# Patient Record
Sex: Male | Born: 1935 | Race: White | Hispanic: No | Marital: Married | Smoking: Never smoker
Health system: Southern US, Community
[De-identification: ages and names within clinical notes are randomized; demographics above are authoritative.]

## PROBLEM LIST (undated history)

## (undated) DIAGNOSIS — S22009A Unspecified fracture of unspecified thoracic vertebra, initial encounter for closed fracture: Secondary | ICD-10-CM

## (undated) DIAGNOSIS — I517 Cardiomegaly: Secondary | ICD-10-CM

## (undated) DIAGNOSIS — I272 Pulmonary hypertension, unspecified: Secondary | ICD-10-CM

## (undated) DIAGNOSIS — R251 Tremor, unspecified: Secondary | ICD-10-CM

## (undated) DIAGNOSIS — I10 Essential (primary) hypertension: Secondary | ICD-10-CM

## (undated) DIAGNOSIS — K2981 Duodenitis with bleeding: Secondary | ICD-10-CM

## (undated) DIAGNOSIS — Z7901 Long term (current) use of anticoagulants: Secondary | ICD-10-CM

## (undated) DIAGNOSIS — IMO0002 Reserved for concepts with insufficient information to code with codable children: Secondary | ICD-10-CM

## (undated) DIAGNOSIS — S129XXA Fracture of neck, unspecified, initial encounter: Secondary | ICD-10-CM

## (undated) DIAGNOSIS — S064X9A Epidural hemorrhage with loss of consciousness of unspecified duration, initial encounter: Secondary | ICD-10-CM

## (undated) DIAGNOSIS — K8020 Calculus of gallbladder without cholecystitis without obstruction: Secondary | ICD-10-CM

## (undated) DIAGNOSIS — I5022 Chronic systolic (congestive) heart failure: Secondary | ICD-10-CM

## (undated) DIAGNOSIS — M1990 Unspecified osteoarthritis, unspecified site: Secondary | ICD-10-CM

## (undated) DIAGNOSIS — K529 Noninfective gastroenteritis and colitis, unspecified: Secondary | ICD-10-CM

## (undated) DIAGNOSIS — I4891 Unspecified atrial fibrillation: Secondary | ICD-10-CM

## (undated) DIAGNOSIS — Z95810 Presence of automatic (implantable) cardiac defibrillator: Secondary | ICD-10-CM

## (undated) HISTORY — PX: NECK SURGERY: SHX720

## (undated) HISTORY — DX: Unspecified osteoarthritis, unspecified site: M19.90

## (undated) HISTORY — PX: PROSTATECTOMY: SHX69

## (undated) HISTORY — DX: Reserved for concepts with insufficient information to code with codable children: IMO0002

## (undated) HISTORY — DX: Essential (primary) hypertension: I10

## (undated) HISTORY — DX: Noninfective gastroenteritis and colitis, unspecified: K52.9

## (undated) HISTORY — PX: COLONOSCOPY: SHX174

---

## 2005-08-28 HISTORY — PX: CARDIAC DEFIBRILLATOR PLACEMENT: SHX171

## 2005-11-07 ENCOUNTER — Encounter: Admission: RE | Admit: 2005-11-07 | Discharge: 2005-11-07 | Payer: Self-pay | Admitting: Orthopaedic Surgery

## 2005-11-15 ENCOUNTER — Encounter: Admission: RE | Admit: 2005-11-15 | Discharge: 2005-11-15 | Payer: Self-pay | Admitting: Orthopaedic Surgery

## 2007-09-16 ENCOUNTER — Ambulatory Visit: Payer: Self-pay | Admitting: Cardiology

## 2013-04-03 ENCOUNTER — Encounter (INDEPENDENT_AMBULATORY_CARE_PROVIDER_SITE_OTHER): Payer: Self-pay | Admitting: *Deleted

## 2013-06-02 ENCOUNTER — Encounter (INDEPENDENT_AMBULATORY_CARE_PROVIDER_SITE_OTHER): Payer: Self-pay | Admitting: Internal Medicine

## 2013-06-02 ENCOUNTER — Ambulatory Visit (INDEPENDENT_AMBULATORY_CARE_PROVIDER_SITE_OTHER): Payer: Medicare Other | Admitting: Internal Medicine

## 2013-06-02 VITALS — BP 114/70 | HR 74 | Temp 96.0°F | Resp 16 | Ht 73.0 in | Wt 180.6 lb

## 2013-06-02 DIAGNOSIS — R197 Diarrhea, unspecified: Secondary | ICD-10-CM

## 2013-06-02 DIAGNOSIS — K529 Noninfective gastroenteritis and colitis, unspecified: Secondary | ICD-10-CM | POA: Insufficient documentation

## 2013-06-02 MED ORDER — LOPERAMIDE HCL 2 MG PO TABS: 2.0000 mg | ORAL_TABLET | Freq: Every day | ORAL | Status: DC

## 2013-06-02 NOTE — Progress Notes (Signed)
Presenting complaint;  Chronic diarrhea.  History of present illness;  Patient's age-year-old Caucasian male who is referred through courtesy of Dr. Sherryll Burger for evaluation of chronic diarrhea. He states he's had this diarrhea for more than 5 years. Every now and then he has performed or normal stool like he had 12 days ago. On most days he is 3 loose stools. He has urgency. He has had at least 3-4 accidents within the last several months. He also has nocturnal bowel movements. He passes stool when he gets up some nights to empty his bladder. He has good appetite and his weight has been stable. He did try probiotic while back and it did not make any difference. His weight has been stable. He has not taken any antibiotics recently. Review of the systems is positive for chronic low back pain.  Current Medications: Current Outpatient Prescriptions  Medication Sig Dispense Refill  . aspirin 81 MG tablet Take 81 mg by mouth daily.      . benazepril (LOTENSIN) 40 MG tablet Take 40 mg by mouth daily.      Marland Kitchen DIGOXIN PO Take 250 mcg by mouth daily.      Marland Kitchen escitalopram (LEXAPRO) 20 MG tablet Take 20 mg by mouth daily.      . metoprolol succinate (TOPROL-XL) 50 MG 24 hr tablet Take 50 mg by mouth daily. Take with or immediately following a meal.      . spironolactone (ALDACTONE) 25 MG tablet Take 25 mg by mouth daily.      Marland Kitchen warfarin (COUMADIN) 5 MG tablet Take 5 mg by mouth daily. Patient states that he takes 5 mg daily except 1 day a week he takes 2 1/2 mg.       No current facility-administered medications for this visit.   Past medical history; -Chronic atrial fibrillation. -AICD implanted about 4 years ago at [facility]. -Hypertension. -History of colonic adenomas. He had an exam at [facility] in 2004. His last colonoscopy was in January 2009 at [facility] by me removal of small cecal polyp which was tubular adenoma. He also had pancolonic diverticulosis. -Random biopsy from sigmoid colon was negative for microscopic  colitis. Procedure was complicated by post-polypectomy bleed within 2 hours of procedure. It turns out he did not stop aspirin and warfarin as recommended. -Bilateral knee osteoarthritis. -History of depression. -History of essential or benign tremor. -Surgery for enlarged prostate while suprapubic approach. -Tonsillectomy. -Vertebroplasty of L1 in March 2007.  Allergies; NKA  Family history; Both parents are deceased. They live to be in their 90s. He has one brother living.  Social history; He is married. 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 He does not smoke cigarettes and drinks 1 can of beer a day. He has two grownup sons in good health.   Physical exam; Blood pressure 114/70, pulse 74, temperature 96 F (35.6 C), temperature source Oral, resp. rate 16, height 6\' 1"  (1.854 m), weight 180 lb 9.6 oz (81.92 kg). Patient is alert and in no acute distress. Conjunctiva is pink. Sclera is nonicteric Oropharyngeal mucosa is normal. No neck masses or thyromegaly noted. Cardiac exam with regular rhythm normal S1 and S2. No murmur or gallop noted. Lungs are clear to auscultation. Abdomen is symmetrical. Bowel sounds are normal. He is small the umbilicus hernia. It is completely reducible. Abdomen is soft and nontender without organomegaly or masses. Rectal examination reveals small amount of stool  in rectal vault it is guaiac negative.  No LE edema or clubbing noted.  Labs/studies Results: Requested from [facility] and Dr. Margaretmary Eddy office.  Assessment:  #1. Chronic diarrhea without the alarm symptoms. Suspect he has irritable bowel syndrome. His last colonoscopy was in January,2009 and biopsy from mucosa of sigmoid colon was unremarkable. He unfortunately is not a candidate for anti-spasmodic therapy. If he does not respond to fiber supplement and low dose Imodium may consider repeat flexible  sigmoidoscopy with biopsy.   Plan:  Fecal lactoferrin. Request recent blood work from [facility] and Dr.Shah,s office. He will need CBC with differential, TSH and dig levels unless these studies have been recently. Imodium OTC 1-2 mg by mouth q. A.m. He will start keeping stool diary the next few weeks. Office visit in 6-8 weeks.

## 2013-06-02 NOTE — Patient Instructions (Addendum)
Take half to one tablet or 1-2 mg of Imodium I mouth every morning. Keep stool diary as to frequency and consistency of stools for the next 4 weeks. Physician will contact her with the results of stool test.

## 2013-06-03 DIAGNOSIS — G8929 Other chronic pain: Secondary | ICD-10-CM | POA: Insufficient documentation

## 2013-06-03 DIAGNOSIS — I4891 Unspecified atrial fibrillation: Secondary | ICD-10-CM | POA: Insufficient documentation

## 2013-06-03 DIAGNOSIS — M170 Bilateral primary osteoarthritis of knee: Secondary | ICD-10-CM | POA: Insufficient documentation

## 2013-06-03 DIAGNOSIS — Z860100 Personal history of colon polyps, unspecified: Secondary | ICD-10-CM | POA: Insufficient documentation

## 2013-06-03 DIAGNOSIS — Z95810 Presence of automatic (implantable) cardiac defibrillator: Secondary | ICD-10-CM | POA: Insufficient documentation

## 2013-06-03 DIAGNOSIS — I10 Essential (primary) hypertension: Secondary | ICD-10-CM | POA: Insufficient documentation

## 2013-06-04 ENCOUNTER — Telehealth (INDEPENDENT_AMBULATORY_CARE_PROVIDER_SITE_OTHER): Payer: Self-pay | Admitting: *Deleted

## 2013-06-04 NOTE — Telephone Encounter (Signed)
Spoke to [place] at [facility], no results found for recent labs, apparently Mr Mark Day didn't have them done -- Lupita Leash is still waiting for labs from [facility]

## 2013-06-04 NOTE — Telephone Encounter (Signed)
Thanks for checking 

## 2013-06-10 ENCOUNTER — Telehealth (INDEPENDENT_AMBULATORY_CARE_PROVIDER_SITE_OTHER): Payer: Self-pay | Admitting: *Deleted

## 2013-06-10 NOTE — Telephone Encounter (Signed)
Mr. Mark Day came to office this afternoon, he was taking his stool to be tested for Fecal Lactoferrin. He is currently taking Imodium OTC 2 mg -Takes 1 by mout every morning. He states that he has been overly cured. Since his OV 06/02/13 , he has had 2 bowel movements. The first he stated was with great difficulty and little effect. The second one was very difficult and it was 3 days ago. Patient was advised that Dr.Rehman would be made aware and that I would call him back  with his recommendation.

## 2013-06-12 NOTE — Telephone Encounter (Signed)
Dr.Rehman states that he is going to call the patient.

## 2013-06-13 ENCOUNTER — Telehealth (INDEPENDENT_AMBULATORY_CARE_PROVIDER_SITE_OTHER): Payer: Self-pay | Admitting: *Deleted

## 2013-06-13 NOTE — Telephone Encounter (Signed)
What he spoke with Tammy about the other day still exist and needs speak with her or Dr. Karilyn Cota. "Hope he doesn't die before Monday." His return phone number is [phone].

## 2013-06-16 NOTE — Telephone Encounter (Signed)
Patient's call returned. Records obtained from [facility] but no recent blood work done. Prior colonoscopy records also reviewed. He was having more or less similar symptoms then also. Patient advised to take 1 mg of Imodium every morning and to Colace tablets every night.

## 2013-06-16 NOTE — Telephone Encounter (Signed)
Dr.Rehman was made aware of this , and he plans to call Mr. Eddinger. Forwarded to Dr.Rehman as a reminder

## 2013-07-15 NOTE — Progress Notes (Signed)
Has an apt on 07/29/13 with Dr. Karilyn Cota.

## 2013-07-29 ENCOUNTER — Ambulatory Visit (INDEPENDENT_AMBULATORY_CARE_PROVIDER_SITE_OTHER): Payer: Medicare Other | Admitting: Internal Medicine

## 2013-07-29 ENCOUNTER — Encounter (INDEPENDENT_AMBULATORY_CARE_PROVIDER_SITE_OTHER): Payer: Self-pay | Admitting: Internal Medicine

## 2013-07-29 VITALS — BP 102/68 | HR 70 | Temp 96.8°F | Resp 16 | Ht 73.0 in | Wt 181.1 lb

## 2013-07-29 DIAGNOSIS — K589 Irritable bowel syndrome without diarrhea: Secondary | ICD-10-CM

## 2013-07-29 DIAGNOSIS — K529 Noninfective gastroenteritis and colitis, unspecified: Secondary | ICD-10-CM

## 2013-07-29 DIAGNOSIS — R197 Diarrhea, unspecified: Secondary | ICD-10-CM

## 2013-07-29 MED ORDER — LOPERAMIDE HCL 2 MG PO TABS: 2.0000 mg | ORAL_TABLET | Freq: Every day | ORAL | Status: DC | PRN

## 2013-07-29 MED ORDER — DOCUSATE SODIUM 100 MG PO CAPS: 200.0000 mg | ORAL_CAPSULE | Freq: Every day | ORAL | Status: DC

## 2013-07-29 NOTE — Patient Instructions (Signed)
High fiber diet. Use Imodium OTC 1-2 mg on as-needed basis. Can use Dulcolax suppository one as-needed basis(if no bowel movement in 2 days).

## 2013-07-29 NOTE — Progress Notes (Signed)
Presenting complaint;  Followup for irregular bowel habits.  Subjective:  Patient is 79 year old Caucasian male who was seen on 06/02/2013 for chronic diarrhea. He was felt to have irritable bowel syndrome. He was advised to take Imodium 2 mg every morning and continue with high fiber diet. Patient calls few days later stating that he was constipated. He was advised to drop Imodium dose to 1 mg daily. He thought it was not working and therefore stopped it. However he feels much better. He did not keep stool diary but he states he has had one accident since his last visit. He is having less diarrhea and more constipation. At times he goes 2 days without a bowel movement and then has to strain. His appetite remains good and he denies melena or rectal bleeding.  Current Medications: Current Outpatient Prescriptions  Medication Sig Dispense Refill  . aspirin 81 MG tablet Take 81 mg by mouth daily.      . benazepril (LOTENSIN) 40 MG tablet Take 40 mg by mouth daily.      Marland Kitchen DIGOXIN PO Take 250 mcg by mouth daily.      Marland Kitchen escitalopram (LEXAPRO) 20 MG tablet Take 20 mg by mouth daily.      Marland Kitchen FLUVIRIN INJ injection Inject into the muscle once.       . loperamide (IMODIUM A-D) 2 MG tablet Take 1 tablet (2 mg total) by mouth daily at 6 (six) AM. Take half to 1 tablet by mouth every morning to  30 tablet  0  . metoprolol succinate (TOPROL-XL) 50 MG 24 hr tablet Take 50 mg by mouth daily. Take with or immediately following a meal.      . spironolactone (ALDACTONE) 25 MG tablet Take 25 mg by mouth daily.      Marland Kitchen warfarin (COUMADIN) 5 MG tablet Take 5 mg by mouth daily. Patient states that he takes 5 mg daily except 1 day a week he takes 2 1/2 mg.      . ZOSTAVAX 16109 UNT/0.65ML injection Inject 0.65 mLs into the skin once.        No current facility-administered medications for this visit.     Objective: Blood pressure 102/68, pulse 70, temperature 96.8 F (36 C), temperature source Oral, resp. rate 16,  height 6\' 1"  (1.854 m), weight 181 lb 1.6 oz (82.146 kg). Patient is alert and in no acute distress. Conjunctiva is pink. Sclera is nonicteric Oropharyngeal mucosa is normal. No neck masses or thyromegaly noted. Abdomen symmetrical soft and nontender without organomegaly or masses.  No LE edema or clubbing noted.  Labs/studies Results: Lab data from 1920 10/16/2012 reviewed [facility]). Electrolytes within normal limits. BUN 21 and creatinine 1.2 and calcium 10.0 Fecal lactoferrin on 07/19/2013 was negative.    Assessment:  #1. Irritable bowel syndrome. Patient has typical symptoms of IBS. He actually reported identical symptoms prior to his colonoscopy of January 2009 revealing pancolonic diverticulosis and small cecal tubular adenoma. Biopsy from sigmoid colon was negative for microscopic colitis. On his last visit he was having diarrhea and urgency and now he is prone to constipation. His weight is stable. At this point I do not feel he needs colonoscopy    Plan:  Once again patient advised to eat fiber rich foods at each meal. Colace 200 mg by mouth daily. Use Imodium OTC 1-2 mg only on days when he has diarrhea. Dulcolax suppository on as-needed basis. Office visit in 6 months.

## 2013-08-13 ENCOUNTER — Encounter (INDEPENDENT_AMBULATORY_CARE_PROVIDER_SITE_OTHER): Payer: Self-pay

## 2013-10-26 DIAGNOSIS — IMO0002 Reserved for concepts with insufficient information to code with codable children: Secondary | ICD-10-CM

## 2013-10-26 HISTORY — DX: Reserved for concepts with insufficient information to code with codable children: IMO0002

## 2013-11-03 ENCOUNTER — Other Ambulatory Visit (HOSPITAL_COMMUNITY): Payer: Self-pay | Admitting: Sports Medicine

## 2013-11-03 DIAGNOSIS — M810 Age-related osteoporosis without current pathological fracture: Secondary | ICD-10-CM

## 2013-11-17 ENCOUNTER — Other Ambulatory Visit (HOSPITAL_COMMUNITY): Payer: Medicare Other

## 2014-01-27 ENCOUNTER — Ambulatory Visit (INDEPENDENT_AMBULATORY_CARE_PROVIDER_SITE_OTHER): Payer: Medicare Other | Admitting: Internal Medicine

## 2014-01-27 ENCOUNTER — Encounter (INDEPENDENT_AMBULATORY_CARE_PROVIDER_SITE_OTHER): Payer: Self-pay | Admitting: Internal Medicine

## 2014-01-27 VITALS — BP 98/70 | HR 68 | Temp 96.9°F | Resp 16 | Ht 73.0 in | Wt 169.3 lb

## 2014-01-27 DIAGNOSIS — K589 Irritable bowel syndrome without diarrhea: Secondary | ICD-10-CM

## 2014-01-27 NOTE — Patient Instructions (Signed)
Can take Imodium OTC 1 mg daily as needed.

## 2014-01-27 NOTE — Progress Notes (Addendum)
Presenting complaint;  Followup for irregular bowel habit/diarrhea.  Subjective:  Patient is 80 year old Caucasian male who was initially seen for bowel problems in October 2014 and felt to have irritable bowel syndrome. He was doing well when he was seen 6 months ago. He states he is doing well from GI standpoint but he sustained compression fracture to one of the vertebra and has been experiencing pain. He also developed anorexia and weight loss. He has lost 12 pounds since his last visit but he believes he had lost more than that and he has gained a few pounds back. He took hydrocodone which resulted in constipation and he stopped this medication. His appetite is fair. He is trying to eat fiber rich foods. He generally passes formed stool in the centesis. He tries to have a bowel movement every time he voids. He does not have nocturnal bowel movements. He has not experienced any accidents or abdominal pain. He also complains of nocturia. He has to get up at least 4 times every night. However he states he drinks too much water. He says he does not have prostate as it was removed surgically at the [facility] several years ago.  Current Medications: Outpatient Encounter Prescriptions as of 01/27/2014  Medication Sig  . aspirin 81 MG tablet Take 81 mg by mouth daily.  Marland Kitchen escitalopram (LEXAPRO) 20 MG tablet Take 20 mg by mouth daily.  Marland Kitchen FLUVIRIN INJ injection Inject into the muscle once.   . metoprolol succinate (TOPROL-XL) 50 MG 24 hr tablet Take 50 mg by mouth daily. Take with or immediately following a meal.  . TOBRADEX ophthalmic ointment Place 1 application into both eyes 2 (two) times daily. Patient states that he mostly does this once a day.  . warfarin (COUMADIN) 5 MG tablet Take 5 mg by mouth daily. Patient states that he takes 5 mg daily except on Monday and Friday , he takes 7.5 mg  . ZOSTAVAX 45997 UNT/0.65ML injection Inject 0.65 mLs into the skin once.   . docusate sodium (COLACE) 100 MG  capsule Take 2 capsules (200 mg total) by mouth daily.  Marland Kitchen loperamide (IMODIUM A-D) 2 MG tablet Take 1 tablet (2 mg total) by mouth daily as needed for diarrhea or loose stools. Take half to 1 tablet by mouth every morning to  . spironolactone (ALDACTONE) 25 MG tablet Take 25 mg by mouth daily.  . [DISCONTINUED] atorvastatin (LIPITOR) 20 MG tablet   . [DISCONTINUED] benazepril (LOTENSIN) 40 MG tablet Take 40 mg by mouth daily.  . [DISCONTINUED] DIGOXIN PO Take 250 mcg by mouth daily.     Objective: Blood pressure 98/70, pulse 68, temperature 96.9 F (36.1 C), temperature source Oral, resp. rate 16, height 6\' 1"  (1.854 m), weight 169 lb 4.8 oz (76.794 kg). Patient is alert and in no acute distress. He was able to move slowly from chair to examination table. Conjunctiva is pink. Sclera is nonicteric Oropharyngeal mucosa is normal. No neck masses or thyromegaly noted. Abdomen symmetrical soft and nontender without organomegaly or masses.  No LE edema or clubbing noted.     Assessment:  #1. Irritable bowel syndrome. He is still having 3-4 stools a day but most of his stools are formed or semi-formed. He does not have a lot of symptoms. #2. Weight loss. Weight loss may be related to acute back he has gained some weight back. #3. Nocturia.   Plan:  Continue high fiber diet. Imodium OTC 1 mg by mouth daily when necessary. Use glycerin or  Dulcolax suppository for constipation but did not to take by mouth laxative. Patient advised to followup with PCP regarding nocturia. Office visit on as-needed basis.

## 2014-03-19 ENCOUNTER — Institutional Professional Consult (permissible substitution): Payer: Medicare Other | Admitting: Internal Medicine

## 2014-03-20 ENCOUNTER — Institutional Professional Consult (permissible substitution): Payer: Medicare Other | Admitting: Internal Medicine

## 2014-04-28 DIAGNOSIS — S22009A Unspecified fracture of unspecified thoracic vertebra, initial encounter for closed fracture: Secondary | ICD-10-CM

## 2014-04-28 DIAGNOSIS — S064X9A Epidural hemorrhage with loss of consciousness of unspecified duration, initial encounter: Secondary | ICD-10-CM

## 2014-04-28 DIAGNOSIS — I272 Pulmonary hypertension, unspecified: Secondary | ICD-10-CM

## 2014-04-28 DIAGNOSIS — S064XAA Epidural hemorrhage with loss of consciousness status unknown, initial encounter: Secondary | ICD-10-CM

## 2014-04-28 DIAGNOSIS — I517 Cardiomegaly: Secondary | ICD-10-CM

## 2014-04-28 DIAGNOSIS — I5022 Chronic systolic (congestive) heart failure: Secondary | ICD-10-CM

## 2014-04-28 DIAGNOSIS — S129XXA Fracture of neck, unspecified, initial encounter: Secondary | ICD-10-CM

## 2014-04-28 HISTORY — DX: Epidural hemorrhage with loss of consciousness of unspecified duration, initial encounter: S06.4X9A

## 2014-04-28 HISTORY — DX: Chronic systolic (congestive) heart failure: I50.22

## 2014-04-28 HISTORY — DX: Unspecified fracture of unspecified thoracic vertebra, initial encounter for closed fracture: S22.009A

## 2014-04-28 HISTORY — DX: Fracture of neck, unspecified, initial encounter: S12.9XXA

## 2014-04-28 HISTORY — DX: Pulmonary hypertension, unspecified: I27.20

## 2014-04-28 HISTORY — DX: Cardiomegaly: I51.7

## 2014-04-28 HISTORY — DX: Epidural hemorrhage with loss of consciousness status unknown, initial encounter: S06.4XAA

## 2014-06-01 ENCOUNTER — Inpatient Hospital Stay
Admission: RE | Admit: 2014-06-01 | Discharge: 2014-06-06 | Disposition: A | Discharge status: Other Acute Inpatient Hospital | Payer: Medicare Other | Source: Ambulatory Visit | Attending: Internal Medicine | Admitting: Internal Medicine

## 2014-06-03 ENCOUNTER — Non-Acute Institutional Stay (SKILLED_NURSING_FACILITY): Payer: Medicare Other | Admitting: Internal Medicine

## 2014-06-03 DIAGNOSIS — S064X0D Epidural hemorrhage without loss of consciousness, subsequent encounter: Secondary | ICD-10-CM

## 2014-06-03 DIAGNOSIS — R29898 Other symptoms and signs involving the musculoskeletal system: Secondary | ICD-10-CM

## 2014-06-03 DIAGNOSIS — I071 Rheumatic tricuspid insufficiency: Secondary | ICD-10-CM

## 2014-06-03 DIAGNOSIS — I4819 Other persistent atrial fibrillation: Secondary | ICD-10-CM

## 2014-06-03 DIAGNOSIS — I481 Persistent atrial fibrillation: Secondary | ICD-10-CM

## 2014-06-06 ENCOUNTER — Emergency Department (HOSPITAL_COMMUNITY)
Admission: EM | Admit: 2014-06-06 | Discharge: 2014-06-06 | Disposition: A | Discharge status: Home / Self Care | Payer: Medicare Other | Attending: Emergency Medicine | Admitting: Emergency Medicine

## 2014-06-06 ENCOUNTER — Encounter (HOSPITAL_COMMUNITY): Payer: Self-pay | Admitting: Emergency Medicine

## 2014-06-06 ENCOUNTER — Inpatient Hospital Stay
Admission: RE | Admit: 2014-06-06 | Discharge: 2014-07-09 | Disposition: A | Discharge status: Home / Self Care | Payer: Medicare Other | Source: Ambulatory Visit | Attending: Internal Medicine | Admitting: Internal Medicine

## 2014-06-06 DIAGNOSIS — M1990 Unspecified osteoarthritis, unspecified site: Secondary | ICD-10-CM | POA: Insufficient documentation

## 2014-06-06 DIAGNOSIS — R1310 Dysphagia, unspecified: Principal | ICD-10-CM

## 2014-06-06 DIAGNOSIS — Z79899 Other long term (current) drug therapy: Secondary | ICD-10-CM | POA: Insufficient documentation

## 2014-06-06 DIAGNOSIS — I10 Essential (primary) hypertension: Secondary | ICD-10-CM | POA: Insufficient documentation

## 2014-06-06 DIAGNOSIS — Z7901 Long term (current) use of anticoagulants: Secondary | ICD-10-CM | POA: Insufficient documentation

## 2014-06-06 DIAGNOSIS — Z8781 Personal history of (healed) traumatic fracture: Secondary | ICD-10-CM | POA: Diagnosis not present

## 2014-06-06 DIAGNOSIS — R040 Epistaxis: Secondary | ICD-10-CM | POA: Insufficient documentation

## 2014-06-06 DIAGNOSIS — Z7982 Long term (current) use of aspirin: Secondary | ICD-10-CM | POA: Diagnosis not present

## 2014-06-06 LAB — PROTIME-INR
INR: 2.17 — AB (ref 0.00–1.49)
PROTHROMBIN TIME: 24.2 s — AB (ref 11.6–15.2)

## 2014-06-06 MED ORDER — OXYMETAZOLINE HCL 0.05 % NA SOLN
1.0000 | Freq: Once | NASAL | Status: AC
  Administered 2014-06-06: 1 via NASAL
  Filled 2014-06-06: qty 15

## 2014-06-06 NOTE — Discharge Instructions (Signed)
NOsebleed resolved on it's own.  He will be given Afrin if he has recurrent bleed. You should be placed on humidified oxygen.  Nosebleed A nosebleed can be caused by many things, including:  Getting hit hard in the nose.  Infections.  Dry nose.  Colds.  Medicines. Your doctor may do lab testing if you get nosebleeds a lot and the cause is not known. HOME CARE   If your nose was packed with material, keep it there until your doctor takes it out. Put the pack back in your nose if the pack falls out.  Do not blow your nose for 12 hours after the nosebleed.  Sit up and bend forward if your nose starts bleeding again. Pinch the front half of your nose nonstop for 20 minutes.  Put petroleum jelly inside your nose every morning if you have a dry nose.  Use a humidifier to make the air less dry.  Do not take aspirin.  Try not to strain, lift, or bend at the waist for many days after the nosebleed. GET HELP RIGHT AWAY IF:   Nosebleeds keep happening and are hard to stop or control.  You have bleeding or bruises that are not normal on other parts of the body.  You have a fever.  The nosebleeds get worse.  You get lightheaded, feel faint, sweaty, or throw up (vomit) blood. MAKE SURE YOU:   Understand these instructions.  Will watch your condition.  Will get help right away if you are not doing well or get worse. Document Released: 05/23/2008 Document Revised: 11/06/2011 Document Reviewed: 05/23/2008 [facility] Patient Information 2015 [employer], [place]. This information is not intended to replace advice given to you by your health care provider. Make sure you discuss any questions you have with your health care provider.

## 2014-06-06 NOTE — ED Notes (Signed)
Pt from [facility] reportedly with a nose bleed that started approx 3 am that apparently lasted about 30 minutes. Pt is on coumadin and has had nosebleeds in the past.  Bleeding has stopped at this time.

## 2014-06-06 NOTE — ED Provider Notes (Signed)
CSN: [id number]     Arrival date & time 06/06/14  [id number] History   First MD Initiated Contact with Patient 06/06/14 0404     Chief Complaint  Patient presents with  . Epistaxis     (Consider location/radiation/quality/duration/timing/severity/associated sxs/prior Treatment) HPI  This is an 80 year old male who [facility] who presents with epistaxis. Patient is on Coumadin. He is Chinarehabbing following a fall. Was noted to have epistaxis from the left may or. He is on 2 L of oxygen which is not been humidified. Bleeding under control prior to arrival. Patient has no other complaints.  Past Medical History  Diagnosis Date  . Osteoarthritis   . Chronic diarrhea   . Hypertension   . Fracture of vertebra 10/2013    Lumbar   Past Surgical History  Procedure Laterality Date  . Prostatectomy    . Colonoscopy     Family History  Problem Relation Age of Onset  . Healthy Brother   . Healthy Son   . Healthy Son    History  Substance Use Topics  . Smoking status: Never Smoker   . Smokeless tobacco: Never Used  . Alcohol Use: Yes     Comment: Patient states that he drinks a bottle of beer every night.    Review of Systems  Constitutional: Negative.  Negative for fever.  HENT: Positive for nosebleeds.   Respiratory: Negative.  Negative for chest tightness and shortness of breath.   Cardiovascular: Negative.  Negative for chest pain.  Gastrointestinal: Negative.   Genitourinary: Negative.  Negative for dysuria.  Neurological: Negative for headaches.  All other systems reviewed and are negative.     Allergies  Review of patient's allergies indicates no known allergies.  Home Medications   Prior to Admission medications   Medication Sig Start Date End Date Taking? Authorizing Provider  aspirin 81 MG tablet Take 81 mg by mouth daily.   Yes Historical Provider, MD  atorvastatin (LIPITOR) 10 MG tablet Take 10 mg by mouth daily.   Yes Historical Provider, MD   escitalopram (LEXAPRO) 20 MG tablet Take 20 mg by mouth daily.   Yes Historical Provider, MD  lisinopril (PRINIVIL,ZESTRIL) 10 MG tablet Take 10 mg by mouth daily.   Yes Historical Provider, MD  metoprolol succinate (TOPROL-XL) 50 MG 24 hr tablet Take 50 mg by mouth daily. Take with or immediately following a meal.   Yes Historical Provider, MD  traMADol (ULTRAM) 50 MG tablet Take by mouth every 6 (six) hours as needed.   Yes Historical Provider, MD  warfarin (COUMADIN) 5 MG tablet Take 7.5 mg by mouth daily. Patient states that he takes 5 mg daily except on Monday and Friday , he takes 7.5 mg   Yes Historical Provider, MD  docusate sodium (COLACE) 100 MG capsule Take 2 capsules (200 mg total) by mouth daily. 07/29/13   Malissa HippoNajeeb U Rehman, MD  FLUVIRIN INJ injection Inject into the muscle once.  06/27/13   Historical Provider, MD  TOBRADEX ophthalmic ointment Place 1 application into both eyes 2 (two) times daily. Patient states that he mostly does this once a day. 01/14/14   Historical Provider, MD  ZOSTAVAX [id number] UNT/0.65ML injection Inject 0.65 mLs into the skin once.  06/27/13   Historical Provider, MD   BP 133/79  Pulse 86  Temp(Src) 98 F (36.7 C) (Oral)  Resp 20  SpO2 95% Physical Exam  Nursing note and vitals reviewed. Constitutional: He is oriented to person, place, and time. No  distress.  Elderly  HENT:  Head: Normocephalic.  Mouth/Throat: Oropharynx is clear and moist.  Abrasion over the occiput, dressed and healing, dry blood and clot noted in the left nares. Clot evacuated and patient given afrin. No significant friable tissue noted  Eyes: Pupils are equal, round, and reactive to light.  Neck:  C. collar in place  Cardiovascular: Normal rate and regular rhythm.   Pulmonary/Chest: Effort normal. No respiratory distress.  Musculoskeletal: He exhibits no edema.  Back brace in place  Neurological: He is alert and oriented to person, place, and time.  Skin: Skin is warm and dry.   Psychiatric: He has a normal mood and affect.    ED Course  Procedures (including critical care time) Labs Review Labs Reviewed  PROTIME-INR - Abnormal; Notable for the following:    Prothrombin Time 24.2 (*)    INR 2.17 (*)    All other components within normal limits    Imaging Review No results found.   EKG Interpretation None      MDM   Final diagnoses:  Epistaxis    Patient presents with epistaxis. Largely resolved prior to arrival. Clot was evacuated from left nare the patient was given Afrin with continued hemostasis.  PT is therapeutic. Suspect patient's nosebleeds likely secondary to this Coumadin use and having an humidified oxygen. Will request humidified oxygen at his facility.  After history, exam, and medical workup I feel the patient has been appropriately medically screened and is safe for discharge home. Pertinent diagnoses were discussed with the patient. Patient was given return precautions.    Shon Batonourtney F Jinna Weinman, MD 06/06/14 [phone]

## 2014-06-06 NOTE — ED Notes (Signed)
No bleeding from nose at this time 

## 2014-06-08 NOTE — Progress Notes (Addendum)
Patient ID: Mark Day, male   DOB: 06/30/1923, 80 y.o.   MRN: [id number]               HISTORY & PHYSICAL  DATE:  06/03/2014    FACILITY: [facility]    LEVEL OF CARE:   SNF   CHIEF COMPLAINT:  Admission to SNF, post stay at [facility], 05/25/2014 through 05/31/2014.    HISTORY OF PRESENT ILLNESS:  This is an 80 year-old man who lives with his wife in [place], [place].  He has a history of falls.  In fact, he states he has had multiple falls.  He fell "after his legs gave out from underneath him".   He fell backwards, hit his head on the carpet.  He was seen in [facility] for a hematoma, concern for left parietal fractures.    He was transferred to [facility].  There, a CT scan of the C-spine was concerning for cervical and thoracic fractures.  He was noted to have an epidural hematoma of the left parietal region, which was managed nonoperatively.    Cardiology was consulted for syncope with an echo finding of dilated right atrium and left atrium.    He developed herpes zoster while in hospital and completed a course of Valtrex.    He remains in a CTL SO brace for his cervical neck fracture.    The patient was started back on Coumadin with neuro Surgery's blessing.    He was also noted to be fluid-overloaded with pleural effusions, which were diuresed.  I have no more information than this.    PAST MEDICAL HISTORY/PROBLEM LIST:     Atrial fibrillation.  On Coumadin, with a pacemaker.    Hypertension.    Prior compression fractures, treated with kyphoplasty.    Hyperlipidemia.    History of macrocytic anemia.    CURRENT MEDICATIONS:  Discharge medications include:       Lipitor 20 q.d.    ASA 81 q.d.     Lexapro 20 q.d.    Metoprolol 50 b.i.d.    Coumadin 5 mg except two days a week where he takes 7.5.    Valtrex 1000 three times daily.    SOCIAL HISTORY:   HOUSING:  The patient lives in [place]  with his wife.  Per the nursing staff, his wife is in a wheelchair.   FUNCTIONAL STATUS:  The patient used a cane.  I do not have a clear sense of his independence level exactly.    REVIEW OF SYSTEMS:   HEENT:  Denies headache.   CHEST/RESPIRATORY:  No clear shortness of breath.   CARDIAC:   No chest pain.   GI:  No nausea or vomiting, or bowel changes.   GU:  He apparently has incontinence and he wears a condom catheter, which I will try to do just at night.   SKIN:  Although he has what appears to be healed zoster over his right anterior thigh, he denies any pain or issues here.   NEUROLOGICAL:   He is not able to give me a clear sense of his falling, although with this one he clearly fell backwards (retropulses).    PHYSICAL EXAMINATION:   VITAL SIGNS:   O2 SATURATIONS:   95% on 2 L.   RESPIRATIONS:  18 and unlabored.   PULSE:  67 and irregular.   GENERAL APPEARANCE:  The patient is lying in bed, in no distress.  Alert, conversational.  CHEST/RESPIRATORY:  Clear air entry bilaterally.   CARDIOVASCULAR:  CARDIAC:  There is a 3/6 pansystolic murmur over the lower left sternal border.  JVP is elevated.  There is no S3.   GASTROINTESTINAL:  LIVER/SPLEEN/KIDNEYS:  No liver, no spleen.  No tenderness.   GENITOURINARY:  BLADDER:   No suprapubic fullness, tenderness, or costovertebral angle tenderness.    SKIN:  INSPECTION:  He has apparently a laceration over his occiput with a foam dressing over this.  I did not look at this.  He has healing scaled lesions with his resolving zoster on the right anterior thigh.   NEUROLOGICAL:    CRANIAL NERVES:  Cranial nerves are normal as far as I can tell.  He has tremors of his left greater than right arm, both at rest and with activity.  These are worse with activity.  I think this is probably essential, although at rest it would be easy to be fooled that this was a Parkinson's tremor.   SENSATION/STRENGTH:  He has good upper extremity function.  Able  to move both his legs antigravity.  He does not have any upper motor neuron signs in his arms.  He has wasting at the quads, but no fasciculations are seen.       DEEP TENDON REFLEXES:  Reflexes absent at the knee and ankle jerks.  Both his toes are upgoing.   PSYCHIATRIC:   MENTAL STATUS:   He was able to give me a fairly good account of himself, although he could not really tell me whether he was on oxygen prior to his hospitalization.    ASSESSMENT/PLAN:  Status post fall with epidural hematoma, spinal fracture in the cervical spine for which he has a long brace (CTL SO brace).    History of AFib.  On Coumadin.  This has been restarted.  His INR is 1.46.  I am not going to put him on both Lovenox and Coumadin as suggested in the discharge summary.  The risk here is simply too high.  He is on Coumadin 7.5 a day at this point.  His INR yesterday was 1.46.  His heart rate is controlled with regards to his atrial fibrillation.    Right atrial and left atrial dilatation listed on an echocardiogram, although I do not actually see this result.  It may be that I can get on Care Anywhere.  However, I have not done this today.  His jugular venous pressure is elevated.  However, there is no obvious congestive heart failure, either right or left ventricular.  I have made no adjustments to his medications.    Lower extremity weakness with bilateral upgoing toes.  Bilateral upgoing toes in the absence of other findings always makes me wonder about cervical spine, either related to his fracture or previous issues.    Probable tricuspid regurgitation based on clinical exam.  Once again, I need to review his echo.  He may have elevated right-sided heart pressures accounting for his elevated JVP.    The patient actually looks a lot better on exam than I would have thought, looking at his hospitalization.  He will need PT and OT.  I will follow his Coumadin, lab work.       CPT CODE: [id number]

## 2014-06-09 ENCOUNTER — Non-Acute Institutional Stay (SKILLED_NURSING_FACILITY): Payer: Medicare Other | Admitting: Internal Medicine

## 2014-06-09 DIAGNOSIS — F05 Delirium due to known physiological condition: Secondary | ICD-10-CM

## 2014-06-09 DIAGNOSIS — I482 Chronic atrial fibrillation, unspecified: Secondary | ICD-10-CM

## 2014-06-09 DIAGNOSIS — S064XAA Epidural hemorrhage with loss of consciousness status unknown, initial encounter: Secondary | ICD-10-CM

## 2014-06-09 DIAGNOSIS — S064X9A Epidural hemorrhage with loss of consciousness of unspecified duration, initial encounter: Secondary | ICD-10-CM

## 2014-06-09 DIAGNOSIS — I621 Nontraumatic extradural hemorrhage: Secondary | ICD-10-CM

## 2014-06-09 NOTE — Progress Notes (Signed)
Patient ID: Mark Day, male   DOB: 05/14/1923, 80 y.o.   MRN: [id number]     FACILITY: [facility]  LEVEL OF CARE: SNF This is an acute visit   CHIEF COMPLAINT: Acute visit secondary to confusion   HISTORY OF PRESENT ILLNESS: This is an 80 year-old man who lives with his wife in [place], [place]. He has a history of falls. In fact,s he has had multiple falls. He fell "after his legs gave out from underneath him". He fell backwards, hit his head on the carpet. He was [facility] for a hematoma, concern for left parietal fractures.  He was [facility]. There, a CT scan of the C-spine was concerning for cervical and thoracic fractures. He was noted to have an epidural hematoma of the left parietal region, which was managed nonoperatively.  Cardiology was consulted for syncope with an echo finding of dilated right atrium and left atrium.  He developed herpes zoster while in hospital and completed a course of Valtrex.  He remains in a CTL SO brace for his cervical neck fracture.  The patient was started back on Coumadin with neuro Surgery's blessing.  He was also noted to be fluid-overloaded with pleural effusions, which were diuresed.  Apparently patient is having some increased confusion this is at night essentially.  Apparently last night he was in the bathroom stating he was trying to escape-apparently this is intermittent confusion and confined largely to the nighttime hours.   there is some thought that tramadol may be contributing to this at night.  He has been seen by psychiatric services and they have started him on low dose Xanax 0.125 mg every 6 hours when necessary  Today appears to be at his baseline-neurologically he is intact his speech is clear quickwitted- he is pleasant appropriate.  PAST MEDICAL HISTORY/PROBLEM LIST:  Atrial fibrillation. On Coumadin, with a pacemaker.  Hypertension.  Prior compression  fractures, treated with kyphoplasty.  Hyperlipidemia.  History of macrocytic anemia.  CURRENT MEDICATIONS: Discharge medications include:  Lipitor 20 q.d.  ASA 81 q.d.  Lexapro 20 q.d.  Metoprolol 50 b.i.d.  Coumadin 5 mg except two days a week where he takes 7.5.  Valtrex 1000 three times daily.  SOCIAL HISTORY:  HOUSING: The patient lives in [place] with his wife. Per the nursing staff, his wife is in a wheelchair. 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  REVIEW OF SYSTEMS No no complaints of fever chills:  HEENT: Denies headache.  CHEST/RESPIRATORY: No clear shortness of breath.  CARDIAC: No chest pain.  GI: No nausea or vomiting, or bowel changes.  GU: He apparently has incontinencet.  SKIN: Although he has what appears to be healed zoster over his right anterior thigh, he denies any pain or issues here.  NEUROLOGICAL:  Not complain of dizziness headache or syncopal-type episode    PHYSICAL EXAMINATION:   Temperature 97.9 pulse 86 respirations 20 blood pressure 128/85    GENERAL APPEARANCE: Patient is sitting in a chair  Alert, conversational.  CHEST/RESPIRATORY: Clear air entry bilaterally.  CARDIOVASCULAR:  CARDIAC: There is a 3/6 pansystolic murmur --irregular irregular rate and rhythm   GASTROINTESTINAL:  Abdomen is soft nontender with positive bowel sounds   Muscle skeletal -- is able to move all extremities x4-he does have a neck brace in place .  SKIN:  INSPECTION:  . He has healing scaled lesions with his resolving zoster  on the right anterior thigh.  NEUROLOGICAL:  CRANIAL NERVES: Cranial nerves are normal as far as I can tell. He has tremors of his left greater than right arm, both at rest and with activity. These are worse with activity. I think this is probably essential, .  SENSATION/STRENGTH: He has good upper extremity function. Able to move both his legs antigravity. He does not have any upper motor neuron signs in his  arms. 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  Labs.  06/07/2014 INR 2.35.  06/05/2014.  WBC 6.1 hemoglobin 11.0 platelets 203.  Sodium 141 potassium 3.9 BUN 23 creatinine 0.92    .  ASSESSMENT/PLAN:  Status post fall with epidural hematoma, spinal fracture in the cervical spine for which he has a long brace (CTL SO brace).--Appears to be at baseline  History of AFib. On Coumadin. This has been restarted. His INR is 2.35. I . His heart rate is controlled with regards to his atrial fibrillation.  Right atrial and left atrial dilatation listed on an echocardiogram, although I do not actually see this result. I , there is no obvious congestive heart failure, .   . Confusion-this appears to be limited to the nighttime hours-will discontinue his Ultraml to see if this helps-I do not really see any evidence of increased confusion this afternoon certainly-I do not see concerning neurologic findings.  At this point we'll monitor he did have a urine collected recently which was negative.  He has been started on low dose Xanax when necessary by psychiatry    [phone]-of note greater than 25 minutes spent assessing patient-discussing his status with nursing quite extensively including coordinating her plan of care- of note greater than 50% of time spent coordinating plan of care

## 2014-06-14 ENCOUNTER — Encounter: Payer: Self-pay | Admitting: Internal Medicine

## 2014-06-14 DIAGNOSIS — S064X9A Epidural hemorrhage with loss of consciousness of unspecified duration, initial encounter: Secondary | ICD-10-CM | POA: Insufficient documentation

## 2014-06-14 DIAGNOSIS — S064XAA Epidural hemorrhage with loss of consciousness status unknown, initial encounter: Secondary | ICD-10-CM | POA: Insufficient documentation

## 2014-06-24 ENCOUNTER — Ambulatory Visit (INDEPENDENT_AMBULATORY_CARE_PROVIDER_SITE_OTHER): Payer: Medicare Other | Admitting: Internal Medicine

## 2014-06-24 ENCOUNTER — Encounter: Payer: Self-pay | Admitting: Internal Medicine

## 2014-06-24 ENCOUNTER — Encounter: Payer: Self-pay | Admitting: *Deleted

## 2014-06-24 VITALS — BP 116/58 | HR 72

## 2014-06-24 DIAGNOSIS — I255 Ischemic cardiomyopathy: Secondary | ICD-10-CM

## 2014-06-24 DIAGNOSIS — I5022 Chronic systolic (congestive) heart failure: Secondary | ICD-10-CM

## 2014-06-24 DIAGNOSIS — Z95810 Presence of automatic (implantable) cardiac defibrillator: Secondary | ICD-10-CM

## 2014-06-24 DIAGNOSIS — I5023 Acute on chronic systolic (congestive) heart failure: Secondary | ICD-10-CM | POA: Insufficient documentation

## 2014-06-24 DIAGNOSIS — I480 Paroxysmal atrial fibrillation: Secondary | ICD-10-CM

## 2014-06-24 DIAGNOSIS — I509 Heart failure, unspecified: Secondary | ICD-10-CM

## 2014-06-24 LAB — MDC_IDC_ENUM_SESS_TYPE_INCLINIC
Date Time Interrogation Session: 20151028111936
HIGH POWER IMPEDANCE MEASURED VALUE: 46 Ohm
HighPow Impedance: 37 Ohm
Lead Channel Pacing Threshold Amplitude: 0.5 V
Lead Channel Pacing Threshold Pulse Width: 0.5 ms
Lead Channel Setting Pacing Amplitude: 2 V
Lead Channel Setting Pacing Pulse Width: 0.5 ms
Lead Channel Setting Sensing Sensitivity: 0.3 mV
MDC IDC MSMT BATTERY VOLTAGE: 2.9 V
MDC IDC MSMT LEADCHNL RV IMPEDANCE VALUE: 464 Ohm
MDC IDC MSMT LEADCHNL RV SENSING INTR AMPL: 4.6861
MDC IDC SET ZONE DETECTION INTERVAL: 400 ms
MDC IDC STAT BRADY RV PERCENT PACED: 1 %
Zone Setting Detection Interval: 300 ms
Zone Setting Detection Interval: 400 ms

## 2014-06-24 NOTE — Assessment & Plan Note (Signed)
His Medtronic single chamber ICD is working normally. He has a 6949 lead in place. He has a sprint Fidelis lead. Will recheck in several months.

## 2014-06-24 NOTE — Progress Notes (Signed)
      HPI [name] is referred today for ongoing evaluation and management of chronic systolic heart failure, a long standing cardiomyopathy, ?ischemic, s/p ICD implant. He recently fell and broke his neck and is wearing an extensive cervical collar. He denies chest pain or sob. His strength is improving as he is undergoing rehab at a SNF. Previously he had been [facility] and [facility]. We do not have his old records today.  No Known Allergies   No current outpatient prescriptions on file.   No current facility-administered medications for this visit.     Past Medical History  Diagnosis Date  . Osteoarthritis   . Chronic diarrhea   . Hypertension   . Fracture of vertebra 10/2013    Lumbar    ROS:   All systems reviewed and negative except as noted in the HPI.   Past Surgical History  Procedure Laterality Date  . Prostatectomy    . Colonoscopy       Family History  Problem Relation Age of Onset  . Healthy Brother   . Healthy Son   . Healthy Son      History   Social History  . Marital Status: Married    Spouse Name: N/A    Number of Children: N/A  . Years of Education: N/A   Occupational History  . Not on file.   Social History Main Topics  . Smoking status: Never Smoker   . Smokeless tobacco: Never Used  . Alcohol Use: Yes     Comment: Patient states that he drinks a bottle of beer every night.  . Drug Use: No  . Sexual Activity: Not on file   Other Topics Concern  . Not on file   Social History Narrative  . No narrative on file     BP 116/58  Pulse 72, R - 18  Physical Exam:  Chronically ill appearing man, NAD HEENT: Unremarkable Neck:  No JVD, no thyromegally Back:  No CVA tenderness Lungs:  Clear with no wheezes HEART:  Regular rate rhythm, no murmurs, no rubs, no clicks Abd:  soft, positive bowel sounds, no organomegally, no rebound, no guarding Ext:  2 plus pulses, no edema, no cyanosis, no clubbing Skin:  No rashes no  nodules Neuro:  CN II through XII intact, motor grossly intact   DEVICE  Normal device function.  See PaceArt for details.   Assess/Plan:

## 2014-06-24 NOTE — Patient Instructions (Signed)
Your physician recommends that you schedule a follow-up appointment on September 21, 2014 at 8:30am with Dr. Ladona Ridgelaylor.   Your physician recommends that you continue on your current medications as directed. Please refer to the Current Medication list given to you today.  Thank you for choosing [facility]!!

## 2014-06-24 NOTE — Assessment & Plan Note (Signed)
His symptoms appear to be class 2 but he is limited by his spinal fx. We will try and get more records. He is on a beta blocker and an ACE inhibitor. No change in meds for now.

## 2014-06-24 NOTE — Assessment & Plan Note (Signed)
He appears to have chronic atrial fib and remains on coumadin despite his recent fall. If he continues to fall, we will need to stop this medication. His rate appears to be well controlled.

## 2014-06-25 ENCOUNTER — Encounter: Payer: Self-pay | Admitting: Internal Medicine

## 2014-06-25 LAB — PACEMAKER DEVICE OBSERVATION

## 2014-06-29 ENCOUNTER — Non-Acute Institutional Stay (SKILLED_NURSING_FACILITY): Payer: Medicare Other | Admitting: Internal Medicine

## 2014-06-29 DIAGNOSIS — M4852XD Collapsed vertebra, not elsewhere classified, cervical region, subsequent encounter for fracture with routine healing: Secondary | ICD-10-CM

## 2014-06-29 DIAGNOSIS — S129XXD Fracture of neck, unspecified, subsequent encounter: Secondary | ICD-10-CM

## 2014-07-02 NOTE — Progress Notes (Addendum)
Patient ID: Mark Day, male   DOB: 05/19/1923, 80 y.o.   MRN: [id number]               PROGRESS NOTE  DATE:  06/29/2014    FACILITY: [facility]    LEVEL OF CARE:   SNF   Acute Visit   CHIEF COMPLAINT:  Follow up anticoagulation, atrial fibrillation.    HISTORY OF PRESENT ILLNESS:  This is a patient who came to us from [facility], whom I admitted to the building on 06/03/2014.    He suffered a fall with an epidural hematoma, a spinal fracture in the C-spine for which he has a long brace.    He also has a history of atrial fibrillation and had been on Coumadin prior to this.  His Coumadin was held for a period of time and then he was restarted after blessing from Neurosurgery.  He came here on Lovenox and Coumadin combined, although I was simply unwilling to run this risk.  He has been on both Coumadin and aspirin at 81 mg.  INR today is 1.55 on 4 mg.  This was changed to 5 mg alternating with 4 mg on 06/22/2014, although his INR does not seem to have gone up at all.  In fact, it has gone from 1.62 to 1.55.  I am planning to change him to 5 mg daily.  I would like to get him at the 2 range, not much higher than that.  He is a fall risk and has already been in the ER with epistaxis.    With regards to his cardiac status, I note he saw Dr. Ladona Ridgelaylor last week.  He has a history of cardiomyopathy, ?ischemic, with AICD implant.  His medications were not changed.  His pacemaker is functioning well.     PHYSICAL EXAMINATION:   GENERAL APPEARANCE:  The patient is not in any distress.   CHEST/RESPIRATORY:  Clear air entry bilaterally.   CARDIOVASCULAR:  CARDIAC:  Clearly atrial fibrillation.  He has a 2-3/6 pansystolic murmur.  He has no elevation in his jugular venous pressure.  He appears to be euvolemic.    ASSESSMENT/PLAN:    Cardiac status is stable.  [facility], although I have never seen this.    Cervical neck fracture.  In a long  brace.  He appears to be tolerating this satisfactorily.  I am not completely certain where he is in therapy, however.  He had bilateral lower extremity weakness when he came in, with upgoing plantar responses.  I would like to check this at some point.

## 2014-07-06 ENCOUNTER — Non-Acute Institutional Stay (SKILLED_NURSING_FACILITY): Payer: Medicare Other | Admitting: Internal Medicine

## 2014-07-06 DIAGNOSIS — I481 Persistent atrial fibrillation: Secondary | ICD-10-CM

## 2014-07-06 DIAGNOSIS — I4819 Other persistent atrial fibrillation: Secondary | ICD-10-CM

## 2014-07-06 DIAGNOSIS — K5732 Diverticulitis of large intestine without perforation or abscess without bleeding: Secondary | ICD-10-CM

## 2014-07-08 NOTE — Progress Notes (Signed)
Patient ID: Mark Day, male   DOB: 09/18/1922, 80 y.o.   MRN: [id number]               PROGRESS NOTE  DATE:  07/06/2014    FACILITY: [facility]    LEVEL OF CARE:   SNF   Acute Visit   CHIEF COMPLAINT:  Vomiting.    HISTORY OF PRESENT ILLNESS:  Mark Day is a gentleman who came to us after falling.  He suffered a C7, T2, T3 fracture.  He has a large cervical brace on.  He also had an epidural hematoma.    He has chronic atrial fibrillation, on Coumadin.    Apparently yesterday, he had non-bloody emesis x2.  He has had another one this morning.  He has not been eating and drinking.  For some reason, he is on thickened liquids.  He is not drinking these well, anyway.  He is complaining of some lower quadrant pain and I am seeing him today because of all this.    REVIEW OF SYSTEMS:   CHEST/RESPIRATORY:  No clear shortness of breath.   CARDIAC:   No clear chest pain.   GU:  No dysuria.   GI:  Abdominal pain in the lower abdomen.  Vomiting, as noted.    PHYSICAL EXAMINATION:   VITAL SIGNS:   TEMPERATURE:   He is not running a fever.   PULSE:  100 and irregular.   BLOOD PRESSURE:  166/106.    O2 SATURATIONS:  96% on room air.   GENERAL APPEARANCE:  The patient is not in any distress.  Alert and conversational.   CARDIOVASCULAR:  CARDIAC:  Heart sounds are regular.  There are no murmurs.   GASTROINTESTINAL:  ABDOMEN:   Nondistended.  Bowel sounds are positive.  He has significant left lower quadrant tenderness which I find a bit concerning.  This is superficial.  He also looks somewhat dehydrated.  There is no guarding or rebound.   GENITOURINARY:  BLADDER:   No suprapubic or costovertebral angle tenderness.    ASSESSMENT/PLAN:    Vomiting in the setting of left lower quadrant abdominal tenderness.  I would wonder about early diverticulitis.  I am going to give him 2 L of IV fluid as I think he is somewhat dehydrated.  I am also going to start him on  Augmentin.    On 5 mg of Coumadin.  His PT/INR is 1.71.  Increase him to 6 mg.  Follow him later in the week.     We will also need to have a CBC and basic metabolic panel tomorrow.    CPT CODE: [id number]

## 2014-07-09 ENCOUNTER — Encounter (HOSPITAL_COMMUNITY): Payer: Self-pay

## 2014-07-09 ENCOUNTER — Inpatient Hospital Stay (HOSPITAL_COMMUNITY): Payer: Medicare Other

## 2014-07-09 ENCOUNTER — Emergency Department (HOSPITAL_COMMUNITY): Payer: Medicare Other

## 2014-07-09 ENCOUNTER — Inpatient Hospital Stay (HOSPITAL_COMMUNITY)
Admission: EM | Admit: 2014-07-09 | Discharge: 2014-07-28 | DRG: 377 | Disposition: E | Payer: Medicare Other | Attending: Pulmonary Disease | Admitting: Pulmonary Disease

## 2014-07-09 DIAGNOSIS — R6520 Severe sepsis without septic shock: Secondary | ICD-10-CM | POA: Diagnosis not present

## 2014-07-09 DIAGNOSIS — T45515A Adverse effect of anticoagulants, initial encounter: Secondary | ICD-10-CM

## 2014-07-09 DIAGNOSIS — I472 Ventricular tachycardia: Secondary | ICD-10-CM | POA: Diagnosis not present

## 2014-07-09 DIAGNOSIS — I959 Hypotension, unspecified: Secondary | ICD-10-CM | POA: Diagnosis not present

## 2014-07-09 DIAGNOSIS — D62 Acute posthemorrhagic anemia: Secondary | ICD-10-CM | POA: Diagnosis not present

## 2014-07-09 DIAGNOSIS — I482 Chronic atrial fibrillation: Secondary | ICD-10-CM | POA: Diagnosis present

## 2014-07-09 DIAGNOSIS — J9690 Respiratory failure, unspecified, unspecified whether with hypoxia or hypercapnia: Secondary | ICD-10-CM

## 2014-07-09 DIAGNOSIS — R112 Nausea with vomiting, unspecified: Secondary | ICD-10-CM | POA: Diagnosis present

## 2014-07-09 DIAGNOSIS — K922 Gastrointestinal hemorrhage, unspecified: Secondary | ICD-10-CM

## 2014-07-09 DIAGNOSIS — K5660 Unspecified intestinal obstruction: Secondary | ICD-10-CM | POA: Diagnosis present

## 2014-07-09 DIAGNOSIS — E876 Hypokalemia: Secondary | ICD-10-CM | POA: Diagnosis not present

## 2014-07-09 DIAGNOSIS — Z9181 History of falling: Secondary | ICD-10-CM

## 2014-07-09 DIAGNOSIS — I255 Ischemic cardiomyopathy: Secondary | ICD-10-CM | POA: Diagnosis present

## 2014-07-09 DIAGNOSIS — J9601 Acute respiratory failure with hypoxia: Secondary | ICD-10-CM | POA: Diagnosis not present

## 2014-07-09 DIAGNOSIS — K5669 Other intestinal obstruction: Secondary | ICD-10-CM

## 2014-07-09 DIAGNOSIS — I10 Essential (primary) hypertension: Secondary | ICD-10-CM | POA: Diagnosis present

## 2014-07-09 DIAGNOSIS — I5023 Acute on chronic systolic (congestive) heart failure: Secondary | ICD-10-CM | POA: Diagnosis not present

## 2014-07-09 DIAGNOSIS — I272 Other secondary pulmonary hypertension: Secondary | ICD-10-CM | POA: Diagnosis present

## 2014-07-09 DIAGNOSIS — D6832 Hemorrhagic disorder due to extrinsic circulating anticoagulants: Secondary | ICD-10-CM | POA: Diagnosis present

## 2014-07-09 DIAGNOSIS — R791 Abnormal coagulation profile: Secondary | ICD-10-CM | POA: Diagnosis present

## 2014-07-09 DIAGNOSIS — J690 Pneumonitis due to inhalation of food and vomit: Secondary | ICD-10-CM | POA: Diagnosis not present

## 2014-07-09 DIAGNOSIS — K920 Hematemesis: Secondary | ICD-10-CM | POA: Diagnosis present

## 2014-07-09 DIAGNOSIS — A419 Sepsis, unspecified organism: Secondary | ICD-10-CM | POA: Diagnosis not present

## 2014-07-09 DIAGNOSIS — Z66 Do not resuscitate: Secondary | ICD-10-CM | POA: Diagnosis not present

## 2014-07-09 DIAGNOSIS — R140 Abdominal distension (gaseous): Secondary | ICD-10-CM

## 2014-07-09 DIAGNOSIS — Z7901 Long term (current) use of anticoagulants: Secondary | ICD-10-CM | POA: Diagnosis not present

## 2014-07-09 DIAGNOSIS — A4902 Methicillin resistant Staphylococcus aureus infection, unspecified site: Secondary | ICD-10-CM | POA: Diagnosis not present

## 2014-07-09 DIAGNOSIS — I4891 Unspecified atrial fibrillation: Secondary | ICD-10-CM | POA: Diagnosis present

## 2014-07-09 DIAGNOSIS — R569 Unspecified convulsions: Secondary | ICD-10-CM | POA: Diagnosis not present

## 2014-07-09 DIAGNOSIS — R339 Retention of urine, unspecified: Secondary | ICD-10-CM | POA: Diagnosis present

## 2014-07-09 DIAGNOSIS — Z95810 Presence of automatic (implantable) cardiac defibrillator: Secondary | ICD-10-CM

## 2014-07-09 DIAGNOSIS — R54 Age-related physical debility: Secondary | ICD-10-CM | POA: Diagnosis present

## 2014-07-09 DIAGNOSIS — K2981 Duodenitis with bleeding: Secondary | ICD-10-CM | POA: Diagnosis present

## 2014-07-09 DIAGNOSIS — R110 Nausea: Secondary | ICD-10-CM

## 2014-07-09 DIAGNOSIS — R739 Hyperglycemia, unspecified: Secondary | ICD-10-CM | POA: Diagnosis present

## 2014-07-09 DIAGNOSIS — J189 Pneumonia, unspecified organism: Secondary | ICD-10-CM

## 2014-07-09 DIAGNOSIS — I5033 Acute on chronic diastolic (congestive) heart failure: Secondary | ICD-10-CM | POA: Insufficient documentation

## 2014-07-09 DIAGNOSIS — D699 Hemorrhagic condition, unspecified: Secondary | ICD-10-CM

## 2014-07-09 DIAGNOSIS — E46 Unspecified protein-calorie malnutrition: Secondary | ICD-10-CM | POA: Diagnosis present

## 2014-07-09 DIAGNOSIS — K117 Disturbances of salivary secretion: Secondary | ICD-10-CM | POA: Insufficient documentation

## 2014-07-09 DIAGNOSIS — K449 Diaphragmatic hernia without obstruction or gangrene: Secondary | ICD-10-CM | POA: Diagnosis present

## 2014-07-09 DIAGNOSIS — K567 Ileus, unspecified: Secondary | ICD-10-CM

## 2014-07-09 DIAGNOSIS — E871 Hypo-osmolality and hyponatremia: Secondary | ICD-10-CM | POA: Diagnosis not present

## 2014-07-09 DIAGNOSIS — Z01812 Encounter for preprocedural laboratory examination: Secondary | ICD-10-CM

## 2014-07-09 DIAGNOSIS — Z515 Encounter for palliative care: Secondary | ICD-10-CM

## 2014-07-09 DIAGNOSIS — R1110 Vomiting, unspecified: Secondary | ICD-10-CM

## 2014-07-09 DIAGNOSIS — Z978 Presence of other specified devices: Secondary | ICD-10-CM | POA: Insufficient documentation

## 2014-07-09 DIAGNOSIS — R0602 Shortness of breath: Secondary | ICD-10-CM

## 2014-07-09 DIAGNOSIS — Z79899 Other long term (current) drug therapy: Secondary | ICD-10-CM | POA: Diagnosis not present

## 2014-07-09 DIAGNOSIS — E869 Volume depletion, unspecified: Secondary | ICD-10-CM | POA: Diagnosis present

## 2014-07-09 DIAGNOSIS — S22009A Unspecified fracture of unspecified thoracic vertebra, initial encounter for closed fracture: Secondary | ICD-10-CM | POA: Diagnosis present

## 2014-07-09 DIAGNOSIS — R4182 Altered mental status, unspecified: Secondary | ICD-10-CM

## 2014-07-09 DIAGNOSIS — I4729 Other ventricular tachycardia: Secondary | ICD-10-CM | POA: Insufficient documentation

## 2014-07-09 DIAGNOSIS — Z7982 Long term (current) use of aspirin: Secondary | ICD-10-CM

## 2014-07-09 DIAGNOSIS — K8020 Calculus of gallbladder without cholecystitis without obstruction: Secondary | ICD-10-CM

## 2014-07-09 DIAGNOSIS — K56609 Unspecified intestinal obstruction, unspecified as to partial versus complete obstruction: Secondary | ICD-10-CM | POA: Diagnosis present

## 2014-07-09 DIAGNOSIS — R4189 Other symptoms and signs involving cognitive functions and awareness: Secondary | ICD-10-CM

## 2014-07-09 HISTORY — DX: Presence of automatic (implantable) cardiac defibrillator: Z95.810

## 2014-07-09 HISTORY — DX: Fracture of neck, unspecified, initial encounter: S12.9XXA

## 2014-07-09 HISTORY — DX: Tremor, unspecified: R25.1

## 2014-07-09 HISTORY — DX: Duodenitis with bleeding: K29.81

## 2014-07-09 HISTORY — DX: Calculus of gallbladder without cholecystitis without obstruction: K80.20

## 2014-07-09 HISTORY — DX: Epidural hemorrhage with loss of consciousness of unspecified duration, initial encounter: S06.4X9A

## 2014-07-09 HISTORY — DX: Chronic systolic (congestive) heart failure: I50.22

## 2014-07-09 HISTORY — DX: Unspecified fracture of unspecified thoracic vertebra, initial encounter for closed fracture: S22.009A

## 2014-07-09 HISTORY — DX: Pulmonary hypertension, unspecified: I27.20

## 2014-07-09 HISTORY — DX: Long term (current) use of anticoagulants: Z79.01

## 2014-07-09 HISTORY — DX: Cardiomegaly: I51.7

## 2014-07-09 HISTORY — DX: Unspecified atrial fibrillation: I48.91

## 2014-07-09 LAB — COMPREHENSIVE METABOLIC PANEL
ALT: 14 U/L (ref 0–53)
AST: 23 U/L (ref 0–37)
Albumin: 3.8 g/dL (ref 3.5–5.2)
Alkaline Phosphatase: 139 U/L — ABNORMAL HIGH (ref 39–117)
Anion gap: 16 — ABNORMAL HIGH (ref 5–15)
BILIRUBIN TOTAL: 1.7 mg/dL — AB (ref 0.3–1.2)
BUN: 29 mg/dL — ABNORMAL HIGH (ref 6–23)
CALCIUM: 10.5 mg/dL (ref 8.4–10.5)
CO2: 29 meq/L (ref 19–32)
CREATININE: 1.06 mg/dL (ref 0.50–1.35)
Chloride: 95 mEq/L — ABNORMAL LOW (ref 96–112)
GFR calc Af Amer: 70 mL/min — ABNORMAL LOW (ref >90)
GFR, EST NON AFRICAN AMERICAN: 60 mL/min — AB (ref >90)
GLUCOSE: 119 mg/dL — AB (ref 70–99)
Potassium: 3.6 mEq/L — ABNORMAL LOW (ref 3.7–5.3)
SODIUM: 140 meq/L (ref 137–147)
Total Protein: 7.8 g/dL (ref 6.0–8.3)

## 2014-07-09 LAB — LIPASE, BLOOD: Lipase: 52 U/L (ref 11–59)

## 2014-07-09 LAB — CBC
HCT: 35.4 % — ABNORMAL LOW (ref 39.0–52.0)
HEMATOCRIT: 36.1 % — AB (ref 39.0–52.0)
Hemoglobin: 12.3 g/dL — ABNORMAL LOW (ref 13.0–17.0)
Hemoglobin: 11.8 g/dL — ABNORMAL LOW (ref 13.0–17.0)
MCH: 32.6 pg (ref 26.0–34.0)
MCH: 32.1 pg (ref 26.0–34.0)
MCHC: 34.1 g/dL (ref 30.0–36.0)
MCHC: 33.3 g/dL (ref 30.0–36.0)
MCV: 95.8 fL (ref 78.0–100.0)
MCV: 96.2 fL (ref 78.0–100.0)
Platelets: 192 10*3/uL (ref 150–400)
Platelets: 176 10*3/uL (ref 150–400)
RBC: 3.77 MIL/uL — ABNORMAL LOW (ref 4.22–5.81)
RBC: 3.68 MIL/uL — ABNORMAL LOW (ref 4.22–5.81)
RDW: 16.7 % — AB (ref 11.5–15.5)
RDW: 16.7 % — ABNORMAL HIGH (ref 11.5–15.5)
WBC: 8.3 10*3/uL (ref 4.0–10.5)
WBC: 7.1 10*3/uL (ref 4.0–10.5)

## 2014-07-09 LAB — TYPE AND SCREEN
ABO/RH(D): A NEG
Antibody Screen: NEGATIVE

## 2014-07-09 LAB — AMMONIA: Ammonia: 23 umol/L (ref 11–60)

## 2014-07-09 LAB — I-STAT CHEM 8, ED
BUN: 34 mg/dL — ABNORMAL HIGH (ref 6–23)
CHLORIDE: 98 meq/L (ref 96–112)
CREATININE: 1 mg/dL (ref 0.50–1.35)
Calcium, Ion: 1.23 mmol/L (ref 1.13–1.30)
Glucose, Bld: 121 mg/dL — ABNORMAL HIGH (ref 70–99)
HCT: 44 % (ref 39.0–52.0)
Hemoglobin: 15 g/dL (ref 13.0–17.0)
POTASSIUM: 3.5 meq/L — AB (ref 3.7–5.3)
Sodium: 138 mEq/L (ref 137–147)
TCO2: 31 mmol/L (ref 0–100)

## 2014-07-09 LAB — CBC WITH DIFFERENTIAL/PLATELET
Basophils Absolute: 0 10*3/uL (ref 0.0–0.1)
Basophils Relative: 0 % (ref 0–1)
Eosinophils Absolute: 0.1 10*3/uL (ref 0.0–0.7)
Eosinophils Relative: 1 % (ref 0–5)
HEMATOCRIT: 41 % (ref 39.0–52.0)
HEMOGLOBIN: 13.8 g/dL (ref 13.0–17.0)
LYMPHS PCT: 16 % (ref 12–46)
Lymphs Abs: 1.5 10*3/uL (ref 0.7–4.0)
MCH: 32.2 pg (ref 26.0–34.0)
MCHC: 33.7 g/dL (ref 30.0–36.0)
MCV: 95.6 fL (ref 78.0–100.0)
MONO ABS: 0.9 10*3/uL (ref 0.1–1.0)
MONOS PCT: 9 % (ref 3–12)
NEUTROS ABS: 6.8 10*3/uL (ref 1.7–7.7)
Neutrophils Relative %: 74 % (ref 43–77)
Platelets: 228 10*3/uL (ref 150–400)
RBC: 4.29 MIL/uL (ref 4.22–5.81)
RDW: 16.7 % — ABNORMAL HIGH (ref 11.5–15.5)
WBC: 9.2 10*3/uL (ref 4.0–10.5)

## 2014-07-09 LAB — APTT: aPTT: 40 seconds — ABNORMAL HIGH (ref 24–37)

## 2014-07-09 LAB — BLOOD GAS, ARTERIAL
ACID-BASE EXCESS: 1.8 mmol/L (ref 0.0–2.0)
Bicarbonate: 26.7 mEq/L — ABNORMAL HIGH (ref 20.0–24.0)
DRAWN BY: 23534
O2 CONTENT: 3 L/min
O2 SAT: 93.7 %
Patient temperature: 37
TCO2: 24.3 mmol/L (ref 0–100)
pCO2 arterial: 48.8 mmHg — ABNORMAL HIGH (ref 35.0–45.0)
pH, Arterial: 7.357 (ref 7.350–7.450)
pO2, Arterial: 74.9 mmHg — ABNORMAL LOW (ref 80.0–100.0)

## 2014-07-09 LAB — CBG MONITORING, ED
Glucose-Capillary: 105 mg/dL — ABNORMAL HIGH (ref 70–99)
Glucose-Capillary: 101 mg/dL — ABNORMAL HIGH (ref 70–99)

## 2014-07-09 LAB — BASIC METABOLIC PANEL
ANION GAP: 10 (ref 5–15)
BUN: 27 mg/dL — ABNORMAL HIGH (ref 6–23)
CHLORIDE: 102 meq/L (ref 96–112)
CO2: 27 mEq/L (ref 19–32)
Calcium: 9.2 mg/dL (ref 8.4–10.5)
Creatinine, Ser: 1.05 mg/dL (ref 0.50–1.35)
GFR calc non Af Amer: 61 mL/min — ABNORMAL LOW (ref >90)
GFR, EST AFRICAN AMERICAN: 70 mL/min — AB (ref >90)
Glucose, Bld: 115 mg/dL — ABNORMAL HIGH (ref 70–99)
POTASSIUM: 3.7 meq/L (ref 3.7–5.3)
Sodium: 139 mEq/L (ref 137–147)

## 2014-07-09 LAB — RAPID URINE DRUG SCREEN, HOSP PERFORMED
Amphetamines: NOT DETECTED
Barbiturates: NOT DETECTED
Benzodiazepines: NOT DETECTED
Cocaine: NOT DETECTED
Opiates: NOT DETECTED
TETRAHYDROCANNABINOL: NOT DETECTED

## 2014-07-09 LAB — GLUCOSE, CAPILLARY: GLUCOSE-CAPILLARY: 114 mg/dL — AB (ref 70–99)

## 2014-07-09 LAB — MRSA PCR SCREENING: MRSA BY PCR: NEGATIVE

## 2014-07-09 LAB — POC OCCULT BLOOD, ED: Fecal Occult Bld: NEGATIVE

## 2014-07-09 LAB — MAGNESIUM: Magnesium: 1.8 mg/dL (ref 1.5–2.5)

## 2014-07-09 LAB — PRO B NATRIURETIC PEPTIDE: Pro B Natriuretic peptide (BNP): 3979 pg/mL — ABNORMAL HIGH (ref 0–450)

## 2014-07-09 LAB — TROPONIN I: Troponin I: <0.3 ng/mL (ref <0.30)

## 2014-07-09 LAB — PROTIME-INR
INR: 3.03 — AB (ref 0.00–1.49)
PROTHROMBIN TIME: 31.6 s — AB (ref 11.6–15.2)

## 2014-07-09 LAB — I-STAT CG4 LACTIC ACID, ED: Lactic Acid, Venous: 2.45 mmol/L — ABNORMAL HIGH (ref 0.5–2.2)

## 2014-07-09 LAB — LACTIC ACID, PLASMA: LACTIC ACID, VENOUS: 0.9 mmol/L (ref 0.5–2.2)

## 2014-07-09 MED ORDER — PANTOPRAZOLE SODIUM 40 MG IV SOLR
40.0000 mg | Freq: Two times a day (BID) | INTRAVENOUS | Status: DC
  Administered 2014-07-09 – 2014-07-22 (×26): 40 mg via INTRAVENOUS
  Filled 2014-07-09 (×28): qty 40

## 2014-07-09 MED ORDER — IOHEXOL 300 MG/ML  SOLN
25.0000 mL | Freq: Once | INTRAMUSCULAR | Status: AC | PRN
  Administered 2014-07-09: 25 mL via ORAL

## 2014-07-09 MED ORDER — METOPROLOL TARTRATE 1 MG/ML IV SOLN
5.0000 mg | INTRAVENOUS | Status: DC
  Administered 2014-07-09 – 2014-07-12 (×18): 5 mg via INTRAVENOUS
  Filled 2014-07-09 (×31): qty 5

## 2014-07-09 MED ORDER — POTASSIUM CHLORIDE IN NACL 20-0.9 MEQ/L-% IV SOLN: INTRAVENOUS | Status: DC

## 2014-07-09 MED ORDER — SODIUM CHLORIDE 0.9 % IV SOLN: Freq: Once | INTRAVENOUS | Status: DC

## 2014-07-09 MED ORDER — KCL IN DEXTROSE-NACL 20-5-0.9 MEQ/L-%-% IV SOLN
INTRAVENOUS | Status: DC
  Administered 2014-07-09 – 2014-07-11 (×4): via INTRAVENOUS
  Filled 2014-07-09 (×2): qty 1000

## 2014-07-09 MED ORDER — LEVALBUTEROL HCL 0.63 MG/3ML IN NEBU
0.6300 mg | INHALATION_SOLUTION | Freq: Four times a day (QID) | RESPIRATORY_TRACT | Status: DC | PRN
  Administered 2014-07-20: 0.63 mg via RESPIRATORY_TRACT
  Filled 2014-07-09: qty 3

## 2014-07-09 MED ORDER — PANTOPRAZOLE SODIUM 40 MG IV SOLR
40.0000 mg | Freq: Once | INTRAVENOUS | Status: AC
  Administered 2014-07-09: 40 mg via INTRAVENOUS
  Filled 2014-07-09: qty 40

## 2014-07-09 MED ORDER — VITAMIN K1 10 MG/ML IJ SOLN
2.0000 mg | Freq: Once | INTRAMUSCULAR | Status: AC
  Administered 2014-07-09: 2 mg via SUBCUTANEOUS
  Filled 2014-07-09: qty 1

## 2014-07-09 MED ORDER — TECHNETIUM TO 99M ALBUMIN AGGREGATED
6.0000 | Freq: Once | INTRAVENOUS | Status: AC | PRN
  Administered 2014-07-09: 6 via INTRAVENOUS

## 2014-07-09 MED ORDER — DEXTROSE 50 % IV SOLN
1.0000 | Freq: Once | INTRAVENOUS | Status: AC
  Administered 2014-07-09: 50 mL via INTRAVENOUS

## 2014-07-09 MED ORDER — SODIUM CHLORIDE 0.9 % IV SOLN
1000.0000 mL | Freq: Once | INTRAVENOUS | Status: AC
  Administered 2014-07-09: 1000 mL via INTRAVENOUS

## 2014-07-09 MED ORDER — POTASSIUM CHLORIDE 10 MEQ/100ML IV SOLN
10.0000 meq | INTRAVENOUS | Status: AC
  Administered 2014-07-09 (×2): 10 meq via INTRAVENOUS
  Filled 2014-07-09 (×2): qty 100

## 2014-07-09 MED ORDER — ONDANSETRON HCL 4 MG/2ML IJ SOLN
4.0000 mg | Freq: Four times a day (QID) | INTRAMUSCULAR | Status: DC
Start: 2014-07-09 — End: 2014-07-24
  Administered 2014-07-09 – 2014-07-24 (×44): 4 mg via INTRAVENOUS
  Filled 2014-07-09 (×39): qty 2

## 2014-07-09 MED ORDER — ONDANSETRON HCL 4 MG/2ML IJ SOLN
4.0000 mg | Freq: Once | INTRAMUSCULAR | Status: AC
  Administered 2014-07-09: 4 mg via INTRAVENOUS
  Filled 2014-07-09: qty 2

## 2014-07-09 MED ORDER — SODIUM CHLORIDE 0.9 % IV SOLN
INTRAVENOUS | Status: DC
  Administered 2014-07-09: 14:00:00 via INTRAVENOUS

## 2014-07-09 MED ORDER — MORPHINE SULFATE 2 MG/ML IJ SOLN
1.0000 mg | INTRAMUSCULAR | Status: DC | PRN
  Administered 2014-07-09 – 2014-07-16 (×4): 1 mg via INTRAVENOUS
  Filled 2014-07-09 (×4): qty 1

## 2014-07-09 MED ORDER — DEXTROSE 50 % IV SOLN
INTRAVENOUS | Status: AC
  Filled 2014-07-09: qty 50

## 2014-07-09 MED ORDER — IOHEXOL 300 MG/ML  SOLN
100.0000 mL | Freq: Once | INTRAMUSCULAR | Status: AC | PRN
  Administered 2014-07-09: 100 mL via INTRAVENOUS

## 2014-07-09 MED ORDER — CETYLPYRIDINIUM CHLORIDE 0.05 % MT LIQD
7.0000 mL | Freq: Two times a day (BID) | OROMUCOSAL | Status: DC
  Administered 2014-07-09 – 2014-07-20 (×23): 7 mL via OROMUCOSAL

## 2014-07-09 NOTE — ED Notes (Signed)
Pt resident of [facility].  Reports that pt had been c/o n/v since yesterday and this am vomited large amount of coffee ground emesis.  Pt says feels the best he's felt in 2 days after vomiting this morning.  Denies pain but staff reports pt was tender on left side of abd when examined by physician yesterday.  Reports thought has diverticulitis and was started on augmentin bid yesterday.  Pt presently denies any pain or nausea.  Pt says had small bm yesterday but last normal bm was several days ago.

## 2014-07-09 NOTE — ED Provider Notes (Signed)
CSN: [id number]     Arrival date & time 06/22/14  0910 History  This chart was scribed for Mark OctaveStephen Whitleigh Garramone, MD by Tonye RoyaltyJoshua Chen, ED Scribe. This patient was seen in room IC04/IC04-01 and the patient's care was started at 9:15 AM.    Chief Complaint  Patient presents with  . Hematemesis   The history is provided by the patient. No language interpreter was used.    HPI Comments: Mark Day is a 80 y.o. male who takes Coumadin presents to the Emergency Department complaining of red emesis this morning, with nausea and vomiting that began yesterday and decreased eating and drinking for the past 2-3 days. He states he vomited once yesterday and vomited a large amount after eating this morning; afterwards he felt better. He notes his emesis today was red, but believes it was due to drinking tomato juice last night. He states he had a small bowel movement yesterday, and the last normal one was several days ago. He denies abdominal pain, chest pain, back pain, pain elsewhere, headache, dizziness, lightheadedness, SOB, diarrhea, blood in stool, or dark stool. He was tender in his left abdomen yesterday and was started on Augment BID for diverticulitis. He denies history of ulcers. He reports colonoscopy 2-3 years ago but has not had an upper endoscopy. He reports prior prostate operation and pacemaker implant. He does not wear oxygen at home. He states he drinks 1 beer daily. He has been at the [facility] since he fell in March with resulting vertebral fracture.  Past Medical History  Diagnosis Date  . Osteoarthritis   . Chronic diarrhea   . Hypertension   . Fracture of vertebra 10/2013    Lumbar  . Epidural hematoma 04/2014    [facility]  . Chronic systolic heart failure   . AICD (automatic cardioverter/defibrillator) present   . Atrial fibrillation     On coumadin  . Multiple fractures of cervical spine 04/2014    [facility]  . Tremor of both hands     Not Parkinson  .  Cholelithiasis 12-15-2013   Past Surgical History  Procedure Laterality Date  . Prostatectomy    . Colonoscopy    . Neck surgery     Family History  Problem Relation Age of Onset  . Healthy Brother   . Healthy Son   . Healthy Son    History  Substance Use Topics  . Smoking status: Never Smoker   . Smokeless tobacco: Never Used  . Alcohol Use: Yes     Comment: Patient states that he drinks a bottle of beer every night.    Review of Systems A complete 10 system review of systems was obtained and all systems are negative except as noted in the HPI and PMH.    Allergies  Review of patient's allergies indicates no known allergies.  Home Medications   Prior to Admission medications   Medication Sig Start Date End Date Taking? Authorizing Provider  ALPRAZolam (XANAX) 0.25 MG tablet Take 0.125 mg by mouth every 6 (six) hours as needed for anxiety.    Yes Historical Provider, MD  amoxicillin-clavulanate (AUGMENTIN) 875-125 MG per tablet Take 1 tablet by mouth 2 (two) times daily. Starting 07/06/2014 and ending 07/10/2014.   Yes Historical Provider, MD  aspirin 81 MG tablet Take 81 mg by mouth daily.   Yes Historical Provider, MD  atorvastatin (LIPITOR) 10 MG tablet Take 10 mg by mouth daily.   Yes Historical Provider, MD  escitalopram (  LEXAPRO) 10 MG tablet Take 10 mg by mouth daily.   Yes Historical Provider, MD  lisinopril (PRINIVIL,ZESTRIL) 10 MG tablet Take 10 mg by mouth daily.   Yes Historical Provider, MD  metoprolol (LOPRESSOR) 50 MG tablet Take 50 mg by mouth 2 (two) times daily.   Yes Historical Provider, MD  warfarin (COUMADIN) 6 MG tablet Take 6 mg by mouth daily.   Yes Historical Provider, MD  docusate sodium (COLACE) 100 MG capsule Take 200 mg by mouth daily. 07/29/13   Malissa Hippo, MD  warfarin (COUMADIN) 4 MG tablet Take 4 mg by mouth every other day. Alternate between 4mg  and 5mg     Historical Provider, MD  warfarin (COUMADIN) 5 MG tablet Take 5 mg by mouth every  other day. Alternate between 4mg  and 5mg     Historical Provider, MD   BP 95/63 mmHg  Pulse 87  Temp(Src) 98.9 F (37.2 C) (Rectal)  Resp 23  Ht 6\' 1"  (1.854 m)  Wt 153 lb 10.6 oz (69.7 kg)  BMI 20.28 kg/m2  SpO2 95% Physical Exam  Constitutional: He is oriented to person, place, and time. No distress.  HENT:  Head: Normocephalic and atraumatic.  Mouth/Throat: Oropharynx is clear and moist. No oropharyngeal exudate.  Eyes: Conjunctivae and EOM are normal. Pupils are equal, round, and reactive to light.  Neck: Normal range of motion. Neck supple.  No meningismus. Cervical collar in place.  Cardiovascular: Normal heart sounds and intact distal pulses.   No murmur heard. Tachycardic to 110, irregular rhythm  Pulmonary/Chest: Effort normal and breath sounds normal. No respiratory distress.  Abdominal: Soft. There is tenderness (lower quadrants). There is no rebound and no guarding.  Genitourinary:  Rectal exam showed no hemorrhoids no gross blood. Chaperone present.  Musculoskeletal: Normal range of motion. He exhibits no edema or tenderness.  Neurological: He is alert and oriented to person, place, and time. No cranial nerve deficit. He exhibits normal muscle tone. Coordination normal.  No ataxia on finger to nose bilaterally. No pronator drift. 5/5 strength throughout. CN 2-12 intact.Equal grip strength. Sensation intact. Gait is not tested.  Skin: Skin is warm.  Psychiatric: He has a normal mood and affect. His behavior is normal.  Nursing note and vitals reviewed.   ED Course  Procedures (including critical care time)  DIAGNOSTIC STUDIES: Oxygen Saturation is 99% on room air, normal by my interpretation.    COORDINATION OF CARE: 9:28 AM Discussed treatment plan with patient at beside, the patient agrees with the plan and has no further questions at this time.  11:55 AM Patient difficulty to rouse, family says this is not typical. Discussed with him and family that workup  reveals bowel obstruction and blood clots in his lungs. Imaging of head had benign results. Re-assessed patient. Will check blood gas. Wife states he wants to be resuscitated if necessary.   Labs Review Labs Reviewed  CBC WITH DIFFERENTIAL - Abnormal; Notable for the following:    RDW 16.7 (*)    All other components within normal limits  PROTIME-INR - Abnormal; Notable for the following:    Prothrombin Time 31.6 (*)    INR 3.03 (*)    All other components within normal limits  APTT - Abnormal; Notable for the following:    aPTT 40 (*)    All other components within normal limits  PRO B NATRIURETIC PEPTIDE - Abnormal; Notable for the following:    Pro B Natriuretic peptide (BNP) 3979.0 (*)    All other  components within normal limits  COMPREHENSIVE METABOLIC PANEL - Abnormal; Notable for the following:    Potassium 3.6 (*)    Chloride 95 (*)    Glucose, Bld 119 (*)    BUN 29 (*)    Alkaline Phosphatase 139 (*)    Total Bilirubin 1.7 (*)    GFR calc non Af Amer 60 (*)    GFR calc Af Amer 70 (*)    Anion gap 16 (*)    All other components within normal limits  BLOOD GAS, ARTERIAL - Abnormal; Notable for the following:    pCO2 arterial 48.8 (*)    pO2, Arterial 74.9 (*)    Bicarbonate 26.7 (*)    All other components within normal limits  CBC - Abnormal; Notable for the following:    RBC 3.77 (*)    Hemoglobin 12.3 (*)    HCT 36.1 (*)    RDW 16.7 (*)    All other components within normal limits  I-STAT CHEM 8, ED - Abnormal; Notable for the following:    Potassium 3.5 (*)    BUN 34 (*)    Glucose, Bld 121 (*)    All other components within normal limits  I-STAT CG4 LACTIC ACID, ED - Abnormal; Notable for the following:    Lactic Acid, Venous 2.45 (*)    All other components within normal limits  CBG MONITORING, ED - Abnormal; Notable for the following:    Glucose-Capillary 105 (*)    All other components within normal limits  CBG MONITORING, ED - Abnormal; Notable for  the following:    Glucose-Capillary 101 (*)    All other components within normal limits  MRSA PCR SCREENING  LIPASE, BLOOD  TROPONIN I  AMMONIA  MAGNESIUM  URINE RAPID DRUG SCREEN (HOSP PERFORMED)  CBC  CBC  COMPREHENSIVE METABOLIC PANEL  PROTIME-INR  APTT  POC OCCULT BLOOD, ED  TYPE AND SCREEN  PREPARE FRESH FROZEN PLASMA    Imaging Review Ct Head Wo Contrast  June 07, 2014   CLINICAL DATA:  Nausea and vomiting since yesterday, with large volume hematemesis this morning, initial evaluation  EXAM: CT HEAD WITHOUT CONTRAST  TECHNIQUE: Contiguous axial images were obtained from the base of the skull through the vertex without intravenous contrast.  COMPARISON:  04/21/2014  FINDINGS: Severe atrophy. Moderate low attenuation in the deep white matter. No evidence of acute infarct. No hemorrhage or extra-axial fluid. No evidence of mass or hydrocephalus. The calvarium is intact. There is an air-fluid level in the left sphenoid sinus. This was not present on the prior study.  IMPRESSION: Significant inflammatory change left sphenoid sinus suggesting acute sinusitis. No acute findings otherwise with significant chronic age-related involutional change.   Electronically Signed   By: Esperanza Heiraymond  Rubner M.D.   On: June 07, 2014 11:33   Nm Pulmonary Perfusion  June 07, 2014   CLINICAL DATA:  Shortness of breath at nursing home today, history hypertension, atrial fibrillation, chronic systolic heart failure  EXAM: NUCLEAR MEDICINE PERFUSION SCAN  TECHNIQUE: Perfusion images were obtained in multiple projections after intravenous injection of radiopharmaceutical. Patient was unable to cooperate and perform of an adequate ventilation exam.  RADIOPHARMACEUTICALS:  6 mCi Tc886m MAA  COMPARISON:  None; correlation chest radiograph June 07, 2014 and CT abdomen/pelvis June 07, 2014  FINDINGS: Enlargement of cardiac silhouette.  Blunting of perfusion at the posterior RIGHT lower lobe secondary to pleural effusion.  No segmental  or subsegmental perfusion defects identified.  IMPRESSION: Enlargement of cardiac silhouette and RIGHT pleural effusion.  No other segmental  or subsegmental perfusion defects.  Findings represent a very low probability for pulmonary embolism.   Electronically Signed   By: Ulyses Southward M.D.   On:  16:59   Ct Abdomen Pelvis W Contrast  07/27/2014   CLINICAL DATA:  Hematemesis  EXAM: CT ABDOMEN AND PELVIS WITH CONTRAST  TECHNIQUE: Multidetector CT imaging of the abdomen and pelvis was performed using the standard protocol following bolus administration of intravenous contrast.  CONTRAST:  25mL OMNIPAQUE IOHEXOL 300 MG/ML SOLN, OMNIPAQUE IOHEXOL 300 MG/ML SOLN  COMPARISON:  07/24/2012  FINDINGS: Lung bases demonstrate bilateral basilar atelectasis. Additionally there are changes highly suggestive of bilateral pulmonary emboli although timing was not performed for pulmonary embolism evaluation. Heavy coronary calcifications are seen.  The gallbladder is well distended and demonstrates multiple small gallstones. No pericholecystic fluid is seen. The liver, spleen, adrenal glands and pancreas are within normal limits. The kidneys are well visualized bilaterally and demonstrate renal cystic change without obstructive change.  Multiple dilated loops of small bowel are identified with a transition point low in the left hemipelvis. No definitive mass lesion is noted in this region. The more distal small bowel in the ileum is within normal limits. The appendix is not well visualized although no inflammatory changes to suggest appendicitis are noted. Diffuse diverticular change is noted within the colon. No evidence of diverticulitis is seen. The bladder is partially distended. Prostatic calcifications are noted. No pelvic lymphadenopathy is seen. Diffuse aortoiliac calcifications are noted without aneurysmal dilatation. Chronic compression deformities at T12 and L1 are noted with changes of prior vertebral  augmentation. Schmorl's node is noted in the superior endplate of L3.  IMPRESSION: Changes consistent with small bowel obstruction in the distal jejunum/proximal ileum. No definitive mass lesion is noted. These changes may be related to prior surgery with adhesions.  Changes highly suggestive of bilateral pulmonary emboli. CTA of the chest is recommended for further evaluation.  Multiple gallstones without complicating factors.  Chronic renal cystic change.  Diverticulosis without diverticulitis.   Electronically Signed   By: Alcide Clever M.D.   On: 07/10/2014 11:42   Dg Abd Acute W/chest  07/17/2014   CLINICAL DATA:  Hematemesis  EXAM: ACUTE ABDOMEN SERIES (ABDOMEN 2 VIEW & CHEST 1 VIEW)  COMPARISON:  03/13/2014  FINDINGS: There is chronic moderate cardiomegaly. Aortic and hilar contours are unchanged given lower lung volumes. There is mild atelectasis at the bases. No evidence for edema or pneumonia.  Moderate distention of the stomach by fluid and gas. No evidence of small bowel or colonic obstruction. No visible pneumoperitoneum.  IMPRESSION: 1. Moderate distension of the stomach by gas and fluid. 2. No pneumoperitoneum. 3. Shallow inspiration with bibasilar atelectasis.   Electronically Signed   By: Tiburcio Pea M.D.   On: 07/15/2014 10:36     EKG Interpretation   Date/Time:  Thursday July 09 2014 09:26:37 EST Ventricular Rate:  109 PR Interval:    QRS Duration: 115 QT Interval:  384 QTC Calculation: 517 R Axis:   83 Text Interpretation:  Atrial fibrillation Ventricular premature complex  Aberrant conduction of SV complex(es) Nonspecific intraventricular  conduction delay Low voltage, precordial leads Consider anterior infarct  Borderline repolarization abnormality lateral ST depression No previous  ECGs available Confirmed by Britnee Mcdevitt  MD, Alexi Geibel [phone]) on 07/10/2014  9:41:50 AM      MDM   Final diagnoses:  Vomiting  Small bowel obstruction  Altered mental status,  unspecified altered mental status type   Patient from nursing home  with report of coffee-ground emesis. Patient states it was bright red but he thinks it was from tomato juice he drank yesterday. He endorses some tenderness in his left side of his abdomen. Started on Augmentin yesterday for possible diverticulitis. Denies any blood in the stool. No chest pain no shortness of breath no dizziness.  Mild tachycardia, A. Fib. Patient on Coumadin.  Hemoglobin 13.8.  No obstruction on plain film.  Patient with findings of small bowel section on CT scan. Also  With findings highly suggestive of pulmonary emboli based on abdominal CT. INR is still pending.  Patient has become more somnolent throughout ED stay. Blood sugar rechecked and is 105. Vitals remained stable. He arouses to verbal command and follows some commands of falls right back asleep. CT head was negative. No CO2 retention on ABG.  Discussed with Dr. Lovell Sheehan who will evaluate patient recommends medical admission giving ongoing issues. Patient unable to tolerate NG placement. Family states they would be agreeable to life support temporarily.  INR 3. PE seems unlikely given patient's therapeutic INR. Hesitate to give IV contrast again. Will check VQ Patient arouses to voice and follows commands. He is protecting his airway.  D/w Dr. Sherrie Mustache who thought patient might be better served at [facility] but family would prefer he stay at [facility].  Dr. Lovell Sheehan to consult and Dr. Karilyn Cota to EGD tomorrow. Dr. Sherrie Mustache will admit to ICU at [facility].  CRITICAL CARE Performed by: Mark Octave Total critical care time: 60 Critical care time was exclusive of separately billable procedures and treating other patients. Critical care was necessary to treat or prevent imminent or life-threatening deterioration. Critical care was time spent personally by me on the following activities: development of treatment plan with patient and/or surrogate as well as nursing,  discussions with consultants, evaluation of patient's response to treatment, examination of patient, obtaining history from patient or surrogate, ordering and performing treatments and interventions, ordering and review of laboratory studies, ordering and review of radiographic studies, pulse oximetry and re-evaluation of patient's condition.   I personally performed the services described in this documentation, which was scribed in my presence. The recorded information has been reviewed and is accurate.    Mark Octave, MD 07/27/[id number]

## 2014-07-09 NOTE — Progress Notes (Signed)
Patient has not voided since arriving to floor. Bladder scan shows >23000ml. Wife also concerned about patients extreme drowsiness. Patient has spoken very little since arriving to floor. Vital signs stable. Dr. Sherrie MustacheFisher paged at this time.

## 2014-07-09 NOTE — ED Notes (Signed)
Attempted to place NG in right nare. Placement verified x 2 RN with auscultation. Pt began coughing and NG tube coiled in pt mouth, pt tearful and unable to tolerate. NG removed.

## 2014-07-09 NOTE — H&P (Signed)
[facility] History and Physical  Richey Doolittle ZOX:[id number] DOB: [birth year] DOA: 07/23/2014  Referring physician: ED physician, Dr. Manus Gunning PCP: Kirstie Peri, MD   Chief Complaint: Nausea, vomiting,and possible hematemesis.  HPI: Mark Day is a 80 y.o. male with a history of recent hospitalization at [facility] from September to October 2015 with treatment for cervical and thoracic fractures and epidural hematoma following a fall at home. He also has a history of chronic atrial fibrillation-on Coumadin, chronic systolic heart failure, status post ICD, and hypertension. He presents from the [facility] with a report of nausea, vomiting, and possible hematemesis this morning. He was seen by nursing center physician, Dr. Leanord Hawking, who thought the patient may have acute diverticulitis. Therefore, Augmentin was started. It is unclear if the patient actually received a dose. The patient endorses nausea and vomiting, but he believes that  The "blood" was actually tomato juice he drank the night before. He denies abdominal pain, chest pain, chest congestion, or neck pain. He denies known black tarry stools or bright red blood per rectum.  In the ED, he was afebrile and hemodynamically stable. CT scan of his abdomen and pelvis revealed changes consistent with small bowel obstruction in the distal jejunum/proximal ileum; changes highly suggestive of bilateral pulmonary emboli; multiple gallstones without complicating factors, and diverticulosis without diverticulitis. EKG revealed atrial fibrillation with a heart rate of 109 bpm and PVC. His INR was 3.03, PHO Maybee was 7.3, hemoglobin was 12.3, glucose 101, potassium of 3.5, and proBNP of 3979. He is being admitted for further evaluation and management.     Review of Systems:  As above in the present illness. Also positive for generalized weakness. Otherwise negative.  Past Medical History  Diagnosis Date  . Osteoarthritis   .  Chronic diarrhea   . Hypertension   . Fracture of vertebra 10/2013    Lumbar  . Epidural hematoma 04/2014    Tx at [facility]  . Chronic systolic heart failure   . AICD (automatic cardioverter/defibrillator) present   . Atrial fibrillation     On coumadin  . Multiple fractures of cervical spine 04/2014    S/p tx at [facility]  . Tremor of both hands     Not Parkinson   Past Surgical History  Procedure Laterality Date  . Prostatectomy    . Colonoscopy    . Neck surgery     Social History: the patient is currently at the [facility] for rehabilitation. Otherwise, he lives in [place] with his wife. He has 2 children. He denies tobacco and illicit drug use. He drinks alcohol  No Known Allergies  Family History  Problem Relation Age of Onset  . Healthy Brother   . Healthy Son   . Healthy Son      Prior to Admission medications   Medication Sig Start Date End Date Taking? Authorizing Provider  ALPRAZolam (XANAX) 0.25 MG tablet Take 0.125 mg by mouth every 6 (six) hours as needed for anxiety.    Yes Historical Provider, MD  amoxicillin-clavulanate (AUGMENTIN) 875-125 MG per tablet Take 1 tablet by mouth 2 (two) times daily. Starting 07/06/2014 and ending 07/10/2014.   Yes Historical Provider, MD  aspirin 81 MG tablet Take 81 mg by mouth daily.   Yes Historical Provider, MD  atorvastatin (LIPITOR) 10 MG tablet Take 10 mg by mouth daily.   Yes Historical Provider, MD  escitalopram (LEXAPRO) 10 MG tablet Take 10 mg by mouth daily.   Yes Historical Provider, MD  lisinopril (PRINIVIL,ZESTRIL) 10  MG tablet Take 10 mg by mouth daily.   Yes Historical Provider, MD  metoprolol (LOPRESSOR) 50 MG tablet Take 50 mg by mouth 2 (two) times daily.   Yes Historical Provider, MD  warfarin (COUMADIN) 6 MG tablet Take 6 mg by mouth daily.   Yes Historical Provider, MD  docusate sodium (COLACE) 100 MG capsule Take 200 mg by mouth daily. 07/29/13   Malissa Hippo, MD  warfarin (COUMADIN) 4 MG tablet Take 4 mg by  mouth every other day. Alternate between 4mg  and 5mg     Historical Provider, MD  warfarin (COUMADIN) 5 MG tablet Take 5 mg by mouth every other day. Alternate between 4mg  and 5mg     Historical Provider, MD   Physical Exam: Filed Vitals:   06-Aug-2014 1200 08-06-14 1232 2014/08/06 1300 08-06-14 1409  BP: 121/97 135/92 117/80 102/76  Pulse: 84 79 95 93  Temp:      TempSrc:      Resp: 24 24 22 20   SpO2: 95% 100% 94% 93%    Wt Readings from Last 3 Encounters:  01/27/14 76.794 kg (169 lb 4.8 oz)  07/29/13 82.146 kg (181 lb 1.6 oz)  06/02/13 81.92 kg (180 lb 9.6 oz)    General: 80 year old who appears to be chronically ill, but in no acute distress. Eyes: PERRL,conjunctivae are mildly injected, sclerae are clear. ENT: small amount of blood coming from the right nares, status post attempted NG insertion, oropharynx reveals mildly dry mucous membranes. Neck: neck brace in place, extending down to the lower chest. Cardiovascular:irregular, irregular, with mild tachycardia; no pedal edema. Telemetry: atrial fibrillation  Respiratory: exam difficult because of the brace, but otherwise breathing is nonlabored and decreased breath sounds in the bases. Abdomen: positive bowel sounds, soft, nontender, nondistended. Skin: no rash or induration seen on limited exam Musculoskeletal: neck brace in place. Otherwise, no acute hot red joints. Psychiatric: semi-alert and answer questions appropriately when prompted. Neurologic: semi-alert, but becomes alert and oriented to himself and his family and hospital when prompted. He follows small commands. He lifts his legs against gravity upon request. He has a chronic tremor of both hands, right greater than left.          Labs on Admission:  Basic Metabolic Panel:  Recent Labs Lab 08-06-2014 0939 August 06, 2014 0949  NA 140 138  K 3.6* 3.5*  CL 95* 98  CO2 29  --   GLUCOSE 119* 121*  BUN 29* 34*  CREATININE 1.06 1.00  CALCIUM 10.5  --    Liver Function  Tests:  Recent Labs Lab August 06, 2014 0939  AST 23  ALT 14  ALKPHOS 139*  BILITOT 1.7*  PROT 7.8  ALBUMIN 3.8    Recent Labs Lab 08-06-14 0939  LIPASE 52   No results for input(s): AMMONIA in the last 168 hours. CBC:  Recent Labs Lab 08/06/14 0939 08-06-14 0949  WBC 9.2  --   NEUTROABS 6.8  --   HGB 13.8 15.0  HCT 41.0 44.0  MCV 95.6  --   PLT 228  --    Cardiac Enzymes:  Recent Labs Lab 08/06/14 0939  TROPONINI <0.30    BNP (last 3 results)  Recent Labs  August 06, 2014 0939  PROBNP 3979.0*   CBG:  Recent Labs Lab 08-06-14 1157 Aug 06, 2014 1336  GLUCAP 105* 101*    Radiological Exams on Admission: Ct Head Wo Contrast  06-Aug-2014   CLINICAL DATA:  Nausea and vomiting since yesterday, with large volume hematemesis this morning, initial evaluation  EXAM: CT HEAD WITHOUT CONTRAST  TECHNIQUE: Contiguous axial images were obtained from the base of the skull through the vertex without intravenous contrast.  COMPARISON:  04/21/2014  FINDINGS: Severe atrophy. Moderate low attenuation in the deep white matter. No evidence of acute infarct. No hemorrhage or extra-axial fluid. No evidence of mass or hydrocephalus. The calvarium is intact. There is an air-fluid level in the left sphenoid sinus. This was not present on the prior study.  IMPRESSION: Significant inflammatory change left sphenoid sinus suggesting acute sinusitis. No acute findings otherwise with significant chronic age-related involutional change.   Electronically Signed   By: Esperanza Heiraymond  Rubner M.D.   On: 07/03/2014 11:33   Ct Abdomen Pelvis W Contrast  07/12/2014   CLINICAL DATA:  Hematemesis  EXAM: CT ABDOMEN AND PELVIS WITH CONTRAST  TECHNIQUE: Multidetector CT imaging of the abdomen and pelvis was performed using the standard protocol following bolus administration of intravenous contrast.  CONTRAST:  25mL OMNIPAQUE IOHEXOL 300 MG/ML SOLN, 100mL OMNIPAQUE IOHEXOL 300 MG/ML SOLN  COMPARISON:  07/24/2012  FINDINGS:  Lung bases demonstrate bilateral basilar atelectasis. Additionally there are changes highly suggestive of bilateral pulmonary emboli although timing was not performed for pulmonary embolism evaluation. Heavy coronary calcifications are seen.  The gallbladder is well distended and demonstrates multiple small gallstones. No pericholecystic fluid is seen. The liver, spleen, adrenal glands and pancreas are within normal limits. The kidneys are well visualized bilaterally and demonstrate renal cystic change without obstructive change.  Multiple dilated loops of small bowel are identified with a transition point low in the left hemipelvis. No definitive mass lesion is noted in this region. The more distal small bowel in the ileum is within normal limits. The appendix is not well visualized although no inflammatory changes to suggest appendicitis are noted. Diffuse diverticular change is noted within the colon. No evidence of diverticulitis is seen. The bladder is partially distended. Prostatic calcifications are noted. No pelvic lymphadenopathy is seen. Diffuse aortoiliac calcifications are noted without aneurysmal dilatation. Chronic compression deformities at T12 and L1 are noted with changes of prior vertebral augmentation. Schmorl's node is noted in the superior endplate of L3.  IMPRESSION: Changes consistent with small bowel obstruction in the distal jejunum/proximal ileum. No definitive mass lesion is noted. These changes may be related to prior surgery with adhesions.  Changes highly suggestive of bilateral pulmonary emboli. CTA of the chest is recommended for further evaluation.  Multiple gallstones without complicating factors.  Chronic renal cystic change.  Diverticulosis without diverticulitis.   Electronically Signed   By: Alcide CleverMark  Lukens M.D.   On: 07/16/2014 11:42   Dg Abd Acute W/chest  07/03/2014   CLINICAL DATA:  Hematemesis  EXAM: ACUTE ABDOMEN SERIES (ABDOMEN 2 VIEW & CHEST 1 VIEW)  COMPARISON:   03/13/2014  FINDINGS: There is chronic moderate cardiomegaly. Aortic and hilar contours are unchanged given lower lung volumes. There is mild atelectasis at the bases. No evidence for edema or pneumonia.  Moderate distention of the stomach by fluid and gas. No evidence of small bowel or colonic obstruction. No visible pneumoperitoneum.  IMPRESSION: 1. Moderate distension of the stomach by gas and fluid. 2. No pneumoperitoneum. 3. Shallow inspiration with bibasilar atelectasis.   Electronically Signed   By: Tiburcio PeaJonathan  Watts M.D.   On: 07/05/2014 10:36    EKG: Independently reviewed. As above in history present illness.  Assessment/Plan Principal Problem:   Upper GI bleed Active Problems:   Hematemesis   SBO (small bowel obstruction)   Atrial fibrillation  AICD (automatic cardioverter/defibrillator) present   Chronic systolic heart failure   Nausea and vomiting   Multiple fractures of cervical spine   1. This is an 80 year old man with multiple medical conditions, who presents with a possible upper GI bleed with hematemesis, and nausea and vomiting, presumed to be secondary to a small bowel obstruction. The family requests that the patient stay here if he can be managed medically. I have discussed the patient with gastroenterologist, Dr. Karilyn Cotaehman and he is willing to perform an EGD tomorrow morning to assess for an etiology. I had discussed the patient with general surgeon, Dr. Lovell SheehanJenkins prior to my conversation with the family. [facility]. In my conversation with Dr. Lovell SheehanJenkins, he felt that the patient could be managed medically with an NG tube decompression. There was also concern that the patient has an ICD and chronic systolic heart failure which would make surgery here not as likely. NG tube insertion was attempted, but the patient could not tolerate it. He is currently stable. We'll try to treat him medically with scheduled IV Zofran and consider trying  NG tube decompression again if he continues to have nausea and vomiting. 2. Upper GI bleed/hematemesis. This is in the setting of warfarin coagulopathy. Will start IV Protonix and scheduled IV Zofran. We'll give gentle IV fluids. Will assess his CBC every 6 hours.his hemoglobin is currently normal, but I expected to decrease with hemodilution and equilibration. We'll make him nothing by mouth. 3. Small bowel obstruction. Per the CT scan, it does not appear to be high-grade. He has a history of prostatectomy in the past. He likely has adhesive disease. NG tube was attempted, but the patient could not tolerated. Will treat the patient medically with IV antiemetics, IV analgesics, and supportive treatment. Will discuss further with Dr. Lovell SheehanJenkins. 4. Chronic atrial fibrillation, on chronic Coumadin. Will hold Coumadin. Will give fresh frozen plasma tomorrow morning and one small dose of vitamin K today in anticipation of EGD tomorrow. We'll hold oral metoprolol and give IV metoprolol. Rate currently reasonable. 5. Chronic systolic heart failure, status post ICD. His ejection fraction is unknown at this time. Will order a 2-D echocardiogram. 6. Recent fall with cervical spine and thoracic spine fractures and history of epidural hematoma. We'll continue the brace. We will order PT evaluation once he is more stable. 7. Question of PE on contrasted CT of the abdomen and pelvis. This was seem less likely given that his INR is more than 3. Nevertheless, will order a VQ scan for further evaluation.    Code Status: full code. DVT Prophylaxis:on Coumadin. Family Communication: discussed with wife and caretaker. Disposition Plan: discharge back to the pain center when clinically appropriate.  Time spent: one hour and 15 minutes.  [facility] Hospital At FairviewFISHER,Adriena Manfre [facility] Pager [phone]

## 2014-07-09 NOTE — Care Management Utilization Note (Signed)
UR completed 

## 2014-07-09 NOTE — Progress Notes (Deleted)
Physician paged to notify that patients arterial line came out. Patients family at bedside.

## 2014-07-09 NOTE — Progress Notes (Addendum)
RN paged secondary to pt retaining urine and per wife, is more lethargic. Reviewed chart. Pt has an order for Foley already. Pt was lethargic in ED but apparently talking and following commands. Now, per RN, is moving around in bed, but not conversant nor following commands. MOE x 4. This NP reviewed notes, labs, tests. CT head already done was neg for acute. Ammonia level normal. Lactate slightly high. Creat normal. No electrolyte imbalance. Hgb stable.  Will get stat labs and monitor. Per RN, pt is having no increased WOB or desaturations on 4L [place]. ABG earlier with only slightly elevated PCO2 (48). Do not see a reason to r/p this at this time. He is acutely ill with multiple health issues. For now, will continue to watch, await labs, and call MD at [facility] to see pt if needed.  Jimmye NormanKaren Kirby-Graham, NP [facility] Update: Labs returned and look fine. LA is down. No change in pt. Remains sleepy, but no focal neuro deficits and is not obtunded per RN. Craige CottaKirby, NP

## 2014-07-09 NOTE — ED Notes (Signed)
Pt increasingly lethargic. EDP and family at bedside. CBG-105, VSS, ABG ordered. Pt responding to tactile stimuli and following commands.

## 2014-07-10 ENCOUNTER — Encounter (HOSPITAL_COMMUNITY): Payer: Self-pay | Admitting: Internal Medicine

## 2014-07-10 DIAGNOSIS — K922 Gastrointestinal hemorrhage, unspecified: Secondary | ICD-10-CM

## 2014-07-10 DIAGNOSIS — K920 Hematemesis: Secondary | ICD-10-CM

## 2014-07-10 DIAGNOSIS — I5022 Chronic systolic (congestive) heart failure: Secondary | ICD-10-CM

## 2014-07-10 DIAGNOSIS — R112 Nausea with vomiting, unspecified: Secondary | ICD-10-CM

## 2014-07-10 DIAGNOSIS — K5669 Other intestinal obstruction: Secondary | ICD-10-CM

## 2014-07-10 LAB — CBC
HCT: 34.1 % — ABNORMAL LOW (ref 39.0–52.0)
HEMATOCRIT: 36.5 % — AB (ref 39.0–52.0)
HEMOGLOBIN: 12.1 g/dL — AB (ref 13.0–17.0)
Hemoglobin: 11.4 g/dL — ABNORMAL LOW (ref 13.0–17.0)
MCH: 32.4 pg (ref 26.0–34.0)
MCH: 32.5 pg (ref 26.0–34.0)
MCHC: 33.4 g/dL (ref 30.0–36.0)
MCHC: 33.2 g/dL (ref 30.0–36.0)
MCV: 96.9 fL (ref 78.0–100.0)
MCV: 98.1 fL (ref 78.0–100.0)
PLATELETS: 168 10*3/uL (ref 150–400)
Platelets: 170 10*3/uL (ref 150–400)
RBC: 3.52 MIL/uL — ABNORMAL LOW (ref 4.22–5.81)
RBC: 3.72 MIL/uL — AB (ref 4.22–5.81)
RDW: 16.7 % — ABNORMAL HIGH (ref 11.5–15.5)
RDW: 16.7 % — ABNORMAL HIGH (ref 11.5–15.5)
WBC: 6.3 10*3/uL (ref 4.0–10.5)
WBC: 6.7 10*3/uL (ref 4.0–10.5)

## 2014-07-10 LAB — COMPREHENSIVE METABOLIC PANEL
ALT: 14 U/L (ref 0–53)
AST: 21 U/L (ref 0–37)
Albumin: 3 g/dL — ABNORMAL LOW (ref 3.5–5.2)
Alkaline Phosphatase: 112 U/L (ref 39–117)
Anion gap: 10 (ref 5–15)
BILIRUBIN TOTAL: 1.1 mg/dL (ref 0.3–1.2)
BUN: 24 mg/dL — AB (ref 6–23)
CHLORIDE: 103 meq/L (ref 96–112)
CO2: 27 meq/L (ref 19–32)
CREATININE: 1.02 mg/dL (ref 0.50–1.35)
Calcium: 9.1 mg/dL (ref 8.4–10.5)
GFR calc Af Amer: 73 mL/min — ABNORMAL LOW (ref >90)
GFR calc non Af Amer: 63 mL/min — ABNORMAL LOW (ref >90)
Glucose, Bld: 109 mg/dL — ABNORMAL HIGH (ref 70–99)
Potassium: 3.8 mEq/L (ref 3.7–5.3)
Sodium: 140 mEq/L (ref 137–147)
Total Protein: 6.2 g/dL (ref 6.0–8.3)

## 2014-07-10 LAB — PREPARE FRESH FROZEN PLASMA: Unit division: 0

## 2014-07-10 LAB — PROTIME-INR
INR: 3.05 — ABNORMAL HIGH (ref 0.00–1.49)
INR: 2.81 — ABNORMAL HIGH (ref 0.00–1.49)
INR: 1.87 — ABNORMAL HIGH (ref 0.00–1.49)
PROTHROMBIN TIME: 31.8 s — AB (ref 11.6–15.2)
PROTHROMBIN TIME: 29.8 s — AB (ref 11.6–15.2)
Prothrombin Time: 21.7 seconds — ABNORMAL HIGH (ref 11.6–15.2)

## 2014-07-10 LAB — APTT: aPTT: 43 seconds — ABNORMAL HIGH (ref 24–37)

## 2014-07-10 MED ORDER — BISACODYL 10 MG RE SUPP
10.0000 mg | Freq: Once | RECTAL | Status: AC
  Administered 2014-07-10: 10 mg via RECTAL
  Filled 2014-07-10: qty 1

## 2014-07-10 MED ORDER — SODIUM CHLORIDE 0.9 % IV SOLN: Freq: Once | INTRAVENOUS | Status: AC

## 2014-07-10 MED ORDER — VITAMIN K1 10 MG/ML IJ SOLN
3.0000 mg | Freq: Once | INTRAVENOUS | Status: AC
  Administered 2014-07-10: 3 mg via INTRAVENOUS
  Filled 2014-07-10: qty 0.3

## 2014-07-10 NOTE — Care Management Note (Addendum)
  Page 2 of 2   07/20/2014     12:28:04 PM CARE MANAGEMENT NOTE 07/20/2014  Patient:  [name] Day,[name]   Account Number:  [id number]  Date Initiated:  07/10/2014  Documentation initiated by:  Kathyrn SheriffHILDRESS,JESSICA  Subjective/Objective Assessment:   [facility] recent back injury.     Action/Plan:   Pt plans to discahrge back to SNF [facility] when appropriate. No CM needs identified at this time. CSW aware of D/C plan.   Anticipated DC Date:  07/13/2014   Anticipated DC Plan:  SKILLED NURSING FACILITY  In-house referral  Clinical Social Worker      DC Planning Services  CM consult      [facility]  NA   Choice offered to / List presented to:             Status of service:  Completed, signed off Medicare Important Message given?  YES (If response is "NO", the following Medicare IM given date fields will be blank) Date Medicare IM given:  07/20/2014 Medicare IM given by:  Betzayda Braxton Date Additional Medicare IM given:  07/16/2014 Additional Medicare IM given by:  Maycee Blasco  Discharge Disposition:  SKILLED NURSING FACILITY  Per UR Regulation:  Reviewed for med. necessity/level of care/duration of stay  If discussed at Long Length of Stay Meetings, dates discussed:   07/14/2014  07/21/2014    Comments:  [name] RN, BSN, MSHL, CCM  Nurse - Case Manager,  (Unit [facility])  [phone]  07/20/2014 TPN and IV Protonix, IV Magnesium, IV Cardizem Dispo Plan:  Return to SNF (SW/Donna Crowder aware:  see SW note for further details)    [name] RN, BSN, MSHL, CCM  Nurse - Case Manager,  (Unit [place])  [phone]  07/16/2014 Social: [facility]. Pt plans to discahrge back to SN[facility] when appropriate. Intensity:  Small Bowel obstruction,  TPN & Picc on 11/19 Dispo Plan:  Return to SNF (SW aware)   07/14/2014 1330 Kathyrn SheriffJessica Childress, RN, MSN,  [facility] 07/10/2014 1300 Kathyrn SheriffJessica Childress, RN, MSN, [facility]

## 2014-07-10 NOTE — Clinical Social Work Psychosocial (Signed)
Clinical Social Work Department BRIEF PSYCHOSOCIAL ASSESSMENT 07/10/2014  Patient:  [name] Day, [name] Day     Account Number:  [id number]     Admit date:  06/30/2014  Clinical Social Worker:  Legrand Como  Date/Time:  07/10/2014 12:35 PM  Referred by:  CSW  Date Referred:  07/10/2014 Referred for  SNF Placement   Other Referral:   Interview type:  Patient Other interview type:   Wife, [name] Day  [name] Day-PNC    PSYCHOSOCIAL DATA Living Status:  FACILITY Admitted from facility:  [facility] Level of care:  [place] Primary support name:  [name] Day Primary support relationship to patient:  SPOUSE Degree of support available:   Patient's wife and family are very supportive    CURRENT CONCERNS Current Concerns  Post-Acute Placement   Other Concerns:    SOCIAL WORK ASSESSMENT / PLAN CSW met with patient and wife. Patient was minimally verbal. He was oriented to x4.  Patient's wife provided the history. [name] Day indicated that patient had been a resident at [facility] since 06/01/14. She indicated that patient the placment was necessary due to patient experiencing a "bad fall" on 05/25/14.  [name] Day indicated that prior to the fall patient utilized a walker for ambulation and her required minimal assistance.  She indicated that prior to March of this year patient was totally independent and required no assistance.  [name] Day indicated that patient began experiencing more frequent falls around March. [name] Day indicated that she and her sons are very supportive to patient. She indicated that she wants patient to return to [facility] upon discharge.  CSW spoke with [name] Day at [facility].  [name] Day indicated that patient had only been in the facility for a little over a month. She confirmed statements given by patient's wife.  She indicated that patient was able to return to  the facility upon discharge.   Assessment/plan status:   Other assessment/ plan:    Information/referral to community resources:    PATIENT'S/FAMILY'S RESPONSE TO PLAN OF CARE: Patient and family agreeable to return to [facility] upon discharge.    [name] Day, [name] Day

## 2014-07-10 NOTE — Consult Note (Signed)
Referring Provider: Elliot Cousin, MD Primary Care Physician:  Kirstie Peri, MD Primary Gastroenterologist:  Dr. Karilyn Cota  Reason for Consultation:    Upper GI bleed and small bowel obstruction.  HPI:   Patient is 80 year old Caucasian male with multiple medical problems who was admitted to Dr. Theodis Aguas service yesterday via emergency room where he presented with three-day history of nausea and vomiting presumed to be due to gastroenteritis and acute onset of coffee-ground emesis yesterday. Patient was in usual state of health until 05/25/2014 when he fell and was taken to emergency room at [facility] and found to have fracture to left parietal bone and scalp hematoma. Patient is on warfarin chronically. He also had cervical spine CT showing degenerative changes but no fracture. Patient was transferred to [facility] and on follow-up studies he was noted to have fracture to cervical vertebrae and neck immobilized with collar. Regarding scalp hematoma observation was advised. After 2 weeks he was discharged and transferred to pain center and had been doing well. About 3 days ago he developed nausea and vomiting without diarrhea and also had warts stomach ache". Yesterday morning he vomited coffee-ground material at which time he was transferred to ER for further evaluation. Patient had normal stool 2 days ago. There is no history of melena or rectal bleeding. His INR was 3.03 on admission. He was given single unit of FFP and vitamin K 2 mg subcutaneously but his INR only corrected to 2.81. He was given another 3 mg of vitamin K. Patient denies nausea vomiting heartburn chest pain or shortness of breath. He also denies abdominal pain. He has not passed flat is in the last 24 hours. His appetite has not been good but this is because he's not been able to eat regular food as he is not able to chew. He may have lost a few pounds. Patient has been experiencing falling episodes since March this year and he has been  receiving physical therapy. These episodes are presumed to be due to imbalance rather than hypotension or syncope. Patient has history of irritable bowel syndrome and was last seen by me on 01/27/2014 and has done well with therapy.   Past Medical History  Diagnosis Date  . Osteoarthritis   . Chronic diarrhea   . Hypertension   . Fracture of vertebra 10/2013    Lumbar  . Epidural hematoma 04/2014     subarachnoid hemorrhage status post fall-Tx at [facility]  . Chronic systolic heart failure 04/2014    at [facility]-ejection fraction 45-50%; severe ulnar hypertension  . AICD (automatic cardioverter/defibrillator) present   . Atrial fibrillation     On coumadin  . Multiple fractures of thoracic spine 04/2014    S/p tx at [facility]  . Cervical spine fracture 04/2014    Tx at [facility]  . Cholelithiasis 07/12/2014  . Tremor of both hands   . Chronic anticoagulation     With warfarin  . Pulmonary hypertension 04/2014    per echo at [facility]  . Atrial dilatation, bilateral 04/2014    per echo at [facility]; severe    Past Surgical History  Procedure Laterality Date  . Prostatectomy    . Colonoscopy    . Neck surgery      Prior to Admission medications   Medication Sig Start Date End Date Taking? Authorizing Provider  ALPRAZolam (XANAX) 0.25 MG tablet Take 0.125 mg by mouth every 6 (six) hours as needed for anxiety.    Yes Historical Provider, MD  amoxicillin-clavulanate (AUGMENTIN) 875-125 MG per tablet  Take 1 tablet by mouth 2 (two) times daily. Starting 07/06/2014 and ending 07/10/2014.   Yes Historical Provider, MD  aspirin 81 MG tablet Take 81 mg by mouth daily.   Yes Historical Provider, MD  atorvastatin (LIPITOR) 10 MG tablet Take 10 mg by mouth daily.   Yes Historical Provider, MD  escitalopram (LEXAPRO) 10 MG tablet Take 10 mg by mouth daily.   Yes Historical Provider, MD  lisinopril (PRINIVIL,ZESTRIL) 10 MG tablet Take 10 mg by mouth daily.   Yes Historical Provider, MD  metoprolol  (LOPRESSOR) 50 MG tablet Take 50 mg by mouth 2 (two) times daily.   Yes Historical Provider, MD  warfarin (COUMADIN) 6 MG tablet Take 6 mg by mouth daily.   Yes Historical Provider, MD  docusate sodium (COLACE) 100 MG capsule Take 200 mg by mouth daily. 07/29/13   Malissa HippoNajeeb U Rehman, MD  warfarin (COUMADIN) 4 MG tablet Take 4 mg by mouth every other day. Alternate between 4mg  and 5mg     Historical Provider, MD  warfarin (COUMADIN) 5 MG tablet Take 5 mg by mouth every other day. Alternate between 4mg  and 5mg     Historical Provider, MD    Current Facility-Administered Medications  Medication Dose Route Frequency Provider Last Rate Last Dose  . 0.9 %  sodium chloride infusion   Intravenous Once Elliot Cousinenise Fisher, MD      . antiseptic oral rinse (CPC / CETYLPYRIDINIUM CHLORIDE 0.05%) solution 7 mL  7 mL Mouth Rinse BID Elliot Cousinenise Fisher, MD   7 mL at 07/10/14 1248  . dextrose 5 % and 0.9 % NaCl with KCl 20 mEq/L infusion   Intravenous Continuous Elliot Cousinenise Fisher, MD 75 mL/hr at 07/10/14 0447    . levalbuterol (XOPENEX) nebulizer solution 0.63 mg  0.63 mg Nebulization Q6H PRN Elliot Cousinenise Fisher, MD      . metoprolol (LOPRESSOR) injection 5 mg  5 mg Intravenous Q4H Elliot Cousinenise Fisher, MD   5 mg at 07/10/14 1232  . morphine 2 MG/ML injection 1 mg  1 mg Intravenous Q4H PRN Elliot Cousinenise Fisher, MD   1 mg at 07/01/2014 2345  . ondansetron (ZOFRAN) injection 4 mg  4 mg Intravenous 4 times per day Elliot Cousinenise Fisher, MD   4 mg at 07/10/14 1231  . pantoprazole (PROTONIX) injection 40 mg  40 mg Intravenous Q12H Elliot Cousinenise Fisher, MD   40 mg at 07/10/14 1231    Allergies as of 06/30/2014  . (No Known Allergies)    Family History  Problem Relation Age of Onset  . Healthy Brother   . Healthy Son   . Healthy Son     History   Social History  . Marital Status: Married    Spouse Name: N/A    Number of Children: N/A  . Years of Education: N/A   Occupational History  . Not on file.   Social History Main Topics  . Smoking status: Never  Smoker   . Smokeless tobacco: Never Used  . Alcohol Use: Yes     Comment: Patient states that he drinks a bottle of beer every night.  . Drug Use: No  . Sexual Activity: Not on file   Other Topics Concern  . Not on file   Social History Narrative    Review of Systems: See HPI, otherwise normal ROS  Physical Exam: Temp:  [96.2 F (35.7 C)-97.2 F (36.2 C)] 97.1 F (36.2 C) (11/13 1452) Pulse Rate:  [67-93] 69 (11/13 1500) Resp:  [12-23] 21 (11/13 1500) BP: (95-132)/(63-91) 119/76 mmHg (  11/13 1500) SpO2:  [84 %-100 %] 100 % (11/13 1500) Weight:  [153 lb 10.6 oz (69.7 kg)-161 lb 14.4 oz (73.437 kg)] 161 lb 14.4 oz (73.437 kg) (11/13 0500) Last BM Date: 07/08/14 Patient is alert and responds appropriately to questions. Conjunctiva was pink. Sclerae nonicteric. Oropharyngeal mucosa is normal. He has neck collar therefore neck could not be examined. Cardiac exam with irregular rhythm normal S1 and S2. No murmur or gallop noted. Lungs are clear to auscultation. Abdomen is symmetrical. Bowel sounds are hypoactive. On palpation abdomen is soft and nontender without organomegaly or masses. Rectal examination reveals small amount of soft formed stool in the vault and it is guaiac-negative. Extremities are thin but no clubbing or edema noted.   Lab Results:  Recent Labs  07/07/2014 2002 07/10/14 0219 07/10/14 0818  WBC 7.1 6.3 6.7  HGB 11.8* 11.4* 12.1*  HCT 35.4* 34.1* 36.5*  PLT 176 168 170   BMET  Recent Labs  07/14/2014 0939 07/10/2014 0949 06/29/2014 2002 07/10/14 0219  NA 140 138 139 140  K 3.6* 3.5* 3.7 3.8  CL 95* 98 102 103  CO2 29  --  27 27  GLUCOSE 119* 121* 115* 109*  BUN 29* 34* 27* 24*  CREATININE 1.06 1.00 1.05 1.02  CALCIUM 10.5  --  9.2 9.1   LFT  Recent Labs  07/10/14 0219  PROT 6.2  ALBUMIN 3.0*  AST 21  ALT 14  ALKPHOS 112  BILITOT 1.1   PT/INR  Recent Labs  07/10/14 0219 07/10/14 0903  LABPROT 31.8* 29.8*  INR 3.05* 2.81*    Hepatitis Panel No results for input(s): HEPBSAG, HCVAB, HEPAIGM, HEPBIGM in the last 72 hours.  Studies/Results: Ct Head Wo Contrast  07/18/2014   CLINICAL DATA:  Nausea and vomiting since yesterday, with large volume hematemesis this morning, initial evaluation  EXAM: CT HEAD WITHOUT CONTRAST  TECHNIQUE: Contiguous axial images were obtained from the base of the skull through the vertex without intravenous contrast.  COMPARISON:  04/21/2014  FINDINGS: Severe atrophy. Moderate low attenuation in the deep white matter. No evidence of acute infarct. No hemorrhage or extra-axial fluid. No evidence of mass or hydrocephalus. The calvarium is intact. There is an air-fluid level in the left sphenoid sinus. This was not present on the prior study.  IMPRESSION: Significant inflammatory change left sphenoid sinus suggesting acute sinusitis. No acute findings otherwise with significant chronic age-related involutional change.   Electronically Signed   By: Esperanza Heiraymond  Rubner M.D.   On: 07/04/2014 11:33   Nm Pulmonary Perfusion  07/20/2014   CLINICAL DATA:  Shortness of breath at nursing home today, history hypertension, atrial fibrillation, chronic systolic heart failure  EXAM: NUCLEAR MEDICINE PERFUSION SCAN  TECHNIQUE: Perfusion images were obtained in multiple projections after intravenous injection of radiopharmaceutical. Patient was unable to cooperate and perform of an adequate ventilation exam.  RADIOPHARMACEUTICALS:  6 mCi Tc1050m MAA  COMPARISON:  None; correlation chest radiograph 07/17/2014 and CT abdomen/pelvis 07/13/2014  FINDINGS: Enlargement of cardiac silhouette.  Blunting of perfusion at the posterior RIGHT lower lobe secondary to pleural effusion.  No segmental or subsegmental perfusion defects identified.  IMPRESSION: Enlargement of cardiac silhouette and RIGHT pleural effusion.  No other segmental or subsegmental perfusion defects.  Findings represent a very low probability for pulmonary embolism.    Electronically Signed   By: Ulyses SouthwardMark  Boles M.D.   On: 07/04/2014 16:59   Ct Abdomen Pelvis W Contrast  07/27/2014   CLINICAL DATA:  Hematemesis  EXAM:  CT ABDOMEN AND PELVIS WITH CONTRAST  TECHNIQUE: Multidetector CT imaging of the abdomen and pelvis was performed using the standard protocol following bolus administration of intravenous contrast.  CONTRAST:  25mL OMNIPAQUE IOHEXOL 300 MG/ML SOLN, OMNIPAQUE IOHEXOL 300 MG/ML SOLN  COMPARISON:  07/24/2012  FINDINGS: Lung bases demonstrate bilateral basilar atelectasis. Additionally there are changes highly suggestive of bilateral pulmonary emboli although timing was not performed for pulmonary embolism evaluation. Heavy coronary calcifications are seen.  The gallbladder is well distended and demonstrates multiple small gallstones. No pericholecystic fluid is seen. The liver, spleen, adrenal glands and pancreas are within normal limits. The kidneys are well visualized bilaterally and demonstrate renal cystic change without obstructive change.  Multiple dilated loops of small bowel are identified with a transition point low in the left hemipelvis. No definitive mass lesion is noted in this region. The more distal small bowel in the ileum is within normal limits. The appendix is not well visualized although no inflammatory changes to suggest appendicitis are noted. Diffuse diverticular change is noted within the colon. No evidence of diverticulitis is seen. The bladder is partially distended. Prostatic calcifications are noted. No pelvic lymphadenopathy is seen. Diffuse aortoiliac calcifications are noted without aneurysmal dilatation. Chronic compression deformities at T12 and L1 are noted with changes of prior vertebral augmentation. Schmorl's node is noted in the superior endplate of L3.  IMPRESSION: Changes consistent with small bowel obstruction in the distal jejunum/proximal ileum. No definitive mass lesion is noted. These changes may be related to prior  surgery with adhesions.  Changes highly suggestive of bilateral pulmonary emboli. CTA of the chest is recommended for further evaluation.  Multiple gallstones without complicating factors.  Chronic renal cystic change.  Diverticulosis without diverticulitis.   Electronically Signed   By: Alcide Clever M.D.   On:  11:42   Dg Abd Acute W/chest  07/04/2014   CLINICAL DATA:  Hematemesis  EXAM: ACUTE ABDOMEN SERIES (ABDOMEN 2 VIEW & CHEST 1 VIEW)  COMPARISON:  03/13/2014  FINDINGS: There is chronic moderate cardiomegaly. Aortic and hilar contours are unchanged given lower lung volumes. There is mild atelectasis at the bases. No evidence for edema or pneumonia.  Moderate distention of the stomach by fluid and gas. No evidence of small bowel or colonic obstruction. No visible pneumoperitoneum.  IMPRESSION: 1. Moderate distension of the stomach by gas and fluid. 2. No pneumoperitoneum. 3. Shallow inspiration with bibasilar atelectasis.   Electronically Signed   By: Tiburcio Pea M.D.   On: 07/27/2014 10:36   I have reviewed imaging studies with Dr. Ulyses Southward.  Assessment;  Patient is 80 year old Caucasian male was admitted yesterday with acute onset of coffee-ground emesis and three-day history of nausea and vomiting. Patient is anticoagulated.  Abdominopelvic CT on admission revealed changes of small bowel obstruction. Suspect small bowel obstruction secondary to adhesions. He seems to have improved in the last 24 hours since he has not been vomiting and has been free of abdominal pain. Upper GI bleed possibly due to Mallory-Weiss tear although he could have peptic ulcer disease or GI angiodysplasia. Will consider EGD when INR is below 2. I may be able to partially decompress him at endoscopy.  Recommendations; Continue NPO status and IV PPI NG tube placement was attempted but aborted because patient could not tolerate it. Dulcolax suppository 1 tonight. CBC metabolic 7 and INR in  a.m. Diagnostic esophagogastroduodenoscopy in a.m.   LOS: 1 day   REHMAN,NAJEEB U  07/10/2014, 3:13 PM

## 2014-07-10 NOTE — Clinical Social Work Note (Signed)
CSW spoke with [name]. She indicated that patient would not need a new FL2  Castin Donaghue, LCSW  [phone] 

## 2014-07-10 NOTE — Progress Notes (Signed)
INITIAL NUTRITION ASSESSMENT  DOCUMENTATION CODES Per approved criteria  -Not Applicable   INTERVENTION: Follow for diet advancement    NUTRITION DIAGNOSIS: Inadequate oral intake related to Upper GI bleed  as evidenced by NPO   Goal: Pt to meet >/= 90% of their estimated nutrition needs    Monitor:  Po intake, labs and wt trends   Reason for Assessment: Low Braden  80 y.o. male  Admitting Dx: Upper GI bleed  ASS[facility]MENT: [facility] He has upper GIB, hematemesis, acute blood loss anemia, possible small bowel obstruction, chronic CHF, multiple fx of thoracic spine, cholelithiasis. Pt has 10% wt loss this past year. He is at risk for malnutrition due to his upper GI bleed/NPO status which is limiting his ability to take in nutrition orally. Nutrition focused exam deferred at this time.  Height: Ht Readings from Last 1 Encounters:  02-12-14 6\' 1"  (1.854 m)    Weight: Wt Readings from Last 1 Encounters:  07/10/14 161 lb 14.4 oz (73.437 kg)    Ideal Body Weight:  184#  % Ideal Body Weight: 88%  Wt Readings from Last 10 Encounters:  07/10/14 161 lb 14.4 oz (73.437 kg)  01/27/14 169 lb 4.8 oz (76.794 kg)  07/29/13 181 lb 1.6 oz (82.146 kg)  06/02/13 180 lb 9.6 oz (81.92 kg)    Usual Body Weight: 170-180#   % Usual Body Weight:  90%  BMI:  Body mass index is 21.36 kg/(m^2). normal range  Estimated Nutritional Needs: Kcal: 1800-2000 Protein: 95-105 gr Fluid: 1.8-1.9 liters daily  Skin: intact  Diet Order: Diet NPO time specified  EDUCATION NEEDS: -No education needs identified at this time   Intake/Output Summary (Last 24 hours) at 07/10/14 1232 Last data filed at 07/10/14 0500  Gross per 24 hour  Intake      0 ml  Output    300 ml  Net   -300 ml    Last BM: 11/11  Labs:   Recent Labs Lab 02-12-14 0939 02-12-14 0949 02-12-14 1443 02-12-14 2002 07/10/14 0219  NA 140 138  --  139 140  K 3.6* 3.5*  --  3.7 3.8  CL 95* 98  --  102 103   CO2 29  --   --  27 27  BUN 29* 34*  --  27* 24*  CREATININE 1.06 1.00  --  1.05 1.02  CALCIUM 10.5  --   --  9.2 9.1  MG  --   --  1.8  --   --   GLUCOSE 119* 121*  --  115* 109*    CBG (last 3)   Recent Labs  02-12-14 1157 02-12-14 1336 02-12-14 1824  GLUCAP 105* 101* 114*    Scheduled Meds: . sodium chloride   Intravenous Once  . sodium chloride   Intravenous Once  . antiseptic oral rinse  7 mL Mouth Rinse BID  . metoprolol  5 mg Intravenous Q4H  . ondansetron (ZOFRAN) IV  4 mg Intravenous 4 times per day  . pantoprazole (PROTONIX) IV  40 mg Intravenous Q12H  . phytonadione (VITAMIN K) IV  3 mg Intravenous Once    Continuous Infusions: . dextrose 5 % and 0.9 % NaCl with KCl 20 mEq/L 75 mL/hr at 07/10/14 0447    Past Medical History  Diagnosis Date  . Osteoarthritis   . Chronic diarrhea   . Hypertension   . Fracture of vertebra 10/2013    Lumbar  . Epidural hematoma 04/2014  [facility]  . Chronic systolic heart failure 04/2014    at [facility]-ejection fraction 45-50%; severe ulnar hypertension  . AICD (automatic cardioverter/defibrillator) present   . Atrial fibrillation     On coumadin  . Multiple fractures of thoracic spine 04/2014    [facility]  . Cervical spine fracture 04/2014    [facility]  . Cholelithiasis 07/05/2014  . Tremor of both hands   . Chronic anticoagulation     With warfarin  . Pulmonary hypertension 04/2014    [facility]  . Atrial dilatation, bilateral 04/2014    [facility]    Past Surgical History  Procedure Laterality Date  . Prostatectomy    . Colonoscopy    . Neck surgery      Royann ShiversLynn Kruz Chiu MS,RD,CSG,LDN Office: #[phone] Pager: [phone]

## 2014-07-10 NOTE — Progress Notes (Signed)
[facility] PROGRESS NOTE  Dia Jefferys OZH:[id number] DOB: 06-21-1923 DOA: 07/22/2014 PCP: Kirstie Peri, MD    Code Status: full code Family Communication: discussed with wife yesterday; pending follow-up discussion Disposition Plan: discharge to the [facility] when clinically appropriate   Consultants:  Gastroenterologist, Dr. Karilyn Cota  General surgeon, Dr. Lovell Sheehan  Procedures:  none  Antibiotics:  none  HPI/Subjective: The patient has been intermittently lethargic according to nursing. When he is asked, he says that he wants to catch up on his sleep. Otherwise, he has no complaints of chest pain, chest congestion, shortness of breath, abdominal pain, nausea, and vomiting at rest. He does say that his abdomen is "sore" when every body "presses on it". He still does not believe that he vomited blood but rather he vomited tomato soup.   Objective: Filed Vitals:   07/10/14 0400  BP:   Pulse:   Temp: 97 F (36.1 C)  Resp:    Pulse 84. Respiratory rate 12. Blood pressure 102/72. Oxygen saturation 100% on nasal cannula oxygen.    Intake/Output Summary (Last 24 hours) at 07/10/14 0829 Last data filed at 07/10/14 0500  Gross per 24 hour  Intake      0 ml  Output    300 ml  Net   -300 ml   Filed Weights   06/28/2014 1538 07/10/14 0500  Weight: 69.7 kg (153 lb 10.6 oz) 73.437 kg (161 lb 14.4 oz)    Exam:   General:  Chronically ill-appearing 80 year old man who was sleeping, but became alert and oriented when prompted.  Neck: Large neck brace on extending to mid chest.  Cardiovascular: irregular, irregular  Respiratory: exam difficult with brace on, but clear anteriorly and decreased breath sounds in the bases.  Abdomen:hypoactive bowel sounds, soft, minimally distended, and minimal hypogastric tenderness; no masses palpated.  Musculoskeletal: no acute hot red joints; pedal pulses palpable; no pedal edema.  Neurologic: He was initially asleep, but  became alert and oriented to himself and hospital. His speech is clear. His reported history appears appropriate. Hand grip bilaterally is 5 minus over 5. He has a chronic bilateral hand/arm tremor-which he says has been chronic for many years.   Data Reviewed: Basic Metabolic Panel:  Recent Labs Lab 07/27/2014 0939 07/20/2014 0949 07/03/2014 1443 07/17/2014 2002 07/10/14 0219  NA 140 138  --  139 140  K 3.6* 3.5*  --  3.7 3.8  CL 95* 98  --  102 103  CO2 29  --   --  27 27  GLUCOSE 119* 121*  --  115* 109*  BUN 29* 34*  --  27* 24*  CREATININE 1.06 1.00  --  1.05 1.02  CALCIUM 10.5  --   --  9.2 9.1  MG  --   --  1.8  --   --    Liver Function Tests:  Recent Labs Lab 07/23/2014 0939 07/10/14 0219  AST 23 21  ALT 14 14  ALKPHOS 139* 112  BILITOT 1.7* 1.1  PROT 7.8 6.2  ALBUMIN 3.8 3.0*    Recent Labs Lab  0939  LIPASE 52    Recent Labs Lab 07/15/2014 1355  AMMONIA 23   CBC:  Recent Labs Lab 07/11/2014 0939 07/22/2014 0949 07/15/2014 1442 07/02/2014 2002 07/10/14 0219  WBC 9.2  --  8.3 7.1 6.3  NEUTROABS 6.8  --   --   --   --   HGB 13.8 15.0 12.3* 11.8* 11.4*  HCT 41.0 44.0 36.1* 35.4* 34.1*  MCV 95.6  --  95.8 96.2 96.9  PLT 228  --  192 176 168   Cardiac Enzymes:  Recent Labs Lab  0939  TROPONINI <0.30   BNP (last 3 results)  Recent Labs  07/01/2014 0939  PROBNP 3979.0*   CBG:  Recent Labs Lab 07/11/2014 1157 07/10/2014 1336 07/12/2014 1824  GLUCAP 105* 101* 114*    Recent Results (from the past 240 hour(s))  MRSA PCR Screening     Status: None   Collection Time: 06/29/2014  3:15 PM  Result Value Ref Range Status   MRSA by PCR NEGATIVE NEGATIVE Final    Comment:        The GeneXpert MRSA Assay (FDA approved for NASAL specimens only), is one component of a comprehensive MRSA colonization surveillance program. It is not intended to diagnose MRSA infection nor to guide or monitor treatment for MRSA infections.      Studies: Ct  Head Wo Contrast  07/01/2014   CLINICAL DATA:  Nausea and vomiting since yesterday, with large volume hematemesis this morning, initial evaluation  EXAM: CT HEAD WITHOUT CONTRAST  TECHNIQUE: Contiguous axial images were obtained from the base of the skull through the vertex without intravenous contrast.  COMPARISON:  04/21/2014  FINDINGS: Severe atrophy. Moderate low attenuation in the deep white matter. No evidence of acute infarct. No hemorrhage or extra-axial fluid. No evidence of mass or hydrocephalus. The calvarium is intact. There is an air-fluid level in the left sphenoid sinus. This was not present on the prior study.  IMPRESSION: Significant inflammatory change left sphenoid sinus suggesting acute sinusitis. No acute findings otherwise with significant chronic age-related involutional change.   Electronically Signed   By: Esperanza Heir M.D.   On: 07/16/2014 11:33   Nm Pulmonary Perfusion  07/15/2014   CLINICAL DATA:  Shortness of breath at nursing home today, history hypertension, atrial fibrillation, chronic systolic heart failure  EXAM: NUCLEAR MEDICINE PERFUSION SCAN  TECHNIQUE: Perfusion images were obtained in multiple projections after intravenous injection of radiopharmaceutical. Patient was unable to cooperate and perform of an adequate ventilation exam.  RADIOPHARMACEUTICALS:  6 mCi Tc12m MAA  COMPARISON:  None; correlation chest radiograph 07/15/2014 and CT abdomen/pelvis 07/16/2014  FINDINGS: Enlargement of cardiac silhouette.  Blunting of perfusion at the posterior RIGHT lower lobe secondary to pleural effusion.  No segmental or subsegmental perfusion defects identified.  IMPRESSION: Enlargement of cardiac silhouette and RIGHT pleural effusion.  No other segmental or subsegmental perfusion defects.  Findings represent a very low probability for pulmonary embolism.   Electronically Signed   By: Ulyses Southward M.D.   On: 07/22/2014 16:59   Ct Abdomen Pelvis W Contrast  07/26/2014    CLINICAL DATA:  Hematemesis  EXAM: CT ABDOMEN AND PELVIS WITH CONTRAST  TECHNIQUE: Multidetector CT imaging of the abdomen and pelvis was performed using the standard protocol following bolus administration of intravenous contrast.  CONTRAST:  25mL OMNIPAQUE IOHEXOL 300 MG/ML SOLN, OMNIPAQUE IOHEXOL 300 MG/ML SOLN  COMPARISON:  07/24/2012  FINDINGS: Lung bases demonstrate bilateral basilar atelectasis. Additionally there are changes highly suggestive of bilateral pulmonary emboli although timing was not performed for pulmonary embolism evaluation. Heavy coronary calcifications are seen.  The gallbladder is well distended and demonstrates multiple small gallstones. No pericholecystic fluid is seen. The liver, spleen, adrenal glands and pancreas are within normal limits. The kidneys are well visualized bilaterally and demonstrate renal cystic change without obstructive change.  Multiple dilated loops of small bowel are identified with a transition  point low in the left hemipelvis. No definitive mass lesion is noted in this region. The more distal small bowel in the ileum is within normal limits. The appendix is not well visualized although no inflammatory changes to suggest appendicitis are noted. Diffuse diverticular change is noted within the colon. No evidence of diverticulitis is seen. The bladder is partially distended. Prostatic calcifications are noted. No pelvic lymphadenopathy is seen. Diffuse aortoiliac calcifications are noted without aneurysmal dilatation. Chronic compression deformities at T12 and L1 are noted with changes of prior vertebral augmentation. Schmorl's node is noted in the superior endplate of L3.  IMPRESSION: Changes consistent with small bowel obstruction in the distal jejunum/proximal ileum. No definitive mass lesion is noted. These changes may be related to prior surgery with adhesions.  Changes highly suggestive of bilateral pulmonary emboli. CTA of the chest is recommended for  further evaluation.  Multiple gallstones without complicating factors.  Chronic renal cystic change.  Diverticulosis without diverticulitis.   Electronically Signed   By: Alcide CleverMark  Lukens M.D.   On: 07-18-14 11:42   Dg Abd Acute W/chest  07-18-14   CLINICAL DATA:  Hematemesis  EXAM: ACUTE ABDOMEN SERIES (ABDOMEN 2 VIEW & CHEST 1 VIEW)  COMPARISON:  03/13/2014  FINDINGS: There is chronic moderate cardiomegaly. Aortic and hilar contours are unchanged given lower lung volumes. There is mild atelectasis at the bases. No evidence for edema or pneumonia.  Moderate distention of the stomach by fluid and gas. No evidence of small bowel or colonic obstruction. No visible pneumoperitoneum.  IMPRESSION: 1. Moderate distension of the stomach by gas and fluid. 2. No pneumoperitoneum. 3. Shallow inspiration with bibasilar atelectasis.   Electronically Signed   By: Tiburcio PeaJonathan  Watts M.D.   On: 07-18-14 10:36    Scheduled Meds: . sodium chloride   Intravenous Once  . sodium chloride   Intravenous Once  . antiseptic oral rinse  7 mL Mouth Rinse BID  . metoprolol  5 mg Intravenous Q4H  . ondansetron (ZOFRAN) IV  4 mg Intravenous 4 times per day  . pantoprazole (PROTONIX) IV  40 mg Intravenous Q12H  . phytonadione (VITAMIN K) IV  3 mg Intravenous Once   Continuous Infusions: . dextrose 5 % and 0.9 % NaCl with KCl 20 mEq/L 75 mL/hr at 07/10/14 0447   Assessment and plan:  Principal Problem:   Upper GI bleed Active Problems:   Hematemesis   Warfarin-induced coagulopathy   SBO (small bowel obstruction)   Atrial fibrillation   AICD (automatic cardioverter/defibrillator) present   Chronic systolic heart failure   Nausea and vomiting   Multiple fractures of thoracic spine   Cholelithiasis   1. Upper GI bleed/hematemesis in the setting of anticoagulation. The patient does not endorse this, but rather says that he vomited previously eaten tomato soup. However in the setting of nausea and vomiting and  anticoagulation, hematemesis was more likely. We will continue IV Protonix and holding Coumadin. He received 1 unit of fresh frozen plasma and IV vitamin K overnight with no significant decrease in his INR. Will discuss further with gastroenterologist Dr. Karilyn Cotaehman regarding further reversal of patient's INR for EGD versus observation/monitoring. We'll keep patient nothing by mouth for now.  Mild acute blood loss anemia. His hemoglobin has decreased minimally in the setting of equilibration and hemodilution. We'll continue to monitor his CBC. No need for packed red blood cell transfusion currently.  Small bowel obstruction versus ileus. NG tube attempted in the ED, but the patient could not tolerate it. He  has had no further nausea or vomiting. He has minimal distention/tenderness on exam. We will continue IV Zofran scheduled and supportive treatment. Dr. Lovell SheehanJenkins aware of the patient and he was discussed with him yesterday. No need for surgical intervention at this time and if it were needed, Dr. Lovell SheehanJenkins would prefer transferring him to a tertiary care unit because of his comorbidities.  Chronic atrial fibrillation, on chronic Coumadin. His rate is controlled on scheduled IV metoprolol while he is nothing by mouth. He is anticoagulated with the attempt to reverse it if he has an EGD today.  Chronic systolic congestive heart failure; status post ICD; severe pulmonary hypertension; and severe biatrial dilatation, [facility]. He appears to be currently compensated. We will cautiously continue gentle IV fluids. He is treated with lisinopril and metoprolol chronically-both oral medications currently on hold. Continue IV metoprolol. Will give IV Lasix if it appears that he may be becoming volume overloaded.  Urinary retention, per nursing staff. Foley catheter inserted on 06/29/2014. Continue to monitor.   C6, T2, and T3 cervical fractures, status post fall in September  2015. Treated with a brace, [facility]. Currently stable.  History of subarachnoid hemorrhage, status post fall at home in September 2015. CT of the head in the ED revealed no acute findings.  Question of PE on CT scan of his abdomen and pelvis. VQ scan revealed low probability of PE in the setting of chronic anticoagulation.     Time spent: 35 minutes    [facility] Surgery Center LLCFISHER,Brandin Stetzer  [facility] Pager [phone]. If 7PM-7AM, please contact night-coverage at [email or web address], password [email or web address] 07/10/2014, 8:29 AM  LOS: 1 day

## 2014-07-11 ENCOUNTER — Encounter (HOSPITAL_COMMUNITY): Admission: EM | Disposition: E | Payer: Self-pay | Source: Home / Self Care | Attending: Internal Medicine

## 2014-07-11 ENCOUNTER — Encounter (HOSPITAL_COMMUNITY): Payer: Self-pay | Admitting: Internal Medicine

## 2014-07-11 ENCOUNTER — Inpatient Hospital Stay (HOSPITAL_COMMUNITY): Payer: Medicare Other

## 2014-07-11 DIAGNOSIS — K2981 Duodenitis with bleeding: Secondary | ICD-10-CM

## 2014-07-11 DIAGNOSIS — K449 Diaphragmatic hernia without obstruction or gangrene: Secondary | ICD-10-CM

## 2014-07-11 HISTORY — DX: Duodenitis with bleeding: K29.81

## 2014-07-11 HISTORY — PX: ESOPHAGOGASTRODUODENOSCOPY: SHX5428

## 2014-07-11 LAB — CBC
HEMATOCRIT: 34.7 % — AB (ref 39.0–52.0)
Hemoglobin: 11.3 g/dL — ABNORMAL LOW (ref 13.0–17.0)
MCH: 32.4 pg (ref 26.0–34.0)
MCHC: 32.6 g/dL (ref 30.0–36.0)
MCV: 99.4 fL (ref 78.0–100.0)
Platelets: 153 10*3/uL (ref 150–400)
RBC: 3.49 MIL/uL — ABNORMAL LOW (ref 4.22–5.81)
RDW: 16.8 % — AB (ref 11.5–15.5)
WBC: 5.1 10*3/uL (ref 4.0–10.5)

## 2014-07-11 LAB — BASIC METABOLIC PANEL
Anion gap: 8 (ref 5–15)
BUN: 17 mg/dL (ref 6–23)
CO2: 28 mEq/L (ref 19–32)
CREATININE: 1 mg/dL (ref 0.50–1.35)
Calcium: 9.2 mg/dL (ref 8.4–10.5)
Chloride: 108 mEq/L (ref 96–112)
GFR, EST AFRICAN AMERICAN: 75 mL/min — AB (ref >90)
GFR, EST NON AFRICAN AMERICAN: 64 mL/min — AB (ref >90)
Glucose, Bld: 97 mg/dL (ref 70–99)
Potassium: 4.5 mEq/L (ref 3.7–5.3)
Sodium: 144 mEq/L (ref 137–147)

## 2014-07-11 LAB — PREPARE FRESH FROZEN PLASMA: UNIT DIVISION: 0

## 2014-07-11 LAB — PROTIME-INR
INR: 1.54 — ABNORMAL HIGH (ref 0.00–1.49)
Prothrombin Time: 18.6 seconds — ABNORMAL HIGH (ref 11.6–15.2)

## 2014-07-11 SURGERY — EGD (ESOPHAGOGASTRODUODENOSCOPY)
Anesthesia: Moderate Sedation

## 2014-07-11 MED ORDER — FLEET ENEMA 7-19 GM/118ML RE ENEM
1.0000 | ENEMA | Freq: Once | RECTAL | Status: AC
  Administered 2014-07-11: 1 via RECTAL

## 2014-07-11 MED ORDER — STERILE WATER FOR IRRIGATION IR SOLN
Status: DC | PRN
  Administered 2014-07-11: 08:00:00

## 2014-07-11 MED ORDER — SODIUM CHLORIDE 0.9 % IV SOLN: INTRAVENOUS | Status: DC

## 2014-07-11 MED ORDER — MIDAZOLAM HCL 5 MG/5ML IJ SOLN
INTRAMUSCULAR | Status: AC
  Filled 2014-07-11: qty 10

## 2014-07-11 MED ORDER — MIDAZOLAM HCL 5 MG/5ML IJ SOLN
INTRAMUSCULAR | Status: DC | PRN
  Administered 2014-07-11 (×2): 1 mg via INTRAVENOUS

## 2014-07-11 MED ORDER — MEPERIDINE HCL 50 MG/ML IJ SOLN
INTRAMUSCULAR | Status: AC
  Filled 2014-07-11: qty 1

## 2014-07-11 MED ORDER — BUTAMBEN-TETRACAINE-BENZOCAINE 2-2-14 % EX AERO
INHALATION_SPRAY | CUTANEOUS | Status: DC | PRN
  Administered 2014-07-11: 1 via TOPICAL

## 2014-07-11 MED ORDER — SODIUM CHLORIDE 0.9 % IV SOLN
INTRAVENOUS | Status: DC | PRN
  Administered 2014-07-11: 300 mL via INTRAMUSCULAR

## 2014-07-11 NOTE — Plan of Care (Signed)
Problem: Phase I Progression Outcomes Goal: Initial discharge plan identified Outcome: Completed/Met Date Met:  07/23/2014     

## 2014-07-11 NOTE — Op Note (Signed)
EGD PROCEDURE REPORT  PATIENT:  Mark Day  MR#:  [id number] Birthdate:  09/07/1922, 80 y.o., male Endoscopist:  Dr. Malissa HippoNajeeb Day. Tonny Isensee, MD Referred By:  Dr. Elliot Cousinenise Fisher, MD Procedure Date: 07/20/2014  Procedure:   EGD  Indications:  Patient is 80 year old Caucasian male who presents with upper GI bleed and he also has small bowel obstruction. Patient's INR has corrected down to 1.87. He is undergoing diagnostic EGD.            Informed Consent:  The risks, benefits, alternatives & imponderables which include, but are not limited to, bleeding, infection, perforation, drug reaction and potential missed lesion have been reviewed.  The potential for biopsy, lesion removal, esophageal dilation, etc. have also been discussed.  Questions have been answered.  All parties agreeable.  Please see history & physical in medical record for more information.  Medications:  Versed 2 mg IV Cetacaine spray topically for oropharyngeal anesthesia  Description of procedure:  The endoscope was introduced through the mouth and advanced to the second portion of the duodenum without difficulty or limitations. The mucosal surfaces were surveyed very carefully during advancement of the scope and upon withdrawal.  Findings:  Hypopharynx: There was scant amount of blood coating hypopharyngeal mucosa but no bleeding lesion noted. Esophagus:  Mucosa of the esophagus was normal. GE junction was unremarkable. GEJ:  44 cm Hiatus:  46 cm Stomach:  There was small amount of bile in the stomach. Stomach distended very well with insufflation. Folds in the proximal stomach were normal. Examination of mucosa at gastric body, antrum, pyloric channel, angularis,fundus and cardia was normal. Duodenum:  Marked bulbar erythema and edema noted. Post bulbar mucosa was normal.  Therapeutic/Diagnostic Maneuvers Performed:  none  Complications:  none  Impression: Scant amount of fresh blood noted in hypopharynx without  bleeding lesion. This may be related to trauma from NG tube. Small sliding hiatal hernia. Bulbar duodenitis.  Recommendations:  Continue IV PPI. CBC and INR today. Stool antigen for H. Pylori. Fleet enema 1.  Mark Day  07/08/2014  8:59 AM  CC: Dr. Kirstie PeriSHAH,ASHISH, MD & Dr. Bonnetta BarryNo ref. provider found

## 2014-07-11 NOTE — Progress Notes (Signed)
[facility] PROGRESS NOTE  English Mark Day ZOX:[id number] DOB: Apr 09, 1923 DOA: Jul 15, 2014 PCP: Kirstie Peri, MD    Code Status: full code Family Communication: discussed with wife  Disposition Plan: discharge to the [facility] when clinically appropriate   Consultants:  Gastroenterologist, Dr. Karilyn Cota  General surgeon, Dr. Cherre Robins off  Procedures: 07/12/2014 EGD:  Impression: Scant amount of fresh blood noted in hypopharynx without bleeding lesion. This may be related to trauma from NG tube. Small sliding hiatal hernia; bulbar duodenitis.  Antibiotics:  none  HPI/Subjective: Patient is status post EGD this morning. He denies abdominal pain, nausea, and vomiting. He wants to know when he can eat.   Objective: Filed Vitals:   07/21/2014 0850  BP: 112/76  Pulse:   Temp:   Resp:    Temperature 96.9. Pulse rate 75. Respiratory rate 18. Blood pressure 112/76.    Intake/Output Summary (Last 24 hours) at 07/21/2014 1013 Last data filed at 07/08/2014 0906  Gross per 24 hour  Intake   3668 ml  Output    650 ml  Net   3018 ml   Filed Weights   Jul 15, 2014 1538 07/10/14 0500 07/09/2014 0500  Weight: 69.7 kg (153 lb 10.6 oz) 73.437 kg (161 lb 14.4 oz) 74.5 kg (164 lb 3.9 oz)    Exam:   General:  Chronically ill-appearing 80 year old man in no acute distress.  Neck: Large neck brace on extending to mid chest.  Cardiovascular: irregular, irregular  Respiratory: exam difficult with brace on, but clear anteriorly and decreased breath sounds in the bases.  Abdomen:hypoactive bowel sounds, soft, minimally distended, and minimal hypogastric tenderness; no masses palpated.  Musculoskeletal: no acute hot red joints; pedal pulses palpable; no pedal edema.  Neurologic: He  is alert and has a strong voice. He is oriented to himself, place, wife, and friend.  Data Reviewed: Basic Metabolic Panel:  Recent Labs Lab July 15, 2014 0939 15-Jul-2014 0949 July 15, 2014 1443  07-15-2014 2002 07/10/14 0219 07/22/2014 0542  NA 140 138  --  139 140 144  K 3.6* 3.5*  --  3.7 3.8 4.5  CL 95* 98  --  102 103 108  CO2 29  --   --  27 27 28   GLUCOSE 119* 121*  --  115* 109* 97  BUN 29* 34*  --  27* 24* 17  CREATININE 1.06 1.00  --  1.05 1.02 1.00  CALCIUM 10.5  --   --  9.2 9.1 9.2  MG  --   --  1.8  --   --   --    Liver Function Tests:  Recent Labs Lab 07/15/2014 0939 07/10/14 0219  AST 23 21  ALT 14 14  ALKPHOS 139* 112  BILITOT 1.7* 1.1  PROT 7.8 6.2  ALBUMIN 3.8 3.0*    Recent Labs Lab July 15, 2014 0939  LIPASE 52    Recent Labs Lab 07-15-14 1355  AMMONIA 23   CBC:  Recent Labs Lab 07-15-14 0939  2014-07-15 1442 2014/07/15 2002 07/10/14 0219 07/10/14 0818 07/07/2014 0919  WBC 9.2  --  8.3 7.1 6.3 6.7 5.1  NEUTROABS 6.8  --   --   --   --   --   --   HGB 13.8  < > 12.3* 11.8* 11.4* 12.1* 11.3*  HCT 41.0  < > 36.1* 35.4* 34.1* 36.5* 34.7*  MCV 95.6  --  95.8 96.2 96.9 98.1 99.4  PLT 228  --  192 176 168 170 153  < > = values in this interval  not displayed. Cardiac Enzymes:  Recent Labs Lab 07/07/2014 0939  TROPONINI <0.30   BNP (last 3 results)  Recent Labs  07/15/2014 0939  PROBNP 3979.0*   CBG:  Recent Labs Lab 07/20/2014 1157 07/08/2014 1336 07/17/2014 1824  GLUCAP 105* 101* 114*    Recent Results (from the past 240 hour(s))  MRSA PCR Screening     Status: None   Collection Time: 07/15/2014  3:15 PM  Result Value Ref Range Status   MRSA by PCR NEGATIVE NEGATIVE Final    Comment:        The GeneXpert MRSA Assay (FDA approved for NASAL specimens only), is one component of a comprehensive MRSA colonization surveillance program. It is not intended to diagnose MRSA infection nor to guide or monitor treatment for MRSA infections.      Studies: Ct Head Wo Contrast  07/10/2014   CLINICAL DATA:  Nausea and vomiting since yesterday, with large volume hematemesis this morning, initial evaluation  EXAM: CT HEAD WITHOUT CONTRAST   TECHNIQUE: Contiguous axial images were obtained from the base of the skull through the vertex without intravenous contrast.  COMPARISON:  04/21/2014  FINDINGS: Severe atrophy. Moderate low attenuation in the deep white matter. No evidence of acute infarct. No hemorrhage or extra-axial fluid. No evidence of mass or hydrocephalus. The calvarium is intact. There is an air-fluid level in the left sphenoid sinus. This was not present on the prior study.  IMPRESSION: Significant inflammatory change left sphenoid sinus suggesting acute sinusitis. No acute findings otherwise with significant chronic age-related involutional change.   Electronically Signed   By: Esperanza Heiraymond  Rubner M.D.   On: 07/21/2014 11:33   Nm Pulmonary Perfusion  06/28/2014   CLINICAL DATA:  Shortness of breath at nursing home today, history hypertension, atrial fibrillation, chronic systolic heart failure  EXAM: NUCLEAR MEDICINE PERFUSION SCAN  TECHNIQUE: Perfusion images were obtained in multiple projections after intravenous injection of radiopharmaceutical. Patient was unable to cooperate and perform of an adequate ventilation exam.  RADIOPHARMACEUTICALS:  6 mCi [id number] MAA  COMPARISON:  None; correlation chest radiograph 07/19/2014 and CT abdomen/pelvis 07/07/2014  FINDINGS: Enlargement of cardiac silhouette.  Blunting of perfusion at the posterior RIGHT lower lobe secondary to pleural effusion.  No segmental or subsegmental perfusion defects identified.  IMPRESSION: Enlargement of cardiac silhouette and RIGHT pleural effusion.  No other segmental or subsegmental perfusion defects.  Findings represent a very low probability for pulmonary embolism.   Electronically Signed   By: Ulyses SouthwardMark  Boles M.D.   On: 07/27/2014 16:59   Ct Abdomen Pelvis W Contrast  07/12/2014   CLINICAL DATA:  Hematemesis  EXAM: CT ABDOMEN AND PELVIS WITH CONTRAST  TECHNIQUE: Multidetector CT imaging of the abdomen and pelvis was performed using the standard protocol following  bolus administration of intravenous contrast.  CONTRAST:  25mL OMNIPAQUE IOHEXOL 300 MG/ML SOLN, 100mL OMNIPAQUE IOHEXOL 300 MG/ML SOLN  COMPARISON:  07/24/2012  FINDINGS: Lung bases demonstrate bilateral basilar atelectasis. Additionally there are changes highly suggestive of bilateral pulmonary emboli although timing was not performed for pulmonary embolism evaluation. Heavy coronary calcifications are seen.  The gallbladder is well distended and demonstrates multiple small gallstones. No pericholecystic fluid is seen. The liver, spleen, adrenal glands and pancreas are within normal limits. The kidneys are well visualized bilaterally and demonstrate renal cystic change without obstructive change.  Multiple dilated loops of small bowel are identified with a transition point low in the left hemipelvis. No definitive mass lesion is noted in this region. The more  distal small bowel in the ileum is within normal limits. The appendix is not well visualized although no inflammatory changes to suggest appendicitis are noted. Diffuse diverticular change is noted within the colon. No evidence of diverticulitis is seen. The bladder is partially distended. Prostatic calcifications are noted. No pelvic lymphadenopathy is seen. Diffuse aortoiliac calcifications are noted without aneurysmal dilatation. Chronic compression deformities at T12 and L1 are noted with changes of prior vertebral augmentation. Schmorl's node is noted in the superior endplate of L3.  IMPRESSION: Changes consistent with small bowel obstruction in the distal jejunum/proximal ileum. No definitive mass lesion is noted. These changes may be related to prior surgery with adhesions.  Changes highly suggestive of bilateral pulmonary emboli. CTA of the chest is recommended for further evaluation.  Multiple gallstones without complicating factors.  Chronic renal cystic change.  Diverticulosis without diverticulitis.   Electronically Signed   By: Alcide CleverMark  Lukens M.D.    On: 07/06/2014 11:42   Dg Abd Acute W/chest  07/05/2014   CLINICAL DATA:  Nausea and vomiting  EXAM: ACUTE ABDOMEN SERIES (ABDOMEN 2 VIEW & CHEST 1 VIEW)  COMPARISON:  06/29/2014  FINDINGS: Dilated small bowel is again identified but appears improved when compared with the prior exam. No free air is noted. No abnormal mass is noted. Vertebral augmentation is noted.  The cardiac shadow is not remains enlarged. A defibrillator is again seen. Patchy bibasilar atelectasis is noted. No infiltrate or pneumothorax is seen. Aortic calcifications are again noted.  IMPRESSION: Improving small bowel obstructive change.  Bibasilar atelectatic changes are seen.   Electronically Signed   By: Alcide CleverMark  Lukens M.D.   On: 07/21/2014 07:33   Dg Abd Acute W/chest  07/04/2014   CLINICAL DATA:  Hematemesis  EXAM: ACUTE ABDOMEN SERIES (ABDOMEN 2 VIEW & CHEST 1 VIEW)  COMPARISON:  03/13/2014  FINDINGS: There is chronic moderate cardiomegaly. Aortic and hilar contours are unchanged given lower lung volumes. There is mild atelectasis at the bases. No evidence for edema or pneumonia.  Moderate distention of the stomach by fluid and gas. No evidence of small bowel or colonic obstruction. No visible pneumoperitoneum.  IMPRESSION: 1. Moderate distension of the stomach by gas and fluid. 2. No pneumoperitoneum. 3. Shallow inspiration with bibasilar atelectasis.   Electronically Signed   By: Tiburcio PeaJonathan  Watts M.D.   On: 07/06/2014 10:36    Scheduled Meds: . sodium chloride   Intravenous Once  . antiseptic oral rinse  7 mL Mouth Rinse BID  . meperidine      . metoprolol  5 mg Intravenous Q4H  . midazolam      . ondansetron (ZOFRAN) IV  4 mg Intravenous 4 times per day  . pantoprazole (PROTONIX) IV  40 mg Intravenous Q12H  . sodium phosphate  1 enema Rectal Once   Continuous Infusions: . dextrose 5 % and 0.9 % NaCl with KCl 20 mEq/L 75 mL/hr at 07/01/2014 0906   Assessment and plan:  Principal Problem:   Duodenitis with  bleeding Active Problems:   Hematemesis   Warfarin-induced coagulopathy   SBO (small bowel obstruction)   Atrial fibrillation   AICD (automatic cardioverter/defibrillator) present   Chronic systolic heart failure   Nausea and vomiting   Multiple fractures of thoracic spine   Cholelithiasis   1. Upper GI bleed/hematemesis secondary to duodenitis in the setting of anticoagulation. The patient was evaluated by Dr. Karilyn Cotaehman with an EGD this morning. It revealed duodenitis which was likely the cause of his hematemesis. The small  amount of blood seen around the hypopharynx was likely secondary to NG tube trauma. Will consider starting clear liquids today if he has a bowel movement. Continue IV Protonix.  Mild acute blood loss anemia. His hemoglobin has decreased minimally in the setting of equilibration and hemodilution. We'll continue to monitor his CBC. No need for packed red blood cell transfusion currently.  Small bowel obstruction versus ileus. NG tube attempted in the ED, but the patient could not tolerate it. Per Dr. Dionicia Abler, there was some mild decompression during the EGD. Fleets enema ordered. He has a bowel movement, will consider clear liquids today. He has had no further nausea or vomiting. He has minimal distention/tenderness on exam. We will continue IV Zofran scheduled and supportive treatment. Dr. Lovell Sheehan was consulted earlier, but felt that the patient was not a surgical candidate in this hospital once he reviewed the patient's chart. Currently there does not appear to be a surgical need..  Chronic atrial fibrillation, on chronic Coumadin. His rate is controlled on scheduled IV metoprolol while he is nothing by mouth. He is anticoagulated Coumadin, status post partial reversal for the EGD. Will restart Coumadin per pharmacy tomorrow.  Chronic systolic congestive heart failure; status post ICD; severe pulmonary hypertension; and severe biatrial dilatation, per review of echo at  [facility] September 2015. He appears to be currently compensated. We will cautiously continue gentle IV fluids. He is treated with lisinopril and metoprolol chronically-both oral medications currently on hold. Continue IV metoprolol. Will give IV Lasix if it appears that he may be becoming volume overloaded.  Urinary retention, per nursing staff. Foley catheter inserted on 07/18/2014. Continue to monitor.  C6, T2, and T3 cervical fractures, status post fall in September 2015. Treated with a brace, currently in place at [facility]. Currently stable.  History of subarachnoid hemorrhage, status post fall at home in September 2015. CT of the head in the ED revealed no acute findings.  Question of PE on CT scan of his abdomen and pelvis. VQ scan revealed low probability of PE in the setting of chronic anticoagulation.     Time spent: 30 minutes    [facility]  [facility] Pager [phone]. If 7PM-7AM, please contact night-coverage at [email or web address], password [facility] 07/27/2014, 10:13 AM  LOS: 2 days

## 2014-07-12 ENCOUNTER — Inpatient Hospital Stay (HOSPITAL_COMMUNITY): Payer: Medicare Other

## 2014-07-12 LAB — CBC
HCT: 38.2 % — ABNORMAL LOW (ref 39.0–52.0)
Hemoglobin: 12.6 g/dL — ABNORMAL LOW (ref 13.0–17.0)
MCH: 32.7 pg (ref 26.0–34.0)
MCHC: 33 g/dL (ref 30.0–36.0)
MCV: 99.2 fL (ref 78.0–100.0)
PLATELETS: 185 10*3/uL (ref 150–400)
RBC: 3.85 MIL/uL — ABNORMAL LOW (ref 4.22–5.81)
RDW: 16.6 % — ABNORMAL HIGH (ref 11.5–15.5)
WBC: 6.6 10*3/uL (ref 4.0–10.5)

## 2014-07-12 LAB — URINALYSIS, ROUTINE W REFLEX MICROSCOPIC
BILIRUBIN URINE: NEGATIVE
Glucose, UA: NEGATIVE mg/dL
Ketones, ur: NEGATIVE mg/dL
Nitrite: NEGATIVE
PROTEIN: NEGATIVE mg/dL
Specific Gravity, Urine: 1.02 (ref 1.005–1.030)
UROBILINOGEN UA: 0.2 mg/dL (ref 0.0–1.0)
pH: 5.5 (ref 5.0–8.0)

## 2014-07-12 LAB — BASIC METABOLIC PANEL
ANION GAP: 9 (ref 5–15)
Anion gap: 10 (ref 5–15)
BUN: 12 mg/dL (ref 6–23)
BUN: 12 mg/dL (ref 6–23)
CALCIUM: 9.4 mg/dL (ref 8.4–10.5)
CALCIUM: 9.3 mg/dL (ref 8.4–10.5)
CO2: 29 meq/L (ref 19–32)
CO2: 28 mEq/L (ref 19–32)
Chloride: 106 mEq/L (ref 96–112)
Chloride: 106 mEq/L (ref 96–112)
Creatinine, Ser: 0.93 mg/dL (ref 0.50–1.35)
Creatinine, Ser: 0.89 mg/dL (ref 0.50–1.35)
GFR calc Af Amer: 84 mL/min — ABNORMAL LOW (ref >90)
GFR calc Af Amer: 85 mL/min — ABNORMAL LOW (ref >90)
GFR, EST NON AFRICAN AMERICAN: 72 mL/min — AB (ref >90)
GFR, EST NON AFRICAN AMERICAN: 74 mL/min — AB (ref >90)
GLUCOSE: 137 mg/dL — AB (ref 70–99)
Glucose, Bld: 109 mg/dL — ABNORMAL HIGH (ref 70–99)
POTASSIUM: 4.1 meq/L (ref 3.7–5.3)
Potassium: 4.1 mEq/L (ref 3.7–5.3)
SODIUM: 144 meq/L (ref 137–147)
Sodium: 144 mEq/L (ref 137–147)

## 2014-07-12 LAB — URINE MICROSCOPIC-ADD ON

## 2014-07-12 LAB — PROTIME-INR
INR: 1.34 (ref 0.00–1.49)
PROTHROMBIN TIME: 16.7 s — AB (ref 11.6–15.2)

## 2014-07-12 MED ORDER — WARFARIN - PHYSICIAN DOSING INPATIENT: Freq: Every day | Status: DC

## 2014-07-12 MED ORDER — LISINOPRIL 10 MG PO TABS
10.0000 mg | ORAL_TABLET | Freq: Every day | ORAL | Status: DC
  Administered 2014-07-12 – 2014-07-15 (×4): 10 mg via ORAL
  Filled 2014-07-12 (×4): qty 1
  Filled 2014-07-12: qty 2

## 2014-07-12 MED ORDER — WARFARIN SODIUM 5 MG PO TABS
5.0000 mg | ORAL_TABLET | ORAL | Status: AC
  Administered 2014-07-12: 5 mg via ORAL
  Filled 2014-07-12: qty 1

## 2014-07-12 MED ORDER — FUROSEMIDE 10 MG/ML IJ SOLN
10.0000 mg | Freq: Once | INTRAMUSCULAR | Status: AC
  Administered 2014-07-12: 10 mg via INTRAVENOUS
  Filled 2014-07-12: qty 2

## 2014-07-12 MED ORDER — METOPROLOL TARTRATE 50 MG PO TABS
50.0000 mg | ORAL_TABLET | Freq: Two times a day (BID) | ORAL | Status: DC
  Administered 2014-07-12: 50 mg via ORAL
  Filled 2014-07-12: qty 1

## 2014-07-12 MED ORDER — ACETAMINOPHEN 325 MG PO TABS
650.0000 mg | ORAL_TABLET | Freq: Four times a day (QID) | ORAL | Status: DC | PRN
  Administered 2014-07-12: 650 mg via ORAL
  Filled 2014-07-12: qty 2

## 2014-07-12 MED ORDER — ALPRAZOLAM 0.25 MG PO TABS: 0.1250 mg | ORAL_TABLET | Freq: Three times a day (TID) | ORAL | Status: DC | PRN

## 2014-07-12 MED ORDER — WARFARIN - PHARMACIST DOSING INPATIENT: Status: DC

## 2014-07-12 NOTE — Progress Notes (Addendum)
[facility] PROGRESS NOTE  Georgette Doverhomas Ruggirello HYQ:[id number] DOB: 11/03/1922 DOA: 01-Jan-2014 PCP: Kirstie PeriSHAH,ASHISH, MD    Code Status: full code Family Communication: discussed with wife on 11/14. Disposition Plan: discharge to the [facility] when clinically appropriate, likely in the next 24-48 hours.   Consultants:  Gastroenterologist, Dr. Karilyn Cotaehman  General surgeon, Dr. Cherre RobinsJenkins-Signed off  Procedures: 07/26/2014 EGD:  Impression: Scant amount of fresh blood noted in hypopharynx without bleeding lesion. This may be related to trauma from NG tube. Small sliding hiatal hernia; bulbar duodenitis.  Antibiotics:  none  HPI/Subjective: Nursing reported the patient had a moderate bowel movement this morning. He received a fleets enema yesterday. He has no complaints of nausea, vomiting, or abdominal pain. He wants to eat.   Objective: Filed Vitals:   07/12/14 0824  BP:   Pulse:   Temp: 97.7 F (36.5 C)  Resp:    Temperature 97.7. Pulse 97. Respiratory rate 22. Blood pressure 162/101. Oxygen saturation 99% on supplemental oxygen.    Intake/Output Summary (Last 24 hours) at 07/12/14 1155 Last data filed at 07/12/14 0825  Gross per 24 hour  Intake 1951.25 ml  Output   1050 ml  Net 901.25 ml   Filed Weights   07/10/14 0500 06/30/2014 0500 07/12/14 0500  Weight: 73.437 kg (161 lb 14.4 oz) 74.5 kg (164 lb 3.9 oz) 76.3 kg (168 lb 3.4 oz)    Exam:   General:  Chronically ill-appearing 80 year old man in no acute distress. He is much more alert.  Neck: Large neck brace on extending to mid chest.  Cardiovascular: irregular, irregular  Respiratory: exam difficult with brace on, but clear anteriorly and decreased breath sounds in the bases.  Abdomen:hypoactive bowel sounds, soft, minimally distended, no tenderness; no masses palpated.  Musculoskeletal: no acute hot red joints; pedal pulses palpable; no pedal edema.  Neurologic: He  is alert and has a strong voice. He  is oriented to himself, place, wife, and friend.  Data Reviewed: Basic Metabolic Panel:  Recent Labs Lab 08/30/13 0939 08/30/13 0949 08/30/13 1443 08/30/13 2002 07/10/14 0219 07/23/2014 0542 07/12/14 0714  NA 140 138  --  139 140 144 144  K 3.6* 3.5*  --  3.7 3.8 4.5 4.1  CL 95* 98  --  102 103 108 106  CO2 29  --   --  27 27 28 29   GLUCOSE 119* 121*  --  115* 109* 97 109*  BUN 29* 34*  --  27* 24* 17 12  CREATININE 1.06 1.00  --  1.05 1.02 1.00 0.93  CALCIUM 10.5  --   --  9.2 9.1 9.2 9.4  MG  --   --  1.8  --   --   --   --    Liver Function Tests:  Recent Labs Lab 08/30/13 0939 07/10/14 0219  AST 23 21  ALT 14 14  ALKPHOS 139* 112  BILITOT 1.7* 1.1  PROT 7.8 6.2  ALBUMIN 3.8 3.0*    Recent Labs Lab 08/30/13 0939  LIPASE 52    Recent Labs Lab 08/30/13 1355  AMMONIA 23   CBC:  Recent Labs Lab 08/30/13 0939  08/30/13 2002 07/10/14 0219 07/10/14 0818 07/07/2014 0919 07/12/14 0714  WBC 9.2  < > 7.1 6.3 6.7 5.1 6.6  NEUTROABS 6.8  --   --   --   --   --   --   HGB 13.8  < > 11.8* 11.4* 12.1* 11.3* 12.6*  HCT 41.0  < >  35.4* 34.1* 36.5* 34.7* 38.2*  MCV 95.6  < > 96.2 96.9 98.1 99.4 99.2  PLT 228  < > 176 168 170 153 185  < > = values in this interval not displayed. Cardiac Enzymes:  Recent Labs Lab 06/29/2014 0939  TROPONINI <0.30   BNP (last 3 results)  Recent Labs  07/06/2014 0939  PROBNP 3979.0*   CBG:  Recent Labs Lab 07/16/2014 1157 07/03/2014 1336 07/07/2014 1824  GLUCAP 105* 101* 114*    Recent Results (from the past 240 hour(s))  MRSA PCR Screening     Status: None   Collection Time: 07/27/2014  3:15 PM  Result Value Ref Range Status   MRSA by PCR NEGATIVE NEGATIVE Final    Comment:        The GeneXpert MRSA Assay (FDA approved for NASAL specimens only), is one component of a comprehensive MRSA colonization surveillance program. It is not intended to diagnose MRSA infection nor to guide or monitor treatment for MRSA  infections.      Studies: Dg Abd Acute W/chest  07/10/2014   CLINICAL DATA:  Nausea and vomiting  EXAM: ACUTE ABDOMEN SERIES (ABDOMEN 2 VIEW & CHEST 1 VIEW)  COMPARISON:  06/29/2014  FINDINGS: Dilated small bowel is again identified but appears improved when compared with the prior exam. No free air is noted. No abnormal mass is noted. Vertebral augmentation is noted.  The cardiac shadow is not remains enlarged. A defibrillator is again seen. Patchy bibasilar atelectasis is noted. No infiltrate or pneumothorax is seen. Aortic calcifications are again noted.  IMPRESSION: Improving small bowel obstructive change.  Bibasilar atelectatic changes are seen.   Electronically Signed   By: Alcide Clever M.D.   On: 06/30/2014 07:33    Scheduled Meds: . sodium chloride   Intravenous Once  . antiseptic oral rinse  7 mL Mouth Rinse BID  . metoprolol  5 mg Intravenous Q4H  . ondansetron (ZOFRAN) IV  4 mg Intravenous 4 times per day  . pantoprazole (PROTONIX) IV  40 mg Intravenous Q12H  . [START ON 07/13/2014] Warfarin - Physician Dosing Inpatient   Does not apply q1800   Continuous Infusions: . dextrose 5 % and 0.9 % NaCl with KCl 20 mEq/L 70 mL/hr at 07/05/2014 1950   Assessment and plan:  Principal Problem:   Duodenitis with bleeding Active Problems:   Hematemesis   Warfarin-induced coagulopathy   SBO (small bowel obstruction)   Atrial fibrillation   AICD (automatic cardioverter/defibrillator) present   Chronic systolic heart failure   Nausea and vomiting   Multiple fractures of thoracic spine   Cholelithiasis   1. Upper GI bleed/hematemesis secondary to duodenitis in the setting of anticoagulation. The patient was evaluated by Dr. Karilyn Cota with an EGD on 07/12/2014. It revealed duodenitis which was likely the cause of his hematemesis. The small amount of blood seen around the hypopharynx was likely secondary to NG tube trauma. He has had no further nausea, vomiting, or hematemesis. Will start  clear liquids today if he has a bowel movement. Continue IV Protonix.  Mild acute blood loss anemia. His hemoglobin has decreased minimally in the setting of equilibration and hemodilution. We'll continue to monitor his CBC. No need for packed red blood cell transfusion currently.  Small bowel obstruction versus ileus. NG tube attempted in the ED, but the patient could not tolerate it. Per Dr. Karilyn Cota, he did mild decompression during the EGD. Fleets enema ordered. He had a bowel movement early this morning. KUB yesterday revealed  decrease in bowel obstruction. We'll start a clear liquid diet. Will change Zofran to as needed. Dr. Lovell SheehanJenkins was consulted earlier, but felt that the patient was not a surgical candidate in this hospital. Currently there does not appear to be a surgical need..  Chronic atrial fibrillation, on chronic Coumadin. His rate is controlled on scheduled IV metoprolol while he is nothing by mouth. He is anticoagulated Coumadin, status post partial reversal for the EGD. We'll discontinue IV metoprolol and start oral metoprolol. Will restart Coumadin per pharmacy. He was given one 5 mg dose of Coumadin as morning. Will check daily INRs.  Chronic systolic congestive heart failure; status post ICD; severe pulmonary hypertension; and severe biatrial dilatation, [facility]. He appears to be currently compensated. He is being hydrated gently. We'll decrease the IV fluids. Follow-up chest x-ray reveals no pulmonary edema.   Hypertension. His blood pressure is trending up. Will restart lisinopril and oral Toprol all. We'll give 1 dose of Lasix 10 mg IV 1 which may help with hypertension.  Urinary retention, per nursing staff. Foley catheter inserted on 07/16/2014. We'll order urinalysis. Continue to monitor. We'll refer him to urology as an outpatient.  C6, T2, and T3 cervical fractures, status post fall in September 2015. Treated with a brace, [facility]. Currently stable.  History of subarachnoid hemorrhage, status post fall at home in September 2015. CT of the head in the ED revealed no acute findings.  Question of PE on CT scan of his abdomen and pelvis. VQ scan revealed low probability of PE in the setting of chronic anticoagulation.     Time spent: 25 minutes    [facility] Surgery CenterFISHER,Shanty Ginty  [facility] Pager [phone]. If 7PM-7AM, please contact night-coverage at [email or web address], password [email or web address] 07/12/2014, 11:55 AM  LOS: 3 days

## 2014-07-12 NOTE — Progress Notes (Signed)
ANTICOAGULATION CONSULT NOTE - Initial Consult  Pharmacy Consult for Coumadin Indication: atrial fibrillation  No Known Allergies  Patient Measurements: Height: 6\' 1"  (185.4 cm) Weight: 168 lb 3.4 oz (76.3 kg) IBW/kg (Calculated) : 79.9  Vital Signs: Temp: 97.1 F (36.2 C) (11/15 1208) Temp Source: Oral (11/15 1208) BP: 162/101 mmHg (11/15 0800) Pulse Rate: 97 (11/15 0800)  Labs:  Recent Labs  07/10/14 0219 07/10/14 0818 07/10/14 0903 07/10/14 1912 07/10/2014 0542 06/30/2014 0919 07/12/14 0714  HGB 11.4* 12.1*  --   --   --  11.3* 12.6*  HCT 34.1* 36.5*  --   --   --  34.7* 38.2*  PLT 168 170  --   --   --  153 185  APTT 43*  --   --   --   --   --   --   LABPROT 31.8*  --  29.8* 21.7*  --  18.6*  --   INR 3.05*  --  2.81* 1.87*  --  1.54*  --   CREATININE 1.02  --   --   --  1.00  --  0.93    Estimated Creatinine Clearance: 58.1 mL/min (by C-G formula based on Cr of 0.93).   Medical History: Past Medical History  Diagnosis Date  . Osteoarthritis   . Chronic diarrhea   . Hypertension   . Fracture of vertebra 10/2013    Lumbar  . Epidural hematoma 04/2014     [facility]  . Chronic systolic heart failure 04/2014    at [facility]-ejection fraction 45-50%; severe ulnar hypertension  . AICD (automatic cardioverter/defibrillator) present   . Atrial fibrillation     On coumadin  . Multiple fractures of thoracic spine 04/2014    [facility]  . Cervical spine fracture 04/2014    [facility]  . Cholelithiasis 07/27/2014  . Tremor of both hands   . Chronic anticoagulation     With warfarin  . Pulmonary hypertension 04/2014    [facility]  . Atrial dilatation, bilateral 04/2014    [facility]  . Duodenitis with bleeding 07/23/2014    Medications:  Prescriptions prior to admission  Medication Sig Dispense Refill Last Dose  . ALPRAZolam (XANAX) 0.25 MG tablet Take 0.125 mg by mouth every 6  (six) hours as needed for anxiety.    07/08/2014 at Unknown time  . amoxicillin-clavulanate (AUGMENTIN) 875-125 MG per tablet Take 1 tablet by mouth 2 (two) times daily. Starting 07/06/2014 and ending 07/10/2014.   07/08/2014 at Unknown time  . aspirin 81 MG tablet Take 81 mg by mouth daily.   07/08/2014 at Unknown time  . atorvastatin (LIPITOR) 10 MG tablet Take 10 mg by mouth daily.   07/08/2014 at Unknown time  . escitalopram (LEXAPRO) 10 MG tablet Take 10 mg by mouth daily.   07/08/2014 at Unknown time  . lisinopril (PRINIVIL,ZESTRIL) 10 MG tablet Take 10 mg by mouth daily.   07/08/2014 at Unknown time  . metoprolol (LOPRESSOR) 50 MG tablet Take 50 mg by mouth 2 (two) times daily.   07/08/2014 at 2000  . warfarin (COUMADIN) 6 MG tablet Take 6 mg by mouth daily.   07/08/2014 at Unknown time  . docusate sodium (COLACE) 100 MG capsule Take 200 mg by mouth daily.   Taking  . warfarin (COUMADIN) 4 MG tablet Take 4 mg by mouth every other day. Alternate between 4mg  and 5mg   Taking  . warfarin (COUMADIN) 5 MG tablet Take 5 mg by mouth every other day. Alternate between 4mg  and 5mg    Taking    Assessment: [age] yoM on chronic Coumadin for Afib.  INR therapeutic at upper end of goal range on admission.   He was admitted with UGIB and Coumadin was reversed with total 5mg  Vit K + 1 unit FFP.  S/P EGD on 11/14.  Asked to resume Coumadin.  He was given 5mg  Coumadin already this morning per MD.   Goal of Therapy:  INR 2-3   Plan:  Daily INR No additional warfarin doses today  Calib Wadhwa, Mercy RidingAndrea Michelle 07/12/2014,12:35 PM

## 2014-07-13 ENCOUNTER — Inpatient Hospital Stay (HOSPITAL_COMMUNITY): Payer: Medicare Other

## 2014-07-13 ENCOUNTER — Encounter (HOSPITAL_COMMUNITY): Payer: Self-pay | Admitting: Internal Medicine

## 2014-07-13 DIAGNOSIS — I5023 Acute on chronic systolic (congestive) heart failure: Secondary | ICD-10-CM

## 2014-07-13 LAB — BASIC METABOLIC PANEL
ANION GAP: 10 (ref 5–15)
BUN: 8 mg/dL (ref 6–23)
CHLORIDE: 99 meq/L (ref 96–112)
CO2: 29 mEq/L (ref 19–32)
Calcium: 9.1 mg/dL (ref 8.4–10.5)
Creatinine, Ser: 0.9 mg/dL (ref 0.50–1.35)
GFR calc non Af Amer: 73 mL/min — ABNORMAL LOW (ref >90)
GFR, EST AFRICAN AMERICAN: 85 mL/min — AB (ref >90)
Glucose, Bld: 128 mg/dL — ABNORMAL HIGH (ref 70–99)
Potassium: 3.7 mEq/L (ref 3.7–5.3)
Sodium: 138 mEq/L (ref 137–147)

## 2014-07-13 LAB — CBC
HCT: 36 % — ABNORMAL LOW (ref 39.0–52.0)
HEMATOCRIT: 37.9 % — AB (ref 39.0–52.0)
HEMOGLOBIN: 12.6 g/dL — AB (ref 13.0–17.0)
Hemoglobin: 12 g/dL — ABNORMAL LOW (ref 13.0–17.0)
MCH: 32.6 pg (ref 26.0–34.0)
MCH: 32.2 pg (ref 26.0–34.0)
MCHC: 33.2 g/dL (ref 30.0–36.0)
MCHC: 33.3 g/dL (ref 30.0–36.0)
MCV: 97.9 fL (ref 78.0–100.0)
MCV: 96.5 fL (ref 78.0–100.0)
PLATELETS: 200 10*3/uL (ref 150–400)
Platelets: 204 10*3/uL (ref 150–400)
RBC: 3.87 MIL/uL — ABNORMAL LOW (ref 4.22–5.81)
RBC: 3.73 MIL/uL — ABNORMAL LOW (ref 4.22–5.81)
RDW: 16.5 % — AB (ref 11.5–15.5)
RDW: 16.3 % — AB (ref 11.5–15.5)
WBC: 7.8 10*3/uL (ref 4.0–10.5)
WBC: 7.4 10*3/uL (ref 4.0–10.5)

## 2014-07-13 LAB — PROTIME-INR
INR: 1.38 (ref 0.00–1.49)
Prothrombin Time: 17.1 seconds — ABNORMAL HIGH (ref 11.6–15.2)

## 2014-07-13 MED ORDER — KCL IN DEXTROSE-NACL 40-5-0.45 MEQ/L-%-% IV SOLN
INTRAVENOUS | Status: DC
  Administered 2014-07-13: 22:00:00 via INTRAVENOUS
  Administered 2014-07-13: 1000 mL via INTRAVENOUS
  Administered 2014-07-14 – 2014-07-15 (×5): via INTRAVENOUS
  Filled 2014-07-13 (×3): qty 1000

## 2014-07-13 MED ORDER — LORAZEPAM 2 MG/ML IJ SOLN: 0.2500 mg | Freq: Four times a day (QID) | INTRAMUSCULAR | Status: DC | PRN

## 2014-07-13 MED ORDER — ENOXAPARIN SODIUM 40 MG/0.4ML ~~LOC~~ SOLN: 40.0000 mg | SUBCUTANEOUS | Status: DC

## 2014-07-13 MED ORDER — FUROSEMIDE 10 MG/ML IJ SOLN
20.0000 mg | Freq: Every day | INTRAMUSCULAR | Status: DC
  Administered 2014-07-13 – 2014-07-16 (×4): 20 mg via INTRAVENOUS
  Filled 2014-07-13 (×4): qty 2

## 2014-07-13 MED ORDER — ENOXAPARIN SODIUM 60 MG/0.6ML ~~LOC~~ SOLN
60.0000 mg | Freq: Two times a day (BID) | SUBCUTANEOUS | Status: DC
  Administered 2014-07-13 – 2014-07-19 (×13): 60 mg via SUBCUTANEOUS
  Filled 2014-07-13 (×14): qty 0.6

## 2014-07-13 MED ORDER — WARFARIN SODIUM 7.5 MG PO TABS: 7.5000 mg | ORAL_TABLET | Freq: Once | ORAL | Status: DC

## 2014-07-13 MED ORDER — DEXTROSE 5 % IV SOLN
0.0000 mg/h | INTRAVENOUS | Status: DC
  Administered 2014-07-13 – 2014-07-17 (×5): 5 mg/h via INTRAVENOUS
  Administered 2014-07-20: 7.5 mg/h via INTRAVENOUS
  Administered 2014-07-21: 5 mg/h via INTRAVENOUS
  Filled 2014-07-13 (×8): qty 100

## 2014-07-13 NOTE — Progress Notes (Signed)
Dr. Allena KatzPatel was able to place NG tube under fluoroscopy. NG output so far 1400 mL. IV fluid rate increased to 100 mL per hour. Will check CBC and metabolic 7 in a.m.

## 2014-07-13 NOTE — Progress Notes (Addendum)
ANTICOAGULATION CONSULT NOTE  Pharmacy Consult for Coumadin/Lovenox Indication: atrial fibrillation  No Known Allergies  Patient Measurements: Height: 6\' 1"  (185.4 cm) Weight: 168 lb 3.4 oz (76.3 kg) IBW/kg (Calculated) : 79.9  Vital Signs: Temp: 97.9 F (36.6 C) (11/16 0802) Temp Source: Axillary (11/16 0802) BP: 150/99 mmHg (11/16 0600) Pulse Rate: 79 (11/16 0600)  Labs:  Recent Labs  07/10/2014 0542  07/21/2014 0919 07/12/14 0714 07/12/14 1220 07/13/14 0441  HGB  --   < > 11.3* 12.6*  --  12.6*  HCT  --   --  34.7* 38.2*  --  37.9*  PLT  --   --  153 185  --  204  LABPROT  --   --  18.6*  --  16.7* 17.1*  INR  --   --  1.54*  --  1.34 1.38  CREATININE 1.00  --   --  0.93 0.89  --   < > = values in this interval not displayed.  Estimated Creatinine Clearance: 60.7 mL/min (by C-G formula based on Cr of 0.89).   Medical History: Past Medical History  Diagnosis Date  . Osteoarthritis   . Chronic diarrhea   . Hypertension   . Fracture of vertebra 10/2013    Lumbar  . Epidural hematoma 04/2014     [facility]  . Chronic systolic heart failure 04/2014    at [facility]-ejection fraction 45-50%; severe ulnar hypertension  . AICD (automatic cardioverter/defibrillator) present   . Atrial fibrillation     On coumadin  . Multiple fractures of thoracic spine 04/2014    [facility]  . Cervical spine fracture 04/2014    [facility]  . Cholelithiasis Dec 19, 2013  . Tremor of both hands   . Chronic anticoagulation     With warfarin  . Pulmonary hypertension 04/2014    [facility]  . Atrial dilatation, bilateral 04/2014    [facility]  . Duodenitis with bleeding 07/19/2014    Medications:  Prescriptions prior to admission  Medication Sig Dispense Refill Last Dose  . ALPRAZolam (XANAX) 0.25 MG tablet Take 0.125 mg by mouth every 6 (six) hours as needed for anxiety.    07/08/2014 at Unknown time  .  amoxicillin-clavulanate (AUGMENTIN) 875-125 MG per tablet Take 1 tablet by mouth 2 (two) times daily. Starting 07/06/2014 and ending 07/10/2014.   07/08/2014 at Unknown time  . aspirin 81 MG tablet Take 81 mg by mouth daily.   07/08/2014 at Unknown time  . atorvastatin (LIPITOR) 10 MG tablet Take 10 mg by mouth daily.   07/08/2014 at Unknown time  . escitalopram (LEXAPRO) 10 MG tablet Take 10 mg by mouth daily.   07/08/2014 at Unknown time  . lisinopril (PRINIVIL,ZESTRIL) 10 MG tablet Take 10 mg by mouth daily.   07/08/2014 at Unknown time  . metoprolol (LOPRESSOR) 50 MG tablet Take 50 mg by mouth 2 (two) times daily.   07/08/2014 at 2000  . warfarin (COUMADIN) 6 MG tablet Take 6 mg by mouth daily.   07/08/2014 at Unknown time  . docusate sodium (COLACE) 100 MG capsule Take 200 mg by mouth daily.   Taking  . warfarin (COUMADIN) 4 MG tablet Take 4 mg by mouth every other day. Alternate between 4mg  and 5mg    Taking  . warfarin (COUMADIN) 5 MG tablet Take 5 mg by mouth every other day. Alternate between 4mg  and 5mg    Taking  Assessment: 80 yoM on chronic Coumadin for Afib.  INR therapeutic at upper end of goal range on admission.   He was admitted with UGIB and Coumadin was reversed with total 5mg  Vit K + 1 unit FFP.  S/P EGD on 11/14.  Asked to resume Coumadin.  INR is still trending down.   Dr Sherrie MustacheFisher would like to bridge with Lovenox until INR >2.  Discussed risk vs. benefit of Lovenox with her & agreed to use prophylactic Lovenox dose.  No active bleeding noted.   Goal of Therapy:  INR 2-3 Monitor platelets by anticoagulation protocol: Yes  Plan:  Coumadin 7.5mg  po x1 today Lovenox 40mg  sq daily Daily INR Monitor CBC  Shakita Keir, Mercy RidingAndrea Michelle 07/13/2014,8:27 AM  Addendum: Patient is now NPO for possible short bowel.  Coumadin being held.  Further discussed with Dr Sherrie MustacheFisher and will increase Lovenox to 0.75mg /kg q12h for treatment of Afib in elderly patient >78yo. Lovenox 60mg  sq  q12h CBC on MWF Junita PushMichelle Ashleyanne Hemmingway, PharmD, BCPS 07/13/2014@9 :17 AM

## 2014-07-13 NOTE — Progress Notes (Signed)
[facility] PROGRESS NOTE  Mark Doverhomas Dutil ZOX:[id number] DOB: 03/04/1923 DOA: 07/26/2014 PCP: Kirstie PeriSHAH,ASHISH, MD    Code Status: full code Family Communication: discussed with wife on 07/13/14 via phone call Disposition Plan: [facility] to the [facility] when clinically appropriate.   Consultants:  Gastroenterologist, Dr. Karilyn Cotaehman  General surgeon, Dr. Cherre RobinsJenkins-Signed off  Procedures: 08/30/2013 EGD:  Impression: Scant amount of fresh blood noted in hypopharynx without bleeding lesion. This may be related to trauma from NG tube. Small sliding hiatal hernia; bulbar duodenitis.  Antibiotics:  none  HPI/Subjective: Nursing reported the patient had a small amount of emesis early this morning. He denies passing flatus. He denies having a bowel movement overnight for yesterday afternoon. He denies abdominal pain.   Objective: Filed Vitals:   07/13/14 0802  BP:   Pulse:   Temp: 97.9 F (36.6 C)  Resp:    Temperature 97.9. Heart rate 79-123. Respirate 25. Blood pressure 150/99. Oxygen saturation 98% on supplemental oxygen.    Intake/Output Summary (Last 24 hours) at 07/13/14 0859 Last data filed at 07/13/14 0600  Gross per 24 hour  Intake   1381 ml  Output    350 ml  Net   1031 ml   Filed Weights   08/30/2013 0500 07/12/14 0500 07/13/14 0500  Weight: 74.5 kg (164 lb 3.9 oz) 76.3 kg (168 lb 3.4 oz) 76.3 kg (168 lb 3.4 oz)    Exam:   General:  Chronically ill-appearing 80 year old man in no acute distress.  Neck: Large neck brace on extending to mid chest.  Cardiovascular: irregular, irregular with a 2 to 3/6 systolic murmur.  Respiratory: exam difficult with brace on, but clear anteriorly and few crackles auscultated in the bases  Abdomen:hypoactive bowel sounds, soft, minimally distended, no tenderness; no masses palpated.  Musculoskeletal: no acute hot red joints; pedal pulses palpable; no pedal edema.  Neurologic: He  is alert and has a strong voice. He  is oriented to himself in hospital. His speech is clear.  Data Reviewed: Basic Metabolic Panel:  Recent Labs Lab 07/05/2014 1443 07/15/2014 2002 07/10/14 0219 08/30/2013 0542 07/12/14 0714 07/12/14 1220  NA  --  139 140 144 144 144  K  --  3.7 3.8 4.5 4.1 4.1  CL  --  102 103 108 106 106  CO2  --  27 27 28 29 28   GLUCOSE  --  115* 109* 97 109* 137*  BUN  --  27* 24* 17 12 12   CREATININE  --  1.05 1.02 1.00 0.93 0.89  CALCIUM  --  9.2 9.1 9.2 9.4 9.3  MG 1.8  --   --   --   --   --    Liver Function Tests:  Recent Labs Lab 07/12/2014 0939 07/10/14 0219  AST 23 21  ALT 14 14  ALKPHOS 139* 112  BILITOT 1.7* 1.1  PROT 7.8 6.2  ALBUMIN 3.8 3.0*    Recent Labs Lab 07/05/2014 0939  LIPASE 52    Recent Labs Lab 07/23/2014 1355  AMMONIA 23   CBC:  Recent Labs Lab 07/01/2014 0939  07/10/14 0219 07/10/14 0818 08/30/2013 0919 07/12/14 0714 07/13/14 0441  WBC 9.2  < > 6.3 6.7 5.1 6.6 7.8  NEUTROABS 6.8  --   --   --   --   --   --   HGB 13.8  < > 11.4* 12.1* 11.3* 12.6* 12.6*  HCT 41.0  < > 34.1* 36.5* 34.7* 38.2* 37.9*  MCV 95.6  < >  96.9 98.1 99.4 99.2 97.9  PLT 228  < > 168 170 153 185 204  < > = values in this interval not displayed. Cardiac Enzymes:  Recent Labs Lab 07/08/2014 0939  TROPONINI <0.30   BNP (last 3 results)  Recent Labs  06/28/2014 0939  PROBNP 3979.0*   CBG:  Recent Labs Lab 07/20/2014 1157 07/03/2014 1336 07/03/2014 1824  GLUCAP 105* 101* 114*    Recent Results (from the past 240 hour(s))  MRSA PCR Screening     Status: None   Collection Time: 07/19/2014  3:15 PM  Result Value Ref Range Status   MRSA by PCR NEGATIVE NEGATIVE Final    Comment:        The GeneXpert MRSA Assay (FDA approved for NASAL specimens only), is one component of a comprehensive MRSA colonization surveillance program. It is not intended to diagnose MRSA infection nor to guide or monitor treatment for MRSA infections.      Studies: Dg Abd Acute  W/chest  07/12/2014   CLINICAL DATA:  Small bowel obstruction.  Subsequent encounter.  EXAM: ACUTE ABDOMEN SERIES (ABDOMEN 2 VIEW & CHEST 1 VIEW)  COMPARISON:  07/23/2014.  FINDINGS: There is no interval change in the chest with cardiomegaly, interstitial pulmonary edema and small bilateral pleural effusions compatible with mild to moderate CHF. Enlargement of vascular pedicle. LEFT subclavian AICD.  No free air underneath the hemidiaphragms. Gaseous distention of the stomach is present. There is no nasogastric tube present. Rectum is not visible on these radiographs. Small bowel loops are dilated, maximally measuring 46 mm. Monitoring leads project over the abdomen. Atherosclerosis. Upper lumbar vertebral augmentation.  IMPRESSION: 1. Dilated small bowel loops in the central abdomen, compatible with small bowel obstruction or ileus. 2. Mild to moderate CHF.   Electronically Signed   By: Andreas Newport M.D.   On: 07/12/2014 12:52    Scheduled Meds: . sodium chloride   Intravenous Once  . antiseptic oral rinse  7 mL Mouth Rinse BID  . enoxaparin (LOVENOX) injection  40 mg Subcutaneous Q24H  . lisinopril  10 mg Oral Daily  . metoprolol tartrate  50 mg Oral BID  . ondansetron (ZOFRAN) IV  4 mg Intravenous 4 times per day  . pantoprazole (PROTONIX) IV  40 mg Intravenous Q12H  . warfarin  7.5 mg Oral Once  . Warfarin - Pharmacist Dosing Inpatient   Does not apply Q24H   Continuous Infusions: . dextrose 5 % and 0.9 % NaCl with KCl 20 mEq/L 50 mL/hr at 07/13/14 0600   Assessment and plan:  Principal Problem:   Duodenitis with bleeding Active Problems:   Hematemesis   Warfarin-induced coagulopathy   SBO (small bowel obstruction)   Cholelithiasis   Atrial fibrillation   AICD (automatic cardioverter/defibrillator) present   Acute on chronic systolic heart failure   Nausea and vomiting   Multiple fractures of thoracic spine   1. Upper GI bleed/hematemesis secondary to duodenitis in the  setting of anticoagulation. The patient was evaluated by Dr. Karilyn Cota with an EGD on 07/21/2014. It revealed duodenitis which was likely the cause of his hematemesis. The small amount of blood seen around the hypopharynx was likely secondary to NG tube trauma.  H pylori serology pending He has had no further nausea, vomiting, or hematemesis. Clear liquids started 11/15, but the patient had some emesis overnight. We'll make him nothing by mouth again.  Continue IV Protonix.  Mild acute blood loss anemia. His hemoglobin has decreased minimally in the setting of  equilibration and hemodilution. We'll continue to monitor his CBC. No need for packed red blood cell transfusion currently.  Small bowel obstruction versus ileus. Acute abdominal series on 11/14 revealed improvement in bowel obstruction, but abdominal x-ray on 11/15 revealed a return of dilated small bowel loops. He did have a bowel movement on 11/15 following fleets enema. Dr. Karilyn Cotaehman mildly decompressed him on 11/14 via the EGD. General surgeon, Dr. Lovell SheehanJenkins was consulted following admission, but he felt that the patient was not a surgical candidate in this hospital because of his comorbid conditions. Will make the patient nothing by mouth again and attempt to reinsert an NG tube for decompression. Hopefully with conservative/medical management, his bowel obstruction will resolve. We'll continue when necessary IV Zofran.  Chronic atrial fibrillation, on chronic Coumadin. His rate is increasing. Now that he is nothing by mouth, will start him on a diltiazem drip. We'll hold on IV metoprolol. He is status post Coumadin reversal for the EGD. Will start a slightly reduced dose of Lovenox Per pharmacy, 11/16.  He was given 1 dose of Coumadin 1 on 11/15.  Chronic systolic congestive heart failure; status post ICD; severe pulmonary hypertension; and severe biatrial dilatation, [facility]. -Chest x-ray on 11/15  revealed pulmonary edema, so acute on chronic systolic heart failure. This may be the consequence of small bowel obstruction. He is being gently hydrated given his nothing by mouth status. We'll give 70 cc an hour and change fluids D5 half-normal saline with potassium chloride added. Will add Lasix at 20 mg IV daily.   Chronic pacemaker  Hypertension. His blood pressure is trending up. Lisinopril and metoprolol are on hold. We'll start diltiazem drip for rate control and blood pressure control.  Urinary retention, per nursing staff. Foley catheter inserted on 07/12/2014. Urinalysis essentially negative. Continue to monitor. We'll refer him to urology as an outpatient.  C6, T2, and T3 cervical fractures, status post fall in September 2015. Treated with a brace, [facility]. Currently stable.  History of subarachnoid hemorrhage, status post fall at home in September 2015. CT of the head in the ED revealed no acute findings.  Question of PE on CT scan of his abdomen and pelvis. VQ scan revealed low probability of PE in the setting of chronic anticoagulation.     Time spent: 30 minutes    [facility] Ms State HospitalFISHER,Mark Lawes  [facility] Pager [phone]. If 7PM-7AM, please contact night-coverage at [email or web address], password [email or web address] 07/13/2014, 8:59 AM  LOS: 4 days

## 2014-07-13 NOTE — Progress Notes (Signed)
  Subjective:  Patient does experience nausea and vomited a few cc of bilious fluid. He denies abdominal pain. He had a BM yesterday but he has not passed flatus.   Objective: Blood pressure 150/99, pulse 79, temperature 97.9 F (36.6 C), temperature source Axillary, resp. rate 25, height 6\' 1"  (1.854 m), weight 168 lb 3.4 oz (76.3 kg), SpO2 98 %. Patient is alert and in no acute distress. Cardiac exam with regular rhythm normal S1 and S2. No murmur or gallop noted. Lungs are clear to auscultation. Abdomen is full.bowel sounds are hypoactive. On palpation it soft and nontender without organomegaly or masses. No LE edema or clubbing noted.  Labs/studies Results:   Recent Labs  12/11/13 0919 07/12/14 0714 07/13/14 0441  WBC 5.1 6.6 7.8  HGB 11.3* 12.6* 12.6*  HCT 34.7* 38.2* 37.9*  PLT 153 185 204    BMET   Recent Labs  12/11/13 0542 07/12/14 0714 07/12/14 1220  NA 144 144 144  K 4.5 4.1 4.1  CL 108 106 106  CO2 28 29 28   GLUCOSE 97 109* 137*  BUN 17 12 12   CREATININE 1.00 0.93 0.89  CALCIUM 9.2 9.4 9.3     Recent Labs  07/12/14 1220 07/13/14 0441  LABPROT 16.7* 17.1*  INR 1.34 1.38    Acute abdominal series from 07/12/2014 reviewed. Dilated loops of small bowel.  Assessment:  #1. UGI bleed. Inactive. H&H is stable. #2. SBO. He has not responded to therapy. We are tract needs to be decompressed with NG suction and hopefully will reverse small bowel obstruction.  Recommendations;  NG tube for decompression. Metabolic 7 in a.m..Marland Kitchen

## 2014-07-13 NOTE — Clinical Social Work Note (Signed)
CSW spoke with [facility]. Staff advised that patient's bed was still available.   Lonnie Reth, LCSW [phone] 

## 2014-07-14 DIAGNOSIS — I4891 Unspecified atrial fibrillation: Secondary | ICD-10-CM

## 2014-07-14 LAB — PROTIME-INR
INR: 1.41 (ref 0.00–1.49)
Prothrombin Time: 17.4 seconds — ABNORMAL HIGH (ref 11.6–15.2)

## 2014-07-14 LAB — BASIC METABOLIC PANEL
Anion gap: 10 (ref 5–15)
BUN: 7 mg/dL (ref 6–23)
CO2: 30 mEq/L (ref 19–32)
Calcium: 9 mg/dL (ref 8.4–10.5)
Chloride: 100 mEq/L (ref 96–112)
Creatinine, Ser: 0.9 mg/dL (ref 0.50–1.35)
GFR, EST AFRICAN AMERICAN: 85 mL/min — AB (ref >90)
GFR, EST NON AFRICAN AMERICAN: 73 mL/min — AB (ref >90)
Glucose, Bld: 119 mg/dL — ABNORMAL HIGH (ref 70–99)
Potassium: 3.8 mEq/L (ref 3.7–5.3)
SODIUM: 140 meq/L (ref 137–147)

## 2014-07-14 LAB — H.PYLORI ANTIGEN, STOOL

## 2014-07-14 LAB — TSH: TSH: 1.57 u[IU]/mL (ref 0.350–4.500)

## 2014-07-14 NOTE — Progress Notes (Signed)
ANTICOAGULATION CONSULT NOTE  Pharmacy Consult for Coumadin/Lovenox Indication: atrial fibrillation  No Known Allergies  Patient Measurements: Height: 6\' 1"  (185.4 cm) Weight: 166 lb 7.2 oz (75.5 kg) IBW/kg (Calculated) : 79.9  Vital Signs: Temp: 97 F (36.1 C) (11/17 0400) Temp Source: Axillary (11/17 0400) BP: 120/66 mmHg (11/17 0600) Pulse Rate: 88 (11/17 0600)  Labs:  Recent Labs  07/12/14 0714 07/12/14 1220 07/13/14 0441 07/13/14 1757 07/14/14 0417  HGB 12.6*  --  12.6* 12.0*  --   HCT 38.2*  --  37.9* 36.0*  --   PLT 185  --  204 200  --   LABPROT  --  16.7* 17.1*  --  17.4*  INR  --  1.34 1.38  --  1.41  CREATININE 0.93 0.89  --  0.90 0.90   Estimated Creatinine Clearance: 59.4 mL/min (by C-G formula based on Cr of 0.9).  Medical History: Past Medical History  Diagnosis Date  . Osteoarthritis   . Chronic diarrhea   . Hypertension   . Fracture of vertebra 10/2013    Lumbar  . Epidural hematoma 04/2014     subarachnoid hemorrhage status [facility] fall[facility]  . Chronic systolic heart failure 04/2014    at [facility]-ejection fraction 45-50%; severe ulnar hypertension  . AICD (automatic cardioverter/defibrillator) present   . Atrial fibrillation     On coumadin  . Multiple fractures of thoracic spine 04/2014    [facility]  . Cervical spine fracture 04/2014    [facility]  . Cholelithiasis 07/12/2014  . Tremor of both hands   . Chronic anticoagulation     With warfarin  . Pulmonary hypertension 04/2014    [facility]  . Atrial dilatation, bilateral 04/2014    [facility]  . Duodenitis with bleeding 2014-05-29   Medications:  Prescriptions prior to admission  Medication Sig Dispense Refill Last Dose  . ALPRAZolam (XANAX) 0.25 MG tablet Take 0.125 mg by mouth every 6 (six) hours as needed for anxiety.    07/08/2014 at Unknown time  . amoxicillin-clavulanate (AUGMENTIN) 875-125 MG per tablet Take 1 tablet by mouth 2 (two)  times daily. Starting 07/06/2014 and ending 07/10/2014.   07/08/2014 at Unknown time  . aspirin 81 MG tablet Take 81 mg by mouth daily.   07/08/2014 at Unknown time  . atorvastatin (LIPITOR) 10 MG tablet Take 10 mg by mouth daily.   07/08/2014 at Unknown time  . escitalopram (LEXAPRO) 10 MG tablet Take 10 mg by mouth daily.   07/08/2014 at Unknown time  . lisinopril (PRINIVIL,ZESTRIL) 10 MG tablet Take 10 mg by mouth daily.   07/08/2014 at Unknown time  . metoprolol (LOPRESSOR) 50 MG tablet Take 50 mg by mouth 2 (two) times daily.   07/08/2014 at 2000  . warfarin (COUMADIN) 6 MG tablet Take 6 mg by mouth daily.   07/08/2014 at Unknown time  . docusate sodium (COLACE) 100 MG capsule Take 200 mg by mouth daily.   Taking  . warfarin (COUMADIN) 4 MG tablet Take 4 mg by mouth every other day. Alternate between 4mg  and 5mg    Taking  . warfarin (COUMADIN) 5 MG tablet Take 5 mg by mouth every other day. Alternate between 4mg  and 5mg    Taking   Assessment: 89 yoM on chronic Coumadin for Afib.  INR therapeutic at upper end of goal range on admission.   He was admitted with UGIB and Coumadin was reversed with total 5mg   Vit K + 1 unit FFP.  S/P EGD on 11/14.  Asked to resume Coumadin.  INR is still trending down.   Dr Sherrie MustacheFisher would like to bridge with Lovenox until INR >2.  Discussed risk vs. benefit of Lovenox with her & agreed to use prophylactic Lovenox dose.  No active bleeding noted.   Goal of Therapy:  INR 2-3 Monitor platelets by anticoagulation protocol: Yes  Plan:   Addendum: Patient is now NPO for possible short bowel.  Coumadin being held.  Further discussed with Dr Sherrie MustacheFisher and will increase Lovenox to 0.75mg /kg q12h for treatment of Afib in elderly patient >78yo. Lovenox 60mg  sq q12h CBC on MWF Junita PushMichelle Lilliston, PharmD, BCPS 07/14/2014@7 :53 AM

## 2014-07-14 NOTE — Progress Notes (Signed)
[facility] PROGRESS NOTE  Mark Day ZOX:[id number] DOB: 02/06/1923 DOA: 11-22-2013 PCP: Kirstie PeriSHAH,ASHISH, MD    Code Status: full code Family Communication: discussed with wife on 07/13/14 via phone call Disposition Plan: discharge to the [facility] when clinically appropriate.   Consultants:  Gastroenterologist, Dr. Karilyn Cotaehman  General surgeon, curb side consult Dr. Lovell SheehanJenkins  Procedures: 07/10/2014 EGD:  Impression: Scant amount of fresh blood noted in hypopharynx without bleeding lesion. This may be related to trauma from NG tube. Small sliding hiatal hernia; bulbar duodenitis.  Antibiotics:  none  HPI/Subjective: Patient reports no passing of flatus or bowel movements. He says that his abdomen feels much better with the NG tube in. No nausea or vomiting.  Objective: Filed Vitals:   07/14/14 0759  BP:   Pulse:   Temp: 97.9 F (36.6 C)  Resp:    Temperature 97.9. Pulse rate 88. Respiratory rate 18. Blood pressure 120/66. Oxygen saturation 99% on room air.    Intake/Output Summary (Last 24 hours) at 07/14/14 0848 Last data filed at 07/14/14 0600  Gross per 24 hour  Intake 1813.42 ml  Output   4850 ml  Net -3036.58 ml   Filed Weights   07/12/14 0500 07/13/14 0500 07/14/14 0500  Weight: 76.3 kg (168 lb 3.4 oz) 76.3 kg (168 lb 3.4 oz) 75.5 kg (166 lb 7.2 oz)    Exam:   General:  Chronically ill-appearing [age] year old man in no acute distress.  NG tube in left nostril, draining bilious fluid.  Neck: Large neck brace on extending to mid chest.  Cardiovascular: irregular, irregular with a 2 to 3/6 systolic murmur.  Respiratory: exam difficult with brace on, but clear anteriorly and decreased breath sounds in the bases, all no audible crackles.  Abdomen:    Abdomen significantly less distended and softer; hypoactive bowel sounds; nontender.   Musculoskeletal: no acute hot red joints; pedal pulses palpable; no pedal edema.  Neurologic: He  is alert.  He is oriented to himself in hospital. His speech is clear. Cranial nerves II through XII are grossly intact.  Data Reviewed: Basic Metabolic Panel:  Recent Labs Lab 10-25-13 1443  07/05/2014 0542 07/12/14 0714 07/12/14 1220 07/13/14 1757 07/14/14 0417  NA  --   < > 144 144 144 138 140  K  --   < > 4.5 4.1 4.1 3.7 3.8  CL  --   < > 108 106 106 99 100  CO2  --   < > 28 29 28 29 30   GLUCOSE  --   < > 97 109* 137* 128* 119*  BUN  --   < > 17 12 12 8 7   CREATININE  --   < > 1.00 0.93 0.89 0.90 0.90  CALCIUM  --   < > 9.2 9.4 9.3 9.1 9.0  MG 1.8  --   --   --   --   --   --   < > = values in this interval not displayed. Liver Function Tests:  Recent Labs Lab 10-25-13 0939 07/10/14 0219  AST 23 21  ALT 14 14  ALKPHOS 139* 112  BILITOT 1.7* 1.1  PROT 7.8 6.2  ALBUMIN 3.8 3.0*    Recent Labs Lab 10-25-13 0939  LIPASE 52    Recent Labs Lab 10-25-13 1355  AMMONIA 23   CBC:  Recent Labs Lab 10-25-13 0939  07/10/14 0818 07/02/2014 0919 07/12/14 0714 07/13/14 0441 07/13/14 1757  WBC 9.2  < > 6.7 5.1 6.6 7.8 7.4  NEUTROABS 6.8  --   --   --   --   --   --   HGB 13.8  < > 12.1* 11.3* 12.6* 12.6* 12.0*  HCT 41.0  < > 36.5* 34.7* 38.2* 37.9* 36.0*  MCV 95.6  < > 98.1 99.4 99.2 97.9 96.5  PLT 228  < > 170 153 185 204 200  < > = values in this interval not displayed. Cardiac Enzymes:  Recent Labs Lab 11/08/13 0939  TROPONINI <0.30   BNP (last 3 results)  Recent Labs  11/08/13 0939  PROBNP 3979.0*   CBG:  Recent Labs Lab 11/08/13 1157 11/08/13 1336 11/08/13 1824  GLUCAP 105* 101* 114*    Recent Results (from the past 240 hour(s))  MRSA PCR Screening     Status: None   Collection Time: 11/08/13  3:15 PM  Result Value Ref Range Status   MRSA by PCR NEGATIVE NEGATIVE Final    Comment:        The GeneXpert MRSA Assay (FDA approved for NASAL specimens only), is one component of a comprehensive MRSA colonization surveillance program. It is  not intended to diagnose MRSA infection nor to guide or monitor treatment for MRSA infections.   H.pylori Antigen, Stool     Status: None   Collection Time: 06/29/2014 12:45 PM  Result Value Ref Range Status   Specimen Description STOOL  Final   Special Requests NONE  Final   H. pylori ag, stool   Final    NEGATIVE Antimicrobials,proton pump inhibitors and bismuth preparations are known to suppress H.pylori and ingestion of these prior to testing may cause a false negative result. If a negative result is obtained for a patient that has  ingested these  compounds within two weeks prior of performing the H.pylori test, results may be falsely negative and should be repeated with a new specimen two weeks after discontinuing treatment. Performed at [facility]    Report Status 07/14/2014 FINAL  Final     Studies: Dg Basil Dessaso G Tube Plc W/fl W/rad  07/13/2014   CLINICAL DATA:  Small bowel obstruction, failed attempts at NG tube placement  EXAM: NASO G TUBE PLACEMENT WITH FL AND WITH RAD  FLUOROSCOPY TIME:  30 seconds  COMPARISON:  None  FINDINGS: The patient is in a cervical collar. The examination was explained to the patient. A nasogastric tube was inserted through the left nare. The nasogastric tube was advance into the stomach. The tip of the nasogastric tube there is within the body of the stomach. The nasogastric tube was secured to the patient with tape. The patient tolerated the procedure well.  IMPRESSION: 1. Successful placement of a nasogastric tube under fluoroscopy.   Electronically Signed   By: Elige KoHetal  Patel   On: 07/13/2014 15:54    Scheduled Meds: . sodium chloride   Intravenous Once  . antiseptic oral rinse  7 mL Mouth Rinse BID  . enoxaparin (LOVENOX) injection  60 mg Subcutaneous Q12H  . furosemide  20 mg Intravenous Daily  . lisinopril  10 mg Oral Daily  . ondansetron (ZOFRAN) IV  4 mg Intravenous 4 times per day  . pantoprazole (PROTONIX) IV  40 mg Intravenous Q12H    Continuous Infusions: . dextrose 5 % and 0.45 % NaCl with KCl 40 mEq/L 100 mL/hr at 07/14/14 0828  . diltiazem (CARDIZEM) infusion 5 mg/hr (07/14/14 0600)   Assessment and plan:  Principal Problem:   Duodenitis with bleeding Active Problems:   Hematemesis  Warfarin-induced coagulopathy   SBO (small bowel obstruction)   Cholelithiasis   Atrial fibrillation   AICD (automatic cardioverter/defibrillator) present   Acute on chronic systolic heart failure   Nausea and vomiting   Multiple fractures of thoracic spine   1. Upper GI bleed/hematemesis secondary to duodenitis in the setting of anticoagulation. The patient was evaluated by Dr. Karilyn Cota with an EGD on . It revealed duodenitis which was likely the cause of his hematemesis. The small amount of blood seen around the hypopharynx was likely secondary to NG tube trauma.  Stool H pylori antigen was negative. He has had no further hematemesis. Clear liquids started 11/15, but the patient had some emesis.  He is now nothing by mouth with NG tube drainage. Continue IV Protonix.  Mild acute blood loss anemia. His hemoglobin has decreased minimally in the setting of equilibration and hemodilution. We'll continue to monitor his CBC. No need for packed red blood cell transfusion currently.  Small bowel obstruction versus ileus. Acute abdominal series on 11/14 revealed improvement in bowel obstruction, but abdominal x-ray on 11/15 revealed a return of dilated small bowel loops. He did have a bowel movement on 11/15 following fleets enema. Dr. Karilyn Cota mildly decompressed him on 11/14 via the EGD. General surgeon, Dr. Lovell Sheehan was consulted following admission, but he felt that the patient was not a surgical candidate in this hospital because of his comorbid conditions. Dr. Karilyn Cota asked radiology to place the NG tube 11/16 successfully. Following the insertion, it drained 1400 cc. So far, it has drained almost 1900 cc. Dr. Karilyn Cota we'll  order worse soluble contrast via the NG in the next 24-48 hours to assess for ongoing blockage. If small bowel obstruction does not resolve with medical management, he would need transfer to [facility] for surgical evaluation. We'll continue when necessary IV Zofran.  Chronic atrial fibrillation, on chronic Coumadin. He was started on a gentle diltiazem drip for mild RVR and treatment of hypertension. Blood pressure and heart rate now within normal limits. He is status post Coumadin reversal for the EGD. Slightly reduced dose of Lovenox Per pharmacy started 11/16.  He was given 1 dose of Coumadin 1 on 11/15. TSH pending.  Chronic systolic congestive heart failure; status post ICD; severe pulmonary hypertension; and severe biatrial dilatation, per review of echo at [facility] September 2015. -Chest x-ray on 11/15 revealed pulmonary edema, so acute on chronic systolic heart failure. This may be the consequence of small bowel obstruction. Pulmonary edema seen on chest x-ray on 11/15 was likely fluid movement from bowel obstruction to the basilar lungs. Clinically he did not appear to be in congestive heart failure. Status post 20 mg of IV Lasix 1. Given the NG tube drainage, will continue to gently hydrate with D5 normal saline with potassium chloride added.   Chronic pacemaker  Hypertension. His blood pressure trended up. Lisinopril and metoprolol are on hold. Continue gentle diltiazem drip.  Urinary retention, per nursing staff. Foley catheter inserted on 08/06/2014. Urinalysis essentially negative. Continue to monitor. We'll refer him to urology as an outpatient.  C6, T2, and T3 cervical fractures, status post fall in September 2015. Treated with a brace, currently in place at [facility]. Currently stable.  History of subarachnoid hemorrhage, status post fall at home in September 2015. CT of the head in the ED revealed no acute findings.  Question of PE on CT scan of his abdomen  and pelvis. VQ scan revealed low probability of PE in the setting of chronic anticoagulation.  Time spent: 30 minutes    [facility]  [facility] Pager [phone]. If 7PM-7AM, please contact night-coverage at [email or web address], password [facility] 07/14/2014, 8:48 AM  LOS: 5 days

## 2014-07-14 NOTE — Progress Notes (Signed)
  Subjective:  Patient states he feels much better but he cannot tell me in what way. He denies nausea or abdominal pain. He has not passed flatus yet.  Objective: Blood pressure 120/66, pulse 88, temperature 97.9 F (36.6 C), temperature source Axillary, resp. rate 18, height 6\' 1"  (1.854 m), weight 166 lb 7.2 oz (75.5 kg), SpO2 99 %. Patient is alert and appears to be comfortable. NG tube is draining bilious fluid. Cardiac exam with regular rhythm normal S1 and S2. No murmur or gallop noted. Lungs are clear to auscultation. Abdomen is flat. Bowel sounds are hypoactive. Abdomen is soft and nontender without organomegaly or masses. No LE edema or clubbing noted.  Urine output last 24 hours 3000 mL. NG return last 17 hours 1850 mL  Labs/studies Results:   Recent Labs  07/12/14 0714 07/13/14 0441 07/13/14 1757  WBC 6.6 7.8 7.4  HGB 12.6* 12.6* 12.0*  HCT 38.2* 37.9* 36.0*  PLT 185 204 200    BMET   Recent Labs  07/12/14 1220 07/13/14 1757 07/14/14 0417  NA 144 138 140  K 4.1 3.7 3.8  CL 106 99 100  CO2 28 29 30   GLUCOSE 137* 128* 119*  BUN 12 8 7   CREATININE 0.89 0.90 0.90  CALCIUM 9.3 9.1 9.0      Recent Labs  07/13/14 0441 07/14/14 0417  LABPROT 17.1* 17.4*  INR 1.38 1.41      Assessment:  #1. Distal SBO possibly secondary to adhesions or stricture. Abdominal distention has resolved with NG suction. He is still not passing flatus. #2. Upper GI bleed and active. H&H is stable.  Recommendations;  Continue NG suction. Acute abdominal series in a.m. Metabolic 7 in a.m.

## 2014-07-14 NOTE — Clinical Social Work Note (Signed)
CSW met with patient and wife.  Patient was alert and oriented.  Patient indicated that he felt much better.  Patient inquired about whether he was paying to have his bed held at [facility]. CSW advised that patient's and/or his wife would have to check with [facility] about that issue.  Patient wife indicated that she was willing to pay for bed at [facility] should holding his bed become an issue.    Ambrose Pancoast, [place]

## 2014-07-14 NOTE — Progress Notes (Signed)
NUTRITION FOLLOW UP  Intervention:   -RD to continue to follow for diet advancement -If pt unable to transition to PO diet within 7-10 days of admission, consider initiation of TPN  Nutrition Dx:   Inadequate oral intake related to Upper GI bleed as evidenced by NPO; ongoing  Goal:   Pt to meet >/= 90% of their estimated nutrition needs   Monitor:   Diet advancement, nutrition support measures, Po intake, labs and wt trends   Assessment:   [facility] He has upper GIB, hematemesis, acute blood loss anemia, possible small bowel obstruction, chronic CHF, multiple fx of thoracic spine, cholelithiasis. Pt has 10% wt loss this past year. He is at risk for malnutrition due to his upper GI bleed/NPO status which is limiting his ability to take in nutrition orally  Pt was advanced to a clear liquid diet on 07/12/14, but was unable to tolerate due to emesis.  NGT was inserted on 07/13/14 due to SBO, as pt did not respond to conservative therapy. Chart review indicates that NG out pt has been approximately 1850 ml thus far.  Noted a 4# (2.5%) wt gain x 4 days, likely due to positive fluid balance.  Labs reviewed. Glucose: 119. Mg and K WDL.   Height: Ht Readings from Last 1 Encounters:  07/27/2014 6\' 1"  (1.854 m)    Weight Status:   Wt Readings from Last 1 Encounters:  07/14/14 166 lb 7.2 oz (75.5 kg)     07/10/14 161 lb 14.4 oz (73.437 kg)   Re-estimated needs:  Kcal: 1900-2100 Protein: 90-100 grams Fluid: 1.9-2.1 L  Skin: scattered ecchymosis  Diet Order:  NPO   Intake/Output Summary (Last 24 hours) at 07/14/14 1037 Last data filed at 07/14/14 0600  Gross per 24 hour  Intake 1813.42 ml  Output   4850 ml  Net -3036.58 ml    Last BM: 07/12/14   Labs:   Recent Labs Lab 07/05/2014 1443  07/12/14 1220 07/13/14 1757 07/14/14 0417  NA  --   < > 144 138 140  K  --   < > 4.1 3.7 3.8  CL  --   < > 106 99 100  CO2  --   < > 28 29 30   BUN  --   < > 12 8 7    CREATININE  --   < > 0.89 0.90 0.90  CALCIUM  --   < > 9.3 9.1 9.0  MG 1.8  --   --   --   --   GLUCOSE  --   < > 137* 128* 119*  < > = values in this interval not displayed.  CBG (last 3)  No results for input(s): GLUCAP in the last 72 hours.  Scheduled Meds: . sodium chloride   Intravenous Once  . antiseptic oral rinse  7 mL Mouth Rinse BID  . enoxaparin (LOVENOX) injection  60 mg Subcutaneous Q12H  . furosemide  20 mg Intravenous Daily  . lisinopril  10 mg Oral Daily  . ondansetron (ZOFRAN) IV  4 mg Intravenous 4 times per day  . pantoprazole (PROTONIX) IV  40 mg Intravenous Q12H    Continuous Infusions: . dextrose 5 % and 0.45 % NaCl with KCl 40 mEq/L 100 mL/hr at 07/14/14 0828  . diltiazem (CARDIZEM) infusion 5 mg/hr (07/14/14 0600)    Asti Mackley A. Mayford KnifeWilliams, RD, LDN Pager: [phone]

## 2014-07-15 ENCOUNTER — Inpatient Hospital Stay (HOSPITAL_COMMUNITY): Payer: Medicare Other

## 2014-07-15 DIAGNOSIS — K2981 Duodenitis with bleeding: Principal | ICD-10-CM

## 2014-07-15 LAB — BASIC METABOLIC PANEL
ANION GAP: 10 (ref 5–15)
BUN: 7 mg/dL (ref 6–23)
CO2: 30 meq/L (ref 19–32)
CREATININE: 0.95 mg/dL (ref 0.50–1.35)
Calcium: 9.6 mg/dL (ref 8.4–10.5)
Chloride: 96 mEq/L (ref 96–112)
GFR calc Af Amer: 83 mL/min — ABNORMAL LOW (ref >90)
GFR calc non Af Amer: 72 mL/min — ABNORMAL LOW (ref >90)
Glucose, Bld: 125 mg/dL — ABNORMAL HIGH (ref 70–99)
Potassium: 4.6 mEq/L (ref 3.7–5.3)
SODIUM: 136 meq/L — AB (ref 137–147)

## 2014-07-15 LAB — CBC
HCT: 39.8 % (ref 39.0–52.0)
Hemoglobin: 13.4 g/dL (ref 13.0–17.0)
MCH: 32.5 pg (ref 26.0–34.0)
MCHC: 33.7 g/dL (ref 30.0–36.0)
MCV: 96.6 fL (ref 78.0–100.0)
PLATELETS: 222 10*3/uL (ref 150–400)
RBC: 4.12 MIL/uL — AB (ref 4.22–5.81)
RDW: 16.1 % — AB (ref 11.5–15.5)
WBC: 7.5 10*3/uL (ref 4.0–10.5)

## 2014-07-15 LAB — GLUCOSE, CAPILLARY: GLUCOSE-CAPILLARY: 126 mg/dL — AB (ref 70–99)

## 2014-07-15 LAB — PROTIME-INR
INR: 1.41 (ref 0.00–1.49)
PROTHROMBIN TIME: 17.4 s — AB (ref 11.6–15.2)

## 2014-07-15 NOTE — Progress Notes (Signed)
[facility], oriented to room, call bell placed within reach. VSS stable. Family at bedside. Will continue to monitor.   07/15/14 1924  Vitals  Temp 98.2 F (36.8 C)  Temp Source Oral  BP 111/65 mmHg  BP Location Right Arm  BP Method Automatic  Patient Position (if appropriate) Lying  Pulse Rate (!) 101  Pulse Rate Source Dinamap  Resp 16  Oxygen Therapy  SpO2 98 %  O2 Device Nasal Cannula  O2 Flow Rate (L/min) 4 L/min  Height and Weight  Height 6\' 1"  (1.854 m)  Weight 66.9 kg (147 lb 7.8 oz) (bed)  Type of Scale Used Bed  Type of Weight Actual  BSA (Calculated - sq m) 1.86 sq meters  BMI (Calculated) 19.5  Weight in (lb) to have BMI = 25 189.1

## 2014-07-15 NOTE — Progress Notes (Signed)
ANTICOAGULATION CONSULT NOTE  Pharmacy Consult for Lovenox Indication: atrial fibrillation  No Known Allergies  Patient Measurements: Height: 6\' 1"  (185.4 cm) Weight: 162 lb 14.7 oz (73.9 kg) IBW/kg (Calculated) : 79.9  Vital Signs: Temp: 98.1 F (36.7 C) (11/18 1204) Temp Source: Oral (11/18 1204) BP: 122/73 mmHg (11/18 1130) Pulse Rate: 103 (11/18 1130)  Labs:  Recent Labs  07/13/14 0441 07/13/14 1757 07/14/14 0417 07/15/14 0518  HGB 12.6* 12.0*  --  13.4  HCT 37.9* 36.0*  --  39.8  PLT 204 200  --  222  LABPROT 17.1*  --  17.4* 17.4*  INR 1.38  --  1.41 1.41  CREATININE  --  0.90 0.90 0.95   Estimated Creatinine Clearance: 55.1 mL/min (by C-G formula based on Cr of 0.95).  Medical History: Past Medical History  Diagnosis Date  . Osteoarthritis   . Chronic diarrhea   . Hypertension   . Fracture of vertebra 10/2013    Lumbar  . Epidural hematoma 04/2014     subarachnoid hemorrhage status post fall[facility]  . Chronic systolic heart failure 04/2014    at [facility]-ejection fraction 45-50%; severe ulnar hypertension  . AICD (automatic cardioverter/defibrillator) present   . Atrial fibrillation     On coumadin  . Multiple fractures of thoracic spine 04/2014    [facility]  . Cervical spine fracture 04/2014    [facility]  . Cholelithiasis 07/06/2014  . Tremor of both hands   . Chronic anticoagulation     With warfarin  . Pulmonary hypertension 04/2014    [facility]  . Atrial dilatation, bilateral 04/2014    [facility]; severe  . Duodenitis with bleeding    Medications:  Prescriptions prior to admission  Medication Sig Dispense Refill Last Dose  . ALPRAZolam (XANAX) 0.25 MG tablet Take 0.125 mg by mouth every 6 (six) hours as needed for anxiety.    07/08/2014 at Unknown time  . amoxicillin-clavulanate (AUGMENTIN) 875-125 MG per tablet Take 1 tablet by mouth 2 (two) times daily. Starting 07/06/2014 and ending  07/10/2014.   07/08/2014 at Unknown time  . aspirin 81 MG tablet Take 81 mg by mouth daily.   07/08/2014 at Unknown time  . atorvastatin (LIPITOR) 10 MG tablet Take 10 mg by mouth daily.   07/08/2014 at Unknown time  . escitalopram (LEXAPRO) 10 MG tablet Take 10 mg by mouth daily.   07/08/2014 at Unknown time  . lisinopril (PRINIVIL,ZESTRIL) 10 MG tablet Take 10 mg by mouth daily.   07/08/2014 at Unknown time  . metoprolol (LOPRESSOR) 50 MG tablet Take 50 mg by mouth 2 (two) times daily.   07/08/2014 at 2000  . warfarin (COUMADIN) 6 MG tablet Take 6 mg by mouth daily.   07/08/2014 at Unknown time  . docusate sodium (COLACE) 100 MG capsule Take 200 mg by mouth daily.   Taking  . warfarin (COUMADIN) 4 MG tablet Take 4 mg by mouth every other day. Alternate between 4mg  and 5mg    Taking  . warfarin (COUMADIN) 5 MG tablet Take 5 mg by mouth every other day. Alternate between 4mg  and 5mg    Taking   Assessment: [age] yoM on chronic Coumadin PTA for Afib.  INR was therapeutic at upper end of goal range on admission.   He was admitted with UGIB and Coumadin was reversed with total 5mg  Vit K + 1 unit FFP.  S/P EGD on 11/14.  Coumadin is being held  and pt remains on Lovenox.  Pt has SBO and has not cleared with almost 2 days of NG suction. No active bleeding noted.  GI is following.  CBC is stable.  Goal of Therapy:  Lovenox bridge until Coumadin can be resumed. Monitor platelets by anticoagulation protocol: Yes  Plan:  Patient is now NPO for SBO.  Coumadin being held.  Previous conversation with pharmacist and Dr Sherrie MustacheFisher  Re: Lovenox with dose of 0.75mg /kg q12h for treatment of Afib in elderly patient >78yo.  Continue Lovenox 60mg  sq q12h CBC on MWF  07/15/2014@12 :09 PM  S. Margo AyeHall, PharmD

## 2014-07-15 NOTE — Progress Notes (Signed)
Pt a/o.vss. Iv patent. Telemetry reads afib. O2 on 3L/[place]. Condom catheter intact and draining. NG tube to L Nare, intact and patent. [facility]. Pt is awaiting for carelink to arrive.

## 2014-07-15 NOTE — Progress Notes (Signed)
[facility] and management of small bowel obstruction unresponsive to medical therapy. As requested by Mrs. Bai I did call Dr. Johna SheriffHoxworth. office and left a message for him.

## 2014-07-15 NOTE — Progress Notes (Signed)
[facility] PROGRESS NOTE  Mark Day ZOX:[id number] DOB: 04-14-1923 DOA: 07/01/2014 PCP: Kirstie Peri, MD  Assessment/Plan: Upper GI Bleed -2/2 duodenitis in the setting of anticoagulation. -Continue IV Protonix. -S/p EGD 11/14.  Mild Acute Blood Loss Anemia -hemoglobin has decreased minimally in the setting of equilibration and hemodilution. Continue to monitor CBC. No need for transfusion at present.  Small bowel obstruction -NG tube placed on 11/16. -Continues to have a distended abdomen NG tube is trimmed about 2000 mL. -Does not seem to be responding to conservative management. -Surgeon at [facility] has been curb sided and he believes that given patient's medical comorbidities he is not fit to undergo surgical exploration at [facility] and has recommended transfer to [facility]. -discussed with Barnetta Chapel, PA. They will see patient upon arrival to [facility].  Chronic atrial fibrillation -Chronically maintained on Coumadin for anticoagulation. -Coumadin has been on hold given his acute GI bleed. -He is currently receiving full doses of Lovenox.  Chronic systolic CHF -Currently compensated.  Hypertension -antihypertensives are on hold given his nothing by mouth state. -Continue low-dose Cardizem for BP management.  Urinary retention -Status post Foley catheter placement. -Consider voiding trials once more stable.  Cervical fracture status post fall in September 2015 -Has a neck brace in place.  Code Status: full code Family Communication: discussed with patient's wife  Disposition Plan: transferred to [facility]   Consultants:  GI, Dr. Dionicia Abler   Antibiotics:  none   Subjective: No complaints  Objective: Filed Vitals:   07/15/14 1345 07/15/14 1400 07/15/14 1415 07/15/14 1430  BP: 120/69 118/70 115/67 115/75  Pulse: 106 97 104 107  Temp:      TempSrc:      Resp: 23 27 20 20   Height:      Weight:      SpO2: 96% 95% 97% 96%     Intake/Output Summary (Last 24 hours) at 07/15/14 1455 Last data filed at 07/15/14 1409  Gross per 24 hour  Intake   2415 ml  Output   3750 ml  Net  -1335 ml   Filed Weights   07/13/14 0500 07/14/14 0500 07/15/14 0500  Weight: 76.3 kg (168 lb 3.4 oz) 75.5 kg (166 lb 7.2 oz) 73.9 kg (162 lb 14.7 oz)    Exam:   General:  Alert, awake, oriented  Cardiovascular: regular rate and rhythm  Respiratory: clear to auscultation bilaterally  Abdomen: distended, decreased bowel sounds  Extremities: no clubbing, cyanosis or edema   Neurologic:  nonfocal  Data Reviewed: Basic Metabolic Panel:  Recent Labs Lab 07/10/2014 1443  07/12/14 0714 07/12/14 1220 07/13/14 1757 07/14/14 0417 07/15/14 0518  NA  --   < > 144 144 138 140 136*  K  --   < > 4.1 4.1 3.7 3.8 4.6  CL  --   < > 106 106 99 100 96  CO2  --   < > 29 28 29 30 30   GLUCOSE  --   < > 109* 137* 128* 119* 125*  BUN  --   < > 12 12 8 7 7   CREATININE  --   < > 0.93 0.89 0.90 0.90 0.95  CALCIUM  --   < > 9.4 9.3 9.1 9.0 9.6  MG 1.8  --   --   --   --   --   --   < > = values in this interval not displayed. Liver Function Tests:  Recent Labs Lab 07/17/2014 [phone] 07/10/14 [phone]  AST 23 21  ALT 14 14  ALKPHOS 139* 112  BILITOT 1.7* 1.1  PROT 7.8 6.2  ALBUMIN 3.8 3.0*    Recent Labs Lab 07/15/2014 0939  LIPASE 52    Recent Labs Lab 07/03/2014 1355  AMMONIA 23   CBC:  Recent Labs Lab 07/07/2014 0939  2013-10-03 0919 07/12/14 0714 07/13/14 0441 07/13/14 1757 07/15/14 0518  WBC 9.2  < > 5.1 6.6 7.8 7.4 7.5  NEUTROABS 6.8  --   --   --   --   --   --   HGB 13.8  < > 11.3* 12.6* 12.6* 12.0* 13.4  HCT 41.0  < > 34.7* 38.2* 37.9* 36.0* 39.8  MCV 95.6  < > 99.4 99.2 97.9 96.5 96.6  PLT 228  < > 153 185 204 200 222  < > = values in this interval not displayed. Cardiac Enzymes:  Recent Labs Lab 07/20/2014 0939  TROPONINI <0.30   BNP (last 3 results)  Recent Labs  07/06/2014 0939  PROBNP 3979.0*    CBG:  Recent Labs Lab 07/15/2014 1157 07/20/2014 1336 07/17/2014 1824  GLUCAP 105* 101* 114*    Recent Results (from the past 240 hour(s))  MRSA PCR Screening     Status: None   Collection Time: 07/27/2014  3:15 PM  Result Value Ref Range Status   MRSA by PCR NEGATIVE NEGATIVE Final    Comment:        The GeneXpert MRSA Assay (FDA approved for NASAL specimens only), is one component of a comprehensive MRSA colonization surveillance program. It is not intended to diagnose MRSA infection nor to guide or monitor treatment for MRSA infections.   H.pylori Antigen, Stool     Status: None   Collection Time: 2013-10-03 12:45 PM  Result Value Ref Range Status   Specimen Description STOOL  Final   Special Requests NONE  Final   H. pylori ag, stool   Final    NEGATIVE Antimicrobials,proton pump inhibitors and bismuth preparations are known to suppress H.pylori and ingestion of these prior to testing may cause a false negative result. If a negative result is obtained for a patient that has  ingested these  compounds within two weeks prior of performing the H.pylori test, results may be falsely negative and should be repeated with a new specimen two weeks after discontinuing treatment. Performed at [facility]    Report Status 07/14/2014 FINAL  Final     Studies: Dg Abd Acute W/chest  07/15/2014   CLINICAL DATA:  80 year old male with small bowel obstruction. Initial encounter. Hospitalization last month for cervical and thoracic spine fractures after a fall at home.  EXAM: ACUTE ABDOMEN SERIES (ABDOMEN 2 VIEW & CHEST 1 VIEW)  COMPARISON:  Abdominal series 1610962015/02/04. CT Abdomen and Pelvis 07/06/2014, and earlier  FINDINGS: Upright AP view of the chest. Improved lower lobe ventilation, less collapse or consolidation. Small right pleural effusion. No pneumothorax or pneumoperitoneum. Stable cardiac size and mediastinal contours. Left chest cardiac AICD.  Enteric tube in place, side hole to  level of the distal stomach. Thoracolumbar junction kyphoplasty or vertebroplasty changes again noted.  Interval regressed small bowel dilatation. Gas-filled small bowel now measures up to 36 mm diameter, previously 46 mm. There remains a paucity of colonic gas.  Extensive calcified atherosclerosis including in the abdominal aorta and iliac arteries. No acute osseous abnormality identified.  IMPRESSION: 1. Enteric tube in place, side hole at the level of the distal stomach. 2. Interval decreased distension  of small bowel. Paucity of large bowel gas remain suggesting incompletely resolved small bowel obstruction. 3. No pneumoperitoneum. 4. Improved lower lobe ventilation.  Small right pleural effusion.   Electronically Signed   By: Augusto GambleLee  Hall M.D.   On: 07/15/2014 06:16   Dg Basil Dessaso G Tube Plc W/fl W/rad  07/13/2014   CLINICAL DATA:  Small bowel obstruction, failed attempts at NG tube placement  EXAM: NASO G TUBE PLACEMENT WITH FL AND WITH RAD  FLUOROSCOPY TIME:  30 seconds  COMPARISON:  None  FINDINGS: The patient is in a cervical collar. The examination was explained to the patient. A nasogastric tube was inserted through the left nare. The nasogastric tube was advance into the stomach. The tip of the nasogastric tube there is within the body of the stomach. The nasogastric tube was secured to the patient with tape. The patient tolerated the procedure well.  IMPRESSION: 1. Successful placement of a nasogastric tube under fluoroscopy.   Electronically Signed   By: Elige KoHetal  Patel   On: 07/13/2014 15:54    Scheduled Meds: . sodium chloride   Intravenous Once  . antiseptic oral rinse  7 mL Mouth Rinse BID  . enoxaparin (LOVENOX) injection  60 mg Subcutaneous Q12H  . furosemide  20 mg Intravenous Daily  . lisinopril  10 mg Oral Daily  . ondansetron (ZOFRAN) IV  4 mg Intravenous 4 times per day  . pantoprazole (PROTONIX) IV  40 mg Intravenous Q12H   Continuous Infusions: . dextrose 5 % and 0.45 % NaCl with KCl  40 mEq/L 100 mL/hr at 07/15/14 1405  . diltiazem (CARDIZEM) infusion 5 mg/hr (07/15/14 1405)    Principal Problem:   Duodenitis with bleeding Active Problems:   Atrial fibrillation   AICD (automatic cardioverter/defibrillator) present   Acute on chronic systolic heart failure   Nausea and vomiting   Hematemesis   SBO (small bowel obstruction)   Multiple fractures of thoracic spine   Warfarin-induced coagulopathy   Cholelithiasis    Time spent: 45 minutes. Greater than 50% of this time was spent in direct contact with the patient coordinating care.    Chaya JanHERNANDEZ ACOSTA,ESTELA  [facility] Pager [phone]  If 7PM-7AM, please contact night-coverage at [email or web address], password [email or web address] 07/15/2014, 2:55 PM  LOS: 6 days

## 2014-07-15 NOTE — Progress Notes (Signed)
  Subjective:  Patient has no complaints. He denies nausea or abdominal pain. He has not passed flatus or had a bowel movement last 24 hours.   Objective: Blood pressure 120/66, pulse 104, temperature 97.7 F (36.5 C), temperature source Oral, resp. rate 32, height 6\' 1"  (1.854 m), weight 162 lb 14.7 oz (73.9 kg), SpO2 96 %. Patient is alert and in no acute distress. Conjunctiva is pink. Sclera is nonicteric Oropharyngeal mucosa is normal. No neck masses or thyromegaly noted. Cardiac exam with regular rhythm normal S1 and S2. No murmur or gallop noted. Lungs are clear to auscultation. Abdomen is symmetrical. Bowel sounds are hypoactive. On palpation is soft and nontender without organomegaly or masses. No LE edema or clubbing noted.  He has fine tremors to both hands.  NG return 1250 mL last 24 hours. Urine output 2700 mL last 24 hours  Labs/studies Results:   Recent Labs  07/13/14 0441 07/13/14 1757 07/15/14 0518  WBC 7.8 7.4 7.5  HGB 12.6* 12.0* 13.4  HCT 37.9* 36.0* 39.8  PLT 204 200 222    BMET   Recent Labs  07/13/14 1757 07/14/14 0417 07/15/14 0518  NA 138 140 136*  K 3.7 3.8 4.6  CL 99 100 96  CO2 29 30 30   GLUCOSE 128* 119* 125*  BUN 8 7 7   CREATININE 0.90 0.90 0.95  CALCIUM 9.1 9.0 9.6     Recent Labs  07/14/14 0417 07/15/14 0518  LABPROT 17.4* 17.4*  INR 1.41 1.41    Acute abdominal series reviewed.Multiple dilated loops of small bowel in upper abdomen with air-fluid levels.  Assessment:  #1. Small bowel obstruction has not cleared with almost 2 days of NG suction. Patient's abdominal exam is within normal limits except he has hypoactive bowel sounds. #2. Upper GI bleed and active. Patient is back on Lovenox.  Recommendations;  Continue NG suction. Patient many to be transferred to tertiary center unless he opens up in the next 24 hours. Will discuss with Mrs. Rister when she is here.

## 2014-07-16 ENCOUNTER — Inpatient Hospital Stay (HOSPITAL_COMMUNITY): Payer: Medicare Other

## 2014-07-16 ENCOUNTER — Encounter (HOSPITAL_COMMUNITY): Payer: Self-pay | Admitting: General Surgery

## 2014-07-16 LAB — GLUCOSE, CAPILLARY
GLUCOSE-CAPILLARY: 111 mg/dL — AB (ref 70–99)
Glucose-Capillary: 116 mg/dL — ABNORMAL HIGH (ref 70–99)
Glucose-Capillary: 125 mg/dL — ABNORMAL HIGH (ref 70–99)
Glucose-Capillary: 127 mg/dL — ABNORMAL HIGH (ref 70–99)

## 2014-07-16 MED ORDER — INSULIN ASPART 100 UNIT/ML ~~LOC~~ SOLN
0.0000 [IU] | SUBCUTANEOUS | Status: DC
  Administered 2014-07-17 (×3): 1 [IU] via SUBCUTANEOUS

## 2014-07-16 MED ORDER — FAT EMULSION 20 % IV EMUL
240.0000 mL | INTRAVENOUS | Status: AC
  Administered 2014-07-16: 240 mL via INTRAVENOUS
  Filled 2014-07-16: qty 250

## 2014-07-16 MED ORDER — SODIUM CHLORIDE 0.9 % IJ SOLN
10.0000 mL | INTRAMUSCULAR | Status: DC | PRN
  Administered 2014-07-16: 20 mL
  Administered 2014-07-17 – 2014-07-19 (×3): 10 mL
  Filled 2014-07-16 (×4): qty 40

## 2014-07-16 MED ORDER — SODIUM CHLORIDE 0.45 % IV SOLN
INTRAVENOUS | Status: DC
  Administered 2014-07-16 – 2014-07-17 (×2): via INTRAVENOUS

## 2014-07-16 MED ORDER — IOHEXOL 300 MG/ML  SOLN
150.0000 mL | Freq: Once | INTRAMUSCULAR | Status: AC | PRN
  Administered 2014-07-16: 150 mL via ORAL

## 2014-07-16 MED ORDER — TRACE MINERALS CR-CU-F-FE-I-MN-MO-SE-ZN IV SOLN
INTRAVENOUS | Status: AC
  Administered 2014-07-16: 20:00:00 via INTRAVENOUS
  Filled 2014-07-16: qty 1000

## 2014-07-16 MED ORDER — METOPROLOL TARTRATE 1 MG/ML IV SOLN: 2.5000 mg | Freq: Three times a day (TID) | INTRAVENOUS | Status: DC

## 2014-07-16 MED ORDER — IOHEXOL 300 MG/ML  SOLN
150.0000 mL | Freq: Once | INTRAMUSCULAR | Status: AC | PRN
  Administered 2014-07-16: 1 mL via ORAL

## 2014-07-16 MED ORDER — ACETAMINOPHEN 650 MG RE SUPP: 650.0000 mg | RECTAL | Status: DC | PRN

## 2014-07-16 NOTE — Progress Notes (Signed)
Peripherally Inserted Central Catheter/Midline Placement  The IV Nurse has discussed with the patient and/or persons authorized to consent for the patient, the purpose of this procedure and the potential benefits and risks involved with this procedure.  The benefits include less needle sticks, lab draws from the catheter and patient may be discharged home with the catheter.  Risks include, but not limited to, infection, bleeding, blood clot (thrombus formation), and puncture of an artery; nerve damage and irregular heat beat.  Alternatives to this procedure were also discussed.  PICC/Midline Placement Documentation        Lisabeth DevoidGibbs, Brianny Soulliere Jeanette 07/16/2014, 4:15 PM Consent obtained by Merleen Millinerhea Duncan, RN, CRNI from wife at bedside

## 2014-07-16 NOTE — Plan of Care (Signed)
Problem: Phase I Progression Outcomes Goal: Pain controlled with appropriate interventions Outcome: Progressing Goal: OOB as tolerated unless otherwise ordered Outcome: Not Progressing Pt is on bedrest currently. Goal: Voiding-avoid urinary catheter unless indicated Outcome: Progressing Goal: Hemodynamically stable Outcome: Progressing Goal: Other Phase I Outcomes/Goals Outcome: Progressing

## 2014-07-16 NOTE — Progress Notes (Signed)
UR completed Lulubelle Simcoe K. Petrina Melby, RN, BSN, MSHL, CCM  07/16/2014 3:44 PM

## 2014-07-16 NOTE — Progress Notes (Addendum)
PARENTERAL NUTRITION CONSULT NOTE - INITIAL  Pharmacy Consult for TPN Indication: SBO  No Known Allergies  Patient Measurements: Height: 6' 1"  (185.4 cm) Weight: 147 lb 0.8 oz (66.7 kg) IBW/kg (Calculated) : 79.9   Vital Signs: Temp: 97.6 F (36.4 C) (11/19 0548) Temp Source: Oral (11/19 0548) BP: 113/81 mmHg (11/19 0548) Pulse Rate: 94 (11/19 0548) Intake/Output from previous day: 11/18 0701 - 11/19 0700 In: 1365 [I.V.:1365] Out: 0814 [Urine:2375; Emesis/NG output:1100] Intake/Output from this shift:    Labs:  Recent Labs  07/13/14 1757 07/14/14 0417 07/15/14 0518  WBC 7.4  --  7.5  HGB 12.0*  --  13.4  HCT 36.0*  --  39.8  PLT 200  --  222  INR  --  1.41 1.41     Recent Labs  07/13/14 1757 07/14/14 0417 07/15/14 0518  NA 138 140 136*  K 3.7 3.8 4.6  CL 99 100 96  CO2 29 30 30   GLUCOSE 128* 119* 125*  BUN 8 7 7   CREATININE 0.90 0.90 0.95  CALCIUM 9.1 9.0 9.6   Estimated Creatinine Clearance: 49.7 mL/min (by C-G formula based on Cr of 0.95).    Recent Labs  07/16/14 0413 07/16/14 0755 07/16/14 1209  GLUCAP 116* 111* 127*    Insulin Requirements in the past 24 hours:  None ordered  Current Nutrition:  NPO  Nutritional Goals:  1900-2100 kCal, 90-100 grams of protein per day per RD recommendations 11/17  Admit: [name] transferred from [facility]- originally brought in for GIB and after the bleeding stopped, he was unable to pass flatus or BM and an SBO was noted on CT and XR. Too high risk for surgery at [facility] and transferred here. Repeating films  GI: NGT placed on 11/16 per records, 1822m out since yesterday morning. Was at [facility] for ~1 week and was not on nutrition during that time. On IV PPI and schedule Zofran currently.  Endo: CBGs 111-127  Lytes: last labs from yesterday- Na 136, K 4.6, Ca 9.6 (last albumin was 3, does not need a lot of correction); last mag level from 11/12- 1.8. No recent phos  Renal: SCr 0.95, est CrCl  ~518mmin  Pulm: 98/3L [place]  Cards: hx AFib and systolic HF; warfarin is being held- continue to hold- on full dose Lovenox. BP soft-nml, HR nml-tachy; lisinopril and lasix  Hepatobil: last LFTs from 11/13- AST, ALT, alk phos and Tbili all normal, albumin low at 3  Neuro: s/p fall  ID: No acute issues  Best Practices: MC, Lovenox  TPN Access: to get PICC placed today- spoke with IV team TPN day#: 0- starting 11/19 at 1800  Plan:  1. Start Clinimix E 5/15 at 4057mr + 20% fat emulsion at 63m41m- this will provide 48g protein and 1162kcal. Goal rate will be 83mL18m+ fats at 63mL/7mthis will provide 96g protein and 1894kcal 2. Multivitamin and trace elements in TPN 3. Start CBGs and sensitive SSI q4h 4. Change MIVF to 1/2NS at 60mL/h22marting at 1800 tonight with start of TPN 5. TPN labs as ordered- full set tomorrow 6. Follow for RD recommendations and signs of refeeding  Mark Day, PharmD, BCPS Clinical Pharmacist Pager: [phone] 1:50 PM

## 2014-07-16 NOTE — Consult Note (Signed)
CARDIOLOGY CONSULT NOTE   Patient ID: Mark Day MRN: [id number], DOB/AGE: 78/01/1923   Admit date: 07/02/2014 Date of Consult: 07/16/2014   Primary Physician: Kirstie PeriSHAH,ASHISH, MD Primary Cardiologist: Dr. Lewayne BuntingGregg Taylor  Pt. Profile  80 year old gentleman transferred from [facility] with suspected small bowel obstruction.  Preoperative cardiac evaluation has been requested.  Problem List  Past Medical History  Diagnosis Date  . Osteoarthritis   . Chronic diarrhea   . Hypertension   . Fracture of vertebra 10/2013    Lumbar  . Epidural hematoma 04/2014     subarachnoid hemorrhage status post fall-[facility]  . Chronic systolic heart failure 04/2014    at [facility]-ejection fraction 45-50%; severe ulnar hypertension  . AICD (automatic cardioverter/defibrillator) present   . Atrial fibrillation     On coumadin  . Multiple fractures of thoracic spine 04/2014    [facility]  . Cervical spine fracture 04/2014    [facility]  . Cholelithiasis 07/15/2014  . Tremor of both hands   . Chronic anticoagulation     With warfarin  . Pulmonary hypertension 04/2014    [facility]  . Atrial dilatation, bilateral 04/2014    [facility]; severe  . Duodenitis with bleeding Jul 23, 2014    Past Surgical History  Procedure Laterality Date  . Prostatectomy    . Colonoscopy    . Neck surgery    . Esophagogastroduodenoscopy N/A Jul 23, 2014    Procedure: ESOPHAGOGASTRODUODENOSCOPY (EGD);  Surgeon: Malissa HippoNajeeb U Rehman, MD;  Location: [facility];  Service: Endoscopy;  Laterality: N/A;     Allergies  No Known Allergies  HPI   This 80 year old gentleman has a past history of long-standing cardiomyopathy and has an ICD implant.  He is followed by Dr. Lewayne BuntingGregg Taylor.  He has a Medtronic single-chamber ICD working normally and it was last checked by Dr. Ladona Ridgelaylor on 06/24/14  [facility]  [facility] most recent  [facility] in [facility] 2015 and demonstrated an ejection fraction of 45-50%.  The patient has a history of left atrial enlargement by echo and has a history of chronic atrial fibrillation.  He has remained on Coumadin.  He has had some recent falls.  He broke his neck in 1 fall and is wearing an extensive cervical collar.  He has recently been hospitalized at [facility] for the past week but prior to that was in an rehabilitation facility for his neck injury.  He also suffered an epidural hematoma.  He also has thoracic spine fractures.  According to his available records he had a recent admission for GI bleed from duodenitis and had upper endoscopy done at [facility].  He subsequently was unable to pass flatus or have a bowel movement and was noted to have developed a small bowel obstruction.  Surgery at [facility] was felt to be too high risk and [facility] was [facility]. From a cardiac standpoint the patient has not been experiencing any chest pain or angina.  He has not had any recent symptoms to suggest worsening congestive heart failure.  He is able to lie flat without dyspnea.  Inpatient Medications  . sodium chloride   Intravenous Once  . antiseptic oral rinse  7 mL Mouth Rinse BID  . enoxaparin (LOVENOX) injection  60 mg Subcutaneous Q12H  . insulin aspart  0-9 Units Subcutaneous 6 times  per day  . ondansetron (ZOFRAN) IV  4 mg Intravenous 4 times per day  . pantoprazole (PROTONIX) IV  40 mg Intravenous Q12H    Family History Family History  Problem Relation Age of Onset  . Healthy Brother   . Healthy Son   . Healthy Son      Social History History   Social History  . Marital Status: Married    Spouse Name: N/A    Number of Children: N/A  . Years of Education: N/A   Occupational History  . Not on file.   Social History Main Topics  . Smoking status: Never Smoker   . Smokeless tobacco: Never Used    . Alcohol Use: Yes     Comment: Patient states that he drinks a bottle of beer every night.  . Drug Use: No  . Sexual Activity: Not on file   Other Topics Concern  . Not on file   Social History Narrative     Review of Systems  General:  No chills, fever, night sweats or weight changes.  Cardiovascular:  No chest pain, dyspnea on exertion, edema, orthopnea, palpitations, paroxysmal nocturnal dyspnea. Dermatological: No rash, lesions/masses Respiratory: No cough, dyspnea Urologic: No hematuria, dysuria Abdominal:   Recent inability to pass stool or flatus secondary to small bowel obstruction Neurologic:  No visual changes, wkns, changes in mental status. All other systems reviewed and are otherwise negative except as noted above.  Physical Exam  Blood pressure 105/64, pulse 104, temperature 98.7 F (37.1 C), temperature source Oral, resp. rate 16, height 6\' 1"  (1.854 m), weight 147 lb 0.8 oz (66.7 kg), SpO2 100 %.  General: Pleasant, NAD.  Seen in x-ray.  Lying flat comfortably Psych: Normal affect. Neuro: Alert and oriented X 3. Moves all extremities spontaneously. HEENT: Normal.  His head and neck are immobilized in an extensive brace  Neck: Supple without bruits or JVD. Lungs:  Resp regular and unlabored, CTA. Heart: Irregularly irregular rhythm.  No murmur gallop or rub Abdomen: Soft, non-tender, non-distended, bowel sounds are hypoactive Extremities: No clubbing, cyanosis or edema. DP/PT/Radials 2+ and equal bilaterally.  Labs  No results for input(s): CKTOTAL, CKMB, TROPONINI in the last 72 hours. Lab Results  Component Value Date   WBC 7.5 07/15/2014   HGB 13.4 07/15/2014   HCT 39.8 07/15/2014   MCV 96.6 07/15/2014   PLT 222 07/15/2014    Recent Labs Lab 07/10/14 0219  07/15/14 0518  NA 140  < > 136*  K 3.8  < > 4.6  CL 103  < > 96  CO2 27  < > 30  BUN 24*  < > 7  CREATININE 1.02  < > 0.95  CALCIUM 9.1  < > 9.6  PROT 6.2  --   --   BILITOT 1.1  --    --   ALKPHOS 112  --   --   ALT 14  --   --   AST 21  --   --   GLUCOSE 109*  < > 125*  < > = values in this interval not displayed. No results found for: CHOL, HDL, LDLCALC, TRIG No results found for: DDIMER  Radiology/Studies  Ct Head Wo Contrast  07/01/2014   CLINICAL DATA:  Nausea and vomiting since yesterday, with large volume hematemesis this morning, initial evaluation  EXAM: CT HEAD WITHOUT CONTRAST  TECHNIQUE: Contiguous axial images were obtained from the base of the skull through the vertex without intravenous contrast.  COMPARISON:  04/21/2014  FINDINGS: Severe atrophy. Moderate low attenuation in the deep white matter. No evidence of acute infarct. No hemorrhage or extra-axial fluid. No evidence of mass or hydrocephalus. The calvarium is intact. There is an air-fluid level in the left sphenoid sinus. This was not present on the prior study.  IMPRESSION: Significant inflammatory change left sphenoid sinus suggesting acute sinusitis. No acute findings otherwise with significant chronic age-related involutional change.   Electronically Signed   By: Esperanza Heir M.D.   On: 07/23/2014 11:33   Nm Pulmonary Perfusion  07/14/2014   CLINICAL DATA:  Shortness of breath at nursing home today, history hypertension, atrial fibrillation, chronic systolic heart failure  EXAM: NUCLEAR MEDICINE PERFUSION SCAN  TECHNIQUE: Perfusion images were obtained in multiple projections after intravenous injection of radiopharmaceutical. Patient was unable to cooperate and perform of an adequate ventilation exam.  RADIOPHARMACEUTICALS:  6 mCi Tc36m MAA  COMPARISON:  None; correlation chest radiograph 07/22/2014 and CT abdomen/pelvis 06/28/2014  FINDINGS: Enlargement of cardiac silhouette.  Blunting of perfusion at the posterior RIGHT lower lobe secondary to pleural effusion.  No segmental or subsegmental perfusion defects identified.  IMPRESSION: Enlargement of cardiac silhouette and RIGHT pleural effusion.  No  other segmental or subsegmental perfusion defects.  Findings represent a very low probability for pulmonary embolism.   Electronically Signed   By: Ulyses Southward M.D.   On: 07/03/2014 16:59   Ct Abdomen Pelvis W Contrast  07/01/2014   CLINICAL DATA:  Hematemesis  EXAM: CT ABDOMEN AND PELVIS WITH CONTRAST  TECHNIQUE: Multidetector CT imaging of the abdomen and pelvis was performed using the standard protocol following bolus administration of intravenous contrast.  CONTRAST:  25mL OMNIPAQUE IOHEXOL 300 MG/ML SOLN, OMNIPAQUE IOHEXOL 300 MG/ML SOLN  COMPARISON:  07/24/2012  FINDINGS: Lung bases demonstrate bilateral basilar atelectasis. Additionally there are changes highly suggestive of bilateral pulmonary emboli although timing was not performed for pulmonary embolism evaluation. Heavy coronary calcifications are seen.  The gallbladder is well distended and demonstrates multiple small gallstones. No pericholecystic fluid is seen. The liver, spleen, adrenal glands and pancreas are within normal limits. The kidneys are well visualized bilaterally and demonstrate renal cystic change without obstructive change.  Multiple dilated loops of small bowel are identified with a transition point low in the left hemipelvis. No definitive mass lesion is noted in this region. The more distal small bowel in the ileum is within normal limits. The appendix is not well visualized although no inflammatory changes to suggest appendicitis are noted. Diffuse diverticular change is noted within the colon. No evidence of diverticulitis is seen. The bladder is partially distended. Prostatic calcifications are noted. No pelvic lymphadenopathy is seen. Diffuse aortoiliac calcifications are noted without aneurysmal dilatation. Chronic compression deformities at T12 and L1 are noted with changes of prior vertebral augmentation. Schmorl's node is noted in the superior endplate of L3.  IMPRESSION: Changes consistent with small bowel  obstruction in the distal jejunum/proximal ileum. No definitive mass lesion is noted. These changes may be related to prior surgery with adhesions.  Changes highly suggestive of bilateral pulmonary emboli. CTA of the chest is recommended for further evaluation.  Multiple gallstones without complicating factors.  Chronic renal cystic change.  Diverticulosis without diverticulitis.   Electronically Signed   By: Alcide Clever M.D.   On: 07/04/2014 11:42   Dg Chest Port 1 View  07/16/2014   CLINICAL DATA:  Right-sided PICC line insertion  EXAM: PORTABLE CHEST - 1 VIEW  COMPARISON:  Radiograph 07/15/2014  FINDINGS: Interval placement of right PICC line with tip in the distal SVC. NG tube unchanged. Defibrillator overlies normal cardiac silhouette. No effusion, infiltrate, or pneumothorax. Left basilar atelectasis.  IMPRESSION: Right PICC line in good position   Electronically Signed   By: Genevive BiStewart  Edmunds M.D.   On: 07/16/2014 16:54   Dg Abd 2 Views  07/16/2014   CLINICAL DATA:  Evaluate NG tube placement and followup small bowel obstruction  EXAM: ABDOMEN - 2 VIEW  COMPARISON:  07/15/2014  FINDINGS: The NG tube tip is in the distal stomach or proximal duodenum. Dilated loops of small bowel are again identified and appear slightly improved from previous exam. No new findings identified.  IMPRESSION: 1. Mild improvement in small bowel dilatation. 2. Satisfactory position of nasogastric tube.   Electronically Signed   By: Signa Kellaylor  Stroud M.D.   On: 07/16/2014 09:55   Dg Abd Acute W/chest  07/15/2014   CLINICAL DATA:  80 year old male with small bowel obstruction. Initial encounter. Hospitalization last month for cervical and thoracic spine fractures after a fall at home.  EXAM: ACUTE ABDOMEN SERIES (ABDOMEN 2 VIEW & CHEST 1 VIEW)  COMPARISON:  Abdominal series 16109611/16/2015. CT Abdomen and Pelvis 07/13/2014, and earlier  FINDINGS: Upright AP view of the chest. Improved lower lobe ventilation, less collapse or  consolidation. Small right pleural effusion. No pneumothorax or pneumoperitoneum. Stable cardiac size and mediastinal contours. Left chest cardiac AICD.  Enteric tube in place, side hole to level of the distal stomach. Thoracolumbar junction kyphoplasty or vertebroplasty changes again noted.  Interval regressed small bowel dilatation. Gas-filled small bowel now measures up to 36 mm diameter, previously 46 mm. There remains a paucity of colonic gas.  Extensive calcified atherosclerosis including in the abdominal aorta and iliac arteries. No acute osseous abnormality identified.  IMPRESSION: 1. Enteric tube in place, side hole at the level of the distal stomach. 2. Interval decreased distension of small bowel. Paucity of large bowel gas remain suggesting incompletely resolved small bowel obstruction. 3. No pneumoperitoneum. 4. Improved lower lobe ventilation.  Small right pleural effusion.   Electronically Signed   By: Augusto GambleLee  Hall M.D.   On: 07/15/2014 06:16   Dg Abd Acute W/chest  07/12/2014   CLINICAL DATA:  Small bowel obstruction.  Subsequent encounter.  EXAM: ACUTE ABDOMEN SERIES (ABDOMEN 2 VIEW & CHEST 1 VIEW)  COMPARISON:  07/06/2014.  FINDINGS: There is no interval change in the chest with cardiomegaly, interstitial pulmonary edema and small bilateral pleural effusions compatible with mild to moderate CHF. Enlargement of vascular pedicle. LEFT subclavian AICD.  No free air underneath the hemidiaphragms. Gaseous distention of the stomach is present. There is no nasogastric tube present. Rectum is not visible on these radiographs. Small bowel loops are dilated, maximally measuring 46 mm. Monitoring leads project over the abdomen. Atherosclerosis. Upper lumbar vertebral augmentation.  IMPRESSION: 1. Dilated small bowel loops in the central abdomen, compatible with small bowel obstruction or ileus. 2. Mild to moderate CHF.   Electronically Signed   By: Andreas NewportGeoffrey  Lamke M.D.   On: 07/12/2014 12:52   Dg Abd Acute  W/chest  07/18/2014   CLINICAL DATA:  Nausea and vomiting  EXAM: ACUTE ABDOMEN SERIES (ABDOMEN 2 VIEW & CHEST 1 VIEW)  COMPARISON:  06/29/2014  FINDINGS: Dilated small bowel is again identified but appears improved when compared with the prior exam. No free air is noted. No abnormal mass is noted. Vertebral augmentation is noted.  The cardiac shadow is not remains enlarged. A defibrillator  is again seen. Patchy bibasilar atelectasis is noted. No infiltrate or pneumothorax is seen. Aortic calcifications are again noted.  IMPRESSION: Improving small bowel obstructive change.  Bibasilar atelectatic changes are seen.   Electronically Signed   By: Alcide Clever M.D.   On: 07/16/2014 07:33   Dg Abd Acute W/chest  26-Jul-2014   CLINICAL DATA:  Hematemesis  EXAM: ACUTE ABDOMEN SERIES (ABDOMEN 2 VIEW & CHEST 1 VIEW)  COMPARISON:  03/13/2014  FINDINGS: There is chronic moderate cardiomegaly. Aortic and hilar contours are unchanged given lower lung volumes. There is mild atelectasis at the bases. No evidence for edema or pneumonia.  Moderate distention of the stomach by fluid and gas. No evidence of small bowel or colonic obstruction. No visible pneumoperitoneum.  IMPRESSION: 1. Moderate distension of the stomach by gas and fluid. 2. No pneumoperitoneum. 3. Shallow inspiration with bibasilar atelectasis.   Electronically Signed   By: Tiburcio Pea M.D.   On: 2014-07-26 10:36   Dg Basil Dess Tube Plc W/fl W/rad  07/13/2014   CLINICAL DATA:  Small bowel obstruction, failed attempts at NG tube placement  EXAM: NASO G TUBE PLACEMENT WITH FL AND WITH RAD  FLUOROSCOPY TIME:  30 seconds  COMPARISON:  None  FINDINGS: The patient is in a cervical collar. The examination was explained to the patient. A nasogastric tube was inserted through the left nare. The nasogastric tube was advance into the stomach. The tip of the nasogastric tube there is within the body of the stomach. The nasogastric tube was secured to the patient with  tape. The patient tolerated the procedure well.  IMPRESSION: 1. Successful placement of a nasogastric tube under fluoroscopy.   Electronically Signed   By: Elige Ko   On: 07/13/2014 15:54    ECG  Atrial fibrillation Ventricular premature complex Aberrant conduction of SV complex(es) Nonspecific intraventricular conduction delay Low voltage, precordial leads Consider anterior infarct Borderline repolarization abnormality lateral ST depression No previous ECGs available Confirmed by Manus Gunning MD, STEPHEN [phone]) on 26-Jul-2014 9:41:50 AM  We will update EKG.  ASSESSMENT AND PLAN   1.  Suspected small bowel obstruction. 2.  Chronic atrial fibrillation.  Warfarin on hold. 3.  Past history of cardiomyopathy.  ICD in place.  (Medtronic) 4.  Mild left ventricular systolic dysfunction with recent echocardiogram showing ejection fraction 45-50%  Recommendation: The patient is frail.  He will benefit from TPN as planned by surgery.  From a cardiac standpoint he appears to be stable.  There is no evidence of congestive heart failure.  He is not having any evidence of ischemia clinically.  From  cardiac standpoint he would be cleared for surgery although at higher than average risk because of his age and frailty.  Rate control with IV diltiazem as necessary while unable to take his home medications.  Signed, Cassell Clement, MD  07/16/2014, 6:22 PM

## 2014-07-16 NOTE — Progress Notes (Addendum)
[facility] Progress Note   Mark Day YQM:[id number] DOB: 02/05/1923 DOA: 07/22/2014 PCP: Kirstie PeriSHAH,ASHISH, MD  Brief narrative: Mark Doverhomas Ard is a 80 y.o. male with atrial fibrillation on Coumadin, chronic systolic heart failure, AICD and hypertension who had a recent hospitalization at [facility] from September to October 2015 with treatment for cervical and thoracic fractures and epidural hematoma following a fall at home. He presented to [facility] on 07/04/2014 with nausea vomiting and possible hematemesis. He was begun on treatment with Augmentin by Dr. Leanord Hawkingobson at [facility] for possible diverticulitis. In the ER abdominal CT scan revealed  small bowel obstruction in distal jejunum/proximal ileum.    Subjective: Patient has no complaints today. Wife is at bedside and has multiple questions which I have answered.  Assessment/Plan: Principal Problem:   Duodenitis with bleeding -Occurring in setting of anticoagulation and now resolved -S/p EGD 11/14.  Active Problems:    Nausea and vomiting/ SBO  -Continue conservative management for now-surgery declined to evaluate him at [facility] due to significant comorbidities-[facility] [place] [facility] has evaluated him today and requested that we obtain a cardiology eval for clearance for surgery -Abdomen is not distended today - NG tube draining  -Surgery plans on starting TNA and has requested a PICC line be placed    Multiple fractures of cervical and thoracic spine - Continue brace -We'll eventually need to return to rehabilitation once stable    Warfarin-induced coagulopathy -Warfarin on hold now as surgery may be necessary- Lovenox bridge    Atrial fibrillation - on Cardizem infusion- rate controlled -Coumadin on hold with Lovenox bridge   AICD (automatic cardioverter/defibrillator) present  Chronic systolic heart failure - Compensated-DC furosemide- monitor fluid status closely  Hypertension -BP  controlled on Cardizem infusion for now   Code Status: Full code Family Communication: Wife at bedside Disposition Plan: SNF upon discharge DVT prophylaxis: Full dose Lovenox  Consultants: Surgery GI  Antibiotics: Anti-infectives    None      Objective: Filed Weights   07/15/14 0500 07/15/14 1924 07/16/14 0548  Weight: 73.9 kg (162 lb 14.7 oz) 66.9 kg (147 lb 7.8 oz) 66.7 kg (147 lb 0.8 oz)    Intake/Output Summary (Last 24 hours) at 07/16/14 1251 Last data filed at 07/16/14 0700  Gross per 24 hour  Intake   1365 ml  Output   1825 ml  Net   -460 ml     Vitals Filed Vitals:   07/15/14 1800 07/15/14 1924 07/16/14 0231 07/16/14 0548  BP: 115/66 111/65 114/70 113/81  Pulse: 106 101 93 94  Temp:  98.2 F (36.8 C) 98.6 F (37 C) 97.6 F (36.4 C)  TempSrc:  Oral Oral Oral  Resp: 15 16 18 18   Height:  6\' 1"  (1.854 m)    Weight:  66.9 kg (147 lb 7.8 oz)  66.7 kg (147 lb 0.8 oz)  SpO2: 97% 98% 96% 98%    Exam: General: No acute respiratory distress- neck brace in place-alert and oriented 3 Lungs: Clear to auscultation bilaterally without wheezes or crackles Cardiovascular: Regular rate and rhythm without murmur gallop or rub normal S1 and S2 Abdomen: Nontender, nondistended, soft, bowel sounds positive, no rebound, no ascites, no appreciable mass Extremities: No significant cyanosis, clubbing, or edema bilateral lower extremities  Data Reviewed: Basic Metabolic Panel:  Recent Labs Lab 07/04/2014 1443  07/12/14 0714 07/12/14 1220 07/13/14 1757 07/14/14 0417 07/15/14 0518  NA  --   < > 144 144 138 140 136*  K  --   < >  4.1 4.1 3.7 3.8 4.6  CL  --   < > 106 106 99 100 96  CO2  --   < > 29 28 29 30 30   GLUCOSE  --   < > 109* 137* 128* 119* 125*  BUN  --   < > 12 12 8 7 7   CREATININE  --   < > 0.93 0.89 0.90 0.90 0.95  CALCIUM  --   < > 9.4 9.3 9.1 9.0 9.6  MG 1.8  --   --   --   --   --   --   < > = values in this interval not displayed. Liver Function  Tests:  Recent Labs Lab 07/10/14 0219  AST 21  ALT 14  ALKPHOS 112  BILITOT 1.1  PROT 6.2  ALBUMIN 3.0*   No results for input(s): LIPASE, AMYLASE in the last 168 hours.  Recent Labs Lab 07/08/2014 1355  AMMONIA 23   CBC:  Recent Labs Lab 07/02/2014 0919 07/12/14 0714 07/13/14 0441 07/13/14 1757 07/15/14 0518  WBC 5.1 6.6 7.8 7.4 7.5  HGB 11.3* 12.6* 12.6* 12.0* 13.4  HCT 34.7* 38.2* 37.9* 36.0* 39.8  MCV 99.4 99.2 97.9 96.5 96.6  PLT 153 185 204 200 222   Cardiac Enzymes: No results for input(s): CKTOTAL, CKMB, CKMBINDEX, TROPONINI in the last 168 hours. BNP (last 3 results)  Recent Labs  07/23/2014 0939  PROBNP 3979.0*   CBG:  Recent Labs Lab 07/15/14 2043 07/16/14 0050 07/16/14 0413 07/16/14 0755 07/16/14 1209  GLUCAP 126* 125* 116* 111* 127*    Recent Results (from the past 240 hour(s))  MRSA PCR Screening     Status: None   Collection Time: 07/03/2014  3:15 PM  Result Value Ref Range Status   MRSA by PCR NEGATIVE NEGATIVE Final    Comment:        The GeneXpert MRSA Assay (FDA approved for NASAL specimens only), is one component of a comprehensive MRSA colonization surveillance program. It is not intended to diagnose MRSA infection nor to guide or monitor treatment for MRSA infections.   H.pylori Antigen, Stool     Status: None   Collection Time: 06/30/2014 12:45 PM  Result Value Ref Range Status   Specimen Description STOOL  Final   Special Requests NONE  Final   H. pylori ag, stool   Final    NEGATIVE Antimicrobials,proton pump inhibitors and bismuth preparations are known to suppress H.pylori and ingestion of these prior to testing may cause a false negative result. If a negative result is obtained for a patient that has  ingested these  compounds within two weeks prior of performing the H.pylori test, results may be falsely negative and should be repeated with a new specimen two weeks after discontinuing treatment. Performed at [facility]    Report Status 07/14/2014 FINAL  Final     Studies:  Recent x-ray studies have been reviewed in detail by the Attending Physician  Scheduled Meds:  Scheduled Meds: . sodium chloride   Intravenous Once  . antiseptic oral rinse  7 mL Mouth Rinse BID  . enoxaparin (LOVENOX) injection  60 mg Subcutaneous Q12H  . furosemide  20 mg Intravenous Daily  . lisinopril  10 mg Oral Daily  . ondansetron (ZOFRAN) IV  4 mg Intravenous 4 times per day  . pantoprazole (PROTONIX) IV  40 mg Intravenous Q12H   Continuous Infusions: . dextrose 5 % and 0.45 % NaCl with KCl 40 mEq/L  100 mL/hr at 07/15/14 2303  . diltiazem (CARDIZEM) infusion 5 mg/hr (07/15/14 1700)    Time spent on care of this patient: 35 min   Sarvesh Meddaugh, MD 07/16/2014, 12:51 PM  LOS: 7 days   [facility] Office  [phone] Pager - Text Page per [email or web address]  If 7PM-7AM, please contact night-coverage [email or web address]

## 2014-07-16 NOTE — Consult Note (Signed)
Mark Day [birth year]  [id number].   Requesting MD: Dr. Rande Lawman Chief Complaint/Reason for Consult: SBO HPI: This is an 80 yo white male with multiple medical problems including recent fall with cervical and thoracic spine fractures with epidural hematoma.  He also has a hx of chronic a fib and systolic heart failure.  The patient is not a good historian and most of the history is obtained from the chart.  Apparently, before going to [facility], he had some abdominal pain with no flatus or BM and some N/V.  He states this was nonbloody, but his admission was due to GI bleed from duodenitis per EGD done at [facility].  His coumadin was held.  After the bleeding stopped, he continued to not be able to pass flatus or have a BM.  He was noted to have a SBO on CT and abd xrays.  He was felt to be too high risk for surgery at [facility] if he needed it.  Therefore, he was transferred here for [place] to evaluate and help manage.  ROS: Please see HPI, otherwise minimal abdominal discomfort and no n/v with NGT in place.  C/o back pain from recent t-spine fx.  Otherwise negative ROS  Family History  Problem Relation Age of Onset  . Healthy Brother   . Healthy Son   . Healthy Son     Past Medical History  Diagnosis Date  . Osteoarthritis   . Chronic diarrhea   . Hypertension   . Fracture of vertebra 10/2013    Lumbar  . Epidural hematoma 04/2014     subarachnoid hemorrhage status post fall-Tx at [facility]  . Chronic systolic heart failure 09/5051    at [facility]-ejection fraction 45-50%; severe ulnar hypertension  . AICD (automatic cardioverter/defibrillator) present   . Atrial fibrillation     On coumadin  . Multiple fractures of thoracic spine 04/2014    S/p tx at [facility]  . Cervical spine fracture 04/2014    Tx at [facility]  . Cholelithiasis 07/10/2014  . Tremor of both hands   . Chronic anticoagulation     With warfarin  . Pulmonary hypertension 04/2014    per echo at [facility]  . Atrial dilatation,  bilateral 04/2014    per echo at [facility]; severe  . Duodenitis with bleeding 07/27/2014    Past Surgical History  Procedure Laterality Date  . Prostatectomy    . Colonoscopy    . Neck surgery    . Esophagogastroduodenoscopy N/A 06/28/2014    Procedure: ESOPHAGOGASTRODUODENOSCOPY (EGD);  Surgeon: Rogene Houston, MD;  Location: [facility] ENDO SUITE;  Service: Endoscopy;  Laterality: N/A;    Social History:  reports that he has never smoked. He has never used smokeless tobacco. He reports that he drinks alcohol. He reports that he does not use illicit drugs.  Allergies: No Known Allergies  Medications Prior to Admission  Medication Sig Dispense Refill  . ALPRAZolam (XANAX) 0.25 MG tablet Take 0.125 mg by mouth every 6 (six) hours as needed for anxiety.     Marland Kitchen amoxicillin-clavulanate (AUGMENTIN) 875-125 MG per tablet Take 1 tablet by mouth 2 (two) times daily. Starting 07/06/2014 and ending 07/10/2014.    Marland Kitchen aspirin 81 MG tablet Take 81 mg by mouth daily.    Marland Kitchen atorvastatin (LIPITOR) 10 MG tablet Take 10 mg by mouth daily.    Marland Kitchen escitalopram (LEXAPRO) 10 MG tablet Take 10 mg by mouth daily.    Marland Kitchen lisinopril (PRINIVIL,ZESTRIL) 10 MG tablet Take 10 mg by mouth daily.    Marland Kitchen  metoprolol (LOPRESSOR) 50 MG tablet Take 50 mg by mouth 2 (two) times daily.    Marland Kitchen warfarin (COUMADIN) 6 MG tablet Take 6 mg by mouth daily.    Marland Kitchen docusate sodium (COLACE) 100 MG capsule Take 200 mg by mouth daily.    Marland Kitchen warfarin (COUMADIN) 4 MG tablet Take 4 mg by mouth every other day. Alternate between 37m and 523m   . warfarin (COUMADIN) 5 MG tablet Take 5 mg by mouth every other day. Alternate between 3m81mnd 5mg72m   Blood pressure 113/81, pulse 94, temperature 97.6 F (36.4 C), temperature source Oral, resp. rate 18, height 6' 1"  (1.854 m), weight 147 lb 0.8 oz (66.7 kg), SpO2 98 %. Physical Exam: General: pleasant, elderly appearing white male who is laying in bed in NAD HEENT: head is normocephalic, atraumatic.  Sclera are  noninjected.  PERRL.  Ears and nose without any masses or lesions.  NGT is in place with bilious output.  Mouth is pink  Neck: large [place] JTO brace in place Heart: irregularly irregular.  Normal s1,s2. + murmur No obvious gallops, or rubs noted.  Palpable radial and pedal pulses bilaterally Lungs: CTAB, no wheezes, rhonchi, or rales noted.  Respiratory effort nonlabored Abd: soft, NT, minimal distention, +BS, no masses, hernias, or organomegaly, lower midline incision from prostatectomy MS: all 4 extremities are symmetrical with no cyanosis, clubbing, or edema. Skin: warm and dry with no masses, lesions, or rashes, but several ecchymoses from fall and blood draws Psych: A&Ox3 with an appropriate affect.    Results for orders placed or performed during the hospital encounter of 07/22/2014 (from the past 48 hour(s))  Protime-INR     Status: Abnormal   Collection Time: 07/15/14  5:18 AM  Result Value Ref Range   Prothrombin Time 17.4 (H) 11.6 - 15.2 seconds   INR 1.41 0.00 - 1.49  CBC     Status: Abnormal   Collection Time: 07/15/14  5:18 AM  Result Value Ref Range   WBC 7.5 4.0 - 10.5 K/uL   RBC 4.12 (L) 4.22 - 5.81 MIL/uL   Hemoglobin 13.4 13.0 - 17.0 g/dL   HCT 39.8 39.0 - 52.0 %   MCV 96.6 78.0 - 100.0 fL   MCH 32.5 26.0 - 34.0 pg   MCHC 33.7 30.0 - 36.0 g/dL   RDW 16.1 (H) 11.5 - 15.5 %   Platelets 222 150 - 400 K/uL  Basic metabolic panel     Status: Abnormal   Collection Time: 07/15/14  5:18 AM  Result Value Ref Range   Sodium 136 (L) 137 - 147 mEq/L   Potassium 4.6 3.7 - 5.3 mEq/L    Comment: DELTA CHECK NOTED   Chloride 96 96 - 112 mEq/L   CO2 30 19 - 32 mEq/L   Glucose, Bld 125 (H) 70 - 99 mg/dL   BUN 7 6 - 23 mg/dL   Creatinine, Ser 0.95 0.50 - 1.35 mg/dL   Calcium 9.6 8.4 - 10.5 mg/dL   GFR calc non Af Amer 72 (L) >90 mL/min   GFR calc Af Amer 83 (L) >90 mL/min    Comment: (NOTE) The eGFR has been calculated using the CKD EPI equation. This calculation has not  been validated in all clinical situations. eGFR's persistently <90 mL/min signify possible Chronic Kidney Disease.    Anion gap 10 5 - 15  Glucose, capillary     Status: Abnormal   Collection Time: 07/15/14  8:43 PM  Result Value Ref Range   Glucose-Capillary 126 (H) 70 - 99 mg/dL  Glucose, capillary     Status: Abnormal   Collection Time: 07/16/14 12:50 AM  Result Value Ref Range   Glucose-Capillary 125 (H) 70 - 99 mg/dL  Glucose, capillary     Status: Abnormal   Collection Time: 07/16/14  4:13 AM  Result Value Ref Range   Glucose-Capillary 116 (H) 70 - 99 mg/dL  Glucose, capillary     Status: Abnormal   Collection Time: 07/16/14  7:55 AM  Result Value Ref Range   Glucose-Capillary 111 (H) 70 - 99 mg/dL   Comment 1 Notify RN    Dg Abd Acute W/chest  07/15/2014   CLINICAL DATA:  80 year old male with small bowel obstruction. Initial encounter. Hospitalization last month for cervical and thoracic spine fractures after a fall at home.  EXAM: ACUTE ABDOMEN SERIES (ABDOMEN 2 VIEW & CHEST 1 VIEW)  COMPARISON:  Abdominal series 500370. CT Abdomen and Pelvis 07/23/2014, and earlier  FINDINGS: Upright AP view of the chest. Improved lower lobe ventilation, less collapse or consolidation. Small right pleural effusion. No pneumothorax or pneumoperitoneum. Stable cardiac size and mediastinal contours. Left chest cardiac AICD.  Enteric tube in place, side hole to level of the distal stomach. Thoracolumbar junction kyphoplasty or vertebroplasty changes again noted.  Interval regressed small bowel dilatation. Gas-filled small bowel now measures up to 36 mm diameter, previously 46 mm. There remains a paucity of colonic gas.  Extensive calcified atherosclerosis including in the abdominal aorta and iliac arteries. No acute osseous abnormality identified.  IMPRESSION: 1. Enteric tube in place, side hole at the level of the distal stomach. 2. Interval decreased distension of small bowel. Paucity of large bowel  gas remain suggesting incompletely resolved small bowel obstruction. 3. No pneumoperitoneum. 4. Improved lower lobe ventilation.  Small right pleural effusion.   Electronically Signed   By: Lars Pinks M.D.   On: 07/15/2014 06:16       Assessment/Plan 1. GI bleed secondary to duodenitis, resolved 2. SBO 3. Recent fall with multiple fx of cervical and thoracic spine, epidural hematoma 4. Chronic a fib, on coumadin 5. Systolic HF 6. HTN 7. AICD  Plan: 1. Patient appears to have an obstruction on films from [facility].  Will repeat abdominal x-rays today and cont NGT.  2. Cont to hold his coumadin 3. Cardiology clearance today would be beneficial for this patient, given his age and other cardiac issues.  He was diagnosed with pulmonary HTN as well on recent ECHO at [facility]. 4. If he does not improve, he may require surgical intervention.  I am not sure given his limited mobility from recent fall, age, etc how well he would do with a major operation, but if he doesn't resolve, he may not have much other choice. 5. We will follow along.  Chermaine Schnyder E 07/16/2014, 8:05 AM Pager: [phone]

## 2014-07-17 DIAGNOSIS — R55 Syncope and collapse: Secondary | ICD-10-CM

## 2014-07-17 DIAGNOSIS — Z95810 Presence of automatic (implantable) cardiac defibrillator: Secondary | ICD-10-CM

## 2014-07-17 DIAGNOSIS — I480 Paroxysmal atrial fibrillation: Secondary | ICD-10-CM

## 2014-07-17 LAB — CBC
HEMATOCRIT: 41.6 % (ref 39.0–52.0)
Hemoglobin: 13.5 g/dL (ref 13.0–17.0)
MCH: 32.1 pg (ref 26.0–34.0)
MCHC: 32.5 g/dL (ref 30.0–36.0)
MCV: 98.8 fL (ref 78.0–100.0)
Platelets: 254 10*3/uL (ref 150–400)
RBC: 4.21 MIL/uL — ABNORMAL LOW (ref 4.22–5.81)
RDW: 16.2 % — ABNORMAL HIGH (ref 11.5–15.5)
WBC: 9.2 10*3/uL (ref 4.0–10.5)

## 2014-07-17 LAB — COMPREHENSIVE METABOLIC PANEL
ALT: 16 U/L (ref 0–53)
ANION GAP: 11 (ref 5–15)
AST: 21 U/L (ref 0–37)
Albumin: 3 g/dL — ABNORMAL LOW (ref 3.5–5.2)
Alkaline Phosphatase: 150 U/L — ABNORMAL HIGH (ref 39–117)
BUN: 16 mg/dL (ref 6–23)
CALCIUM: 9.6 mg/dL (ref 8.4–10.5)
CO2: 31 meq/L (ref 19–32)
CREATININE: 1.05 mg/dL (ref 0.50–1.35)
Chloride: 93 mEq/L — ABNORMAL LOW (ref 96–112)
GFR, EST AFRICAN AMERICAN: 70 mL/min — AB (ref >90)
GFR, EST NON AFRICAN AMERICAN: 61 mL/min — AB (ref >90)
Glucose, Bld: 131 mg/dL — ABNORMAL HIGH (ref 70–99)
Potassium: 4.3 mEq/L (ref 3.7–5.3)
Sodium: 135 mEq/L — ABNORMAL LOW (ref 137–147)
TOTAL PROTEIN: 6.8 g/dL (ref 6.0–8.3)
Total Bilirubin: 2.3 mg/dL — ABNORMAL HIGH (ref 0.3–1.2)

## 2014-07-17 LAB — GLUCOSE, CAPILLARY
GLUCOSE-CAPILLARY: 142 mg/dL — AB (ref 70–99)
Glucose-Capillary: 111 mg/dL — ABNORMAL HIGH (ref 70–99)
Glucose-Capillary: 117 mg/dL — ABNORMAL HIGH (ref 70–99)
Glucose-Capillary: 118 mg/dL — ABNORMAL HIGH (ref 70–99)
Glucose-Capillary: 126 mg/dL — ABNORMAL HIGH (ref 70–99)
Glucose-Capillary: 132 mg/dL — ABNORMAL HIGH (ref 70–99)
Glucose-Capillary: 134 mg/dL — ABNORMAL HIGH (ref 70–99)
Glucose-Capillary: 142 mg/dL — ABNORMAL HIGH (ref 70–99)

## 2014-07-17 LAB — DIFFERENTIAL
BASOS PCT: 0 % (ref 0–1)
Basophils Absolute: 0 10*3/uL (ref 0.0–0.1)
EOS ABS: 0.1 10*3/uL (ref 0.0–0.7)
EOS PCT: 2 % (ref 0–5)
Lymphocytes Relative: 16 % (ref 12–46)
Lymphs Abs: 1.5 10*3/uL (ref 0.7–4.0)
MONO ABS: 1.2 10*3/uL — AB (ref 0.1–1.0)
Monocytes Relative: 13 % — ABNORMAL HIGH (ref 3–12)
Neutro Abs: 6.4 10*3/uL (ref 1.7–7.7)
Neutrophils Relative %: 69 % (ref 43–77)

## 2014-07-17 LAB — TRIGLYCERIDES: Triglycerides: 82 mg/dL (ref <150)

## 2014-07-17 LAB — MAGNESIUM: Magnesium: 1.7 mg/dL (ref 1.5–2.5)

## 2014-07-17 LAB — PROTIME-INR
INR: 1.27 (ref 0.00–1.49)
Prothrombin Time: 16 seconds — ABNORMAL HIGH (ref 11.6–15.2)

## 2014-07-17 LAB — PREALBUMIN: Prealbumin: 8.4 mg/dL — ABNORMAL LOW (ref 17.0–34.0)

## 2014-07-17 LAB — PHOSPHORUS: PHOSPHORUS: 3.3 mg/dL (ref 2.3–4.6)

## 2014-07-17 MED ORDER — SODIUM CHLORIDE 0.9 % IV BOLUS (SEPSIS): 500.0000 mL | Freq: Once | INTRAVENOUS | Status: DC

## 2014-07-17 MED ORDER — SODIUM CHLORIDE 0.45 % IV SOLN: INTRAVENOUS | Status: DC

## 2014-07-17 MED ORDER — FAT EMULSION 20 % IV EMUL
240.0000 mL | INTRAVENOUS | Status: AC
  Administered 2014-07-17: 240 mL via INTRAVENOUS
  Filled 2014-07-17: qty 250

## 2014-07-17 MED ORDER — TRACE MINERALS CR-CU-F-FE-I-MN-MO-SE-ZN IV SOLN
INTRAVENOUS | Status: AC
  Administered 2014-07-17: 18:00:00 via INTRAVENOUS
  Filled 2014-07-17: qty 2000

## 2014-07-17 MED ORDER — SODIUM CHLORIDE 0.9 % IV BOLUS (SEPSIS)
250.0000 mL | Freq: Once | INTRAVENOUS | Status: AC
  Administered 2014-07-17: 250 mL via INTRAVENOUS

## 2014-07-17 MED ORDER — SODIUM CHLORIDE 0.9 % IV SOLN
INTRAVENOUS | Status: DC
  Administered 2014-07-17 – 2014-07-20 (×3): via INTRAVENOUS

## 2014-07-17 NOTE — Progress Notes (Signed)
Performed auscultation of NG tube and confirmed placement with hearing auscultaion of air. Currently patient is resting soundly without any complaints of pain or discomfort. Will continue to monitor patient to end of shift.

## 2014-07-17 NOTE — Plan of Care (Signed)
Problem: Phase II Progression Outcomes Goal: Progress activity as tolerated unless otherwise ordered Outcome: Progressing Goal: Discharge plan established Outcome: Not Progressing Goal: Vital signs remain stable Outcome: Not Progressing Goal: IV changed to normal saline lock Outcome: Not Progressing Goal: Obtain order to discontinue catheter if appropriate Outcome: Not Applicable Date Met:  81/38/87

## 2014-07-17 NOTE — Progress Notes (Signed)
NUTRITION FOLLOW UP  Intervention:   -TPN per Pharmacy - Diet advancement per MD - Recommend adding Ensure Complete when diet is advanced until PO intake is determined adequate  Nutrition Dx:   Inadequate oral intake related to Upper GI bleed as evidenced by NPO; ongoing  Goal:   Pt to meet >/= 90% of their estimated nutrition needs; unmet  Monitor:   Diet advancement, nutrition support measures, Po intake, labs and wt trends   Assessment:   [facility] He has upper GIB, hematemesis, acute blood loss anemia, possible small bowel obstruction, chronic CHF, multiple fx of thoracic spine, cholelithiasis. Pt has 10% wt loss this past year. He is at risk for malnutrition due to his upper GI bleed/NPO status which is limiting his ability to take in nutrition orally  11/17: Pt was advanced to a clear liquid diet on 07/12/14, but was unable to tolerate due to emesis. NGT was inserted on 07/13/14 due to SBO, as pt did not respond to conservative therapy. Chart review indicates that NG out pt has been approximately 1850 ml thus far.  Noted a 4# (2.5%) wt gain x 4 days, likely due to positive fluid balance.  Labs reviewed. Glucose: 119. Mg and K WDL.  11/20: RD consulted due to new TPN/TNA.  Patient is currently receiving Clinimix E 5/15 to 4465mL/hr + 20% fat emulsion at 2110mL/hr which provides 48g protein and 1162kcal. Goal rate will be 5383mL/hr + fats at 3410mL/hr- this will provide 96g protein and 1894kcal- this will meet 99% of estimated energy needs and 100% of estimated protein needs. Patient's weight has dropped significantly since admission at which time he weighed 161 lbs- 16 lbs weight loss. 10% weight loss in less than one month is severe for time frame.  Labs: low sodium, low chloride, low albumin, prealbumin pending   Height: Ht Readings from Last 1 Encounters:  07/15/14 6\' 1"  (1.854 m)    Weight Status:   Wt Readings from Last 1 Encounters:  07/17/14 145 lb 6.4 oz (65.953 kg)      07/10/14 161 lb 14.4 oz (73.437 kg)   Re-estimated needs:  Kcal: 1950-2150 Protein: 85-100 grams Fluid: 1.9-2.1 L  Skin: ecchymosis; +1 LUE edema  Diet Order: NPO TPN (CLINIMIX-E) Adult TPN (CLINIMIX-E) Adult   Intake/Output Summary (Last 24 hours) at 07/17/14 0944 Last data filed at 07/17/14 0751  Gross per 24 hour  Intake   1460 ml  Output   5400 ml  Net  -3940 ml    Last BM: 07/12/14   Labs:   Recent Labs Lab 07/14/14 0417 07/15/14 0518 07/17/14 0445  NA 140 136* 135*  K 3.8 4.6 4.3  CL 100 96 93*  CO2 30 30 31   BUN 7 7 16   CREATININE 0.90 0.95 1.05  CALCIUM 9.0 9.6 9.6  MG  --   --  1.7  PHOS  --   --  3.3  GLUCOSE 119* 125* 131*    CBG (last 3)   Recent Labs  07/16/14 1958 07/17/14 0019 07/17/14 0423  GLUCAP 111* 142* 142*    Scheduled Meds: . sodium chloride   Intravenous Once  . antiseptic oral rinse  7 mL Mouth Rinse BID  . enoxaparin (LOVENOX) injection  60 mg Subcutaneous Q12H  . insulin aspart  0-9 Units Subcutaneous 6 times per day  . ondansetron (ZOFRAN) IV  4 mg Intravenous 4 times per day  . pantoprazole (PROTONIX) IV  40 mg Intravenous Q12H  Continuous Infusions: . sodium chloride 60 mL/hr at 07/17/14 0654  . sodium chloride    . diltiazem (CARDIZEM) infusion 5 mg/hr (07/17/14 0655)  . Marland Kitchen.TPN (CLINIMIX-E) Adult 40 mL/hr at 07/16/14 1956   And  . fat emulsion 240 mL (07/16/14 1955)  . Marland Kitchen.TPN (CLINIMIX-E) Adult     And  . fat emulsion     Ian Malkineanne Barnett RD, LDN Inpatient Clinical Dietitian Pager: [phone] After Hours Pager: [phone]

## 2014-07-17 NOTE — Progress Notes (Signed)
PARENTERAL NUTRITION CONSULT NOTE - FOLLOW UP  Pharmacy Consult for TPN Indication: SBO  No Known Allergies  Patient Measurements: Height: 6' 1" (185.4 cm) Weight: 145 lb 6.4 oz (65.953 kg) (bed scale; back brace on) IBW/kg (Calculated) : 79.9 Adjusted Body Weight:  Usual Weight:   Vital Signs: Temp: 98.4 F (36.9 C) (11/20 0747) Temp Source: Oral (11/20 0747) BP: 98/61 mmHg (11/20 0747) Pulse Rate: 101 (11/20 0747) Intake/Output from previous day: 11/19 0701 - 11/20 0700 In: 1460 [I.V.:931.5; TPN:528.5] Out: 5100 [Urine:3050; Emesis/NG output:2050] Intake/Output from this shift: Total I/O In: -  Out: 300 [Emesis/NG output:300]  Labs:  Recent Labs  07/15/14 0518 07/17/14 0445  WBC 7.5 9.2  HGB 13.4 13.5  HCT 39.8 41.6  PLT 222 254  INR 1.41 1.27     Recent Labs  07/15/14 0518 07/17/14 0445  NA 136* 135*  K 4.6 4.3  CL 96 93*  CO2 30 31  GLUCOSE 125* 131*  BUN 7 16  CREATININE 0.95 1.05  CALCIUM 9.6 9.6  MG  --  1.7  PHOS  --  3.3  PROT  --  6.8  ALBUMIN  --  3.0*  AST  --  21  ALT  --  16  ALKPHOS  --  150*  BILITOT  --  2.3*  TRIG  --  82   Estimated Creatinine Clearance: 44.5 mL/min (by C-G formula based on Cr of 1.05).    Recent Labs  07/16/14 1958 07/17/14 0019 07/17/14 0423  GLUCAP 111* 142* 142*    Medications:  Scheduled:  . sodium chloride   Intravenous Once  . antiseptic oral rinse  7 mL Mouth Rinse BID  . enoxaparin (LOVENOX) injection  60 mg Subcutaneous Q12H  . insulin aspart  0-9 Units Subcutaneous 6 times per day  . ondansetron (ZOFRAN) IV  4 mg Intravenous 4 times per day  . pantoprazole (PROTONIX) IV  40 mg Intravenous Q12H    Insulin Requirements in the past 24 hours:  2 units Sensitive SSI, no insulin in TPN  Current Nutrition:  NPO  Nutritional Goals:  1900-2100 kCal, 90-100 grams of protein per day per RD recommendations 11/17  Admit: 74 YOM transferred from [facility]- originally brought in for GIB  and after the bleeding stopped, he was unable to pass flatus or BM and an SBO was noted on CT and XR. Too high risk for surgery at [facility] and transferred here. Repeating films  GI:  NGT placed on 11/16 per records, 2026m out since yesterday morning. Abd non-tender, non-distended with hypoactive BS.  On IV PPI and schedule Zofran currently.  Endo: CBGs 111, 142, 142  Lytes:  Na 135.  K, Mg, Phos wnl.  Renal:  Cr 1.05, UOP 3050.  Pulm:  [place]-3L  Cards:  Cardiomyopathy, ICD, hx AFib and systolic HF; warfarin is being held- continue to hold- on full dose Lovenox. BP soft-nml, HR nml-tachy; lisinopril and lasix  Hepatobil:  Alk Phos 150, o/w LFTs wnl.  Albumin low at 3.  Neuro: s/p fall with broken neck, (+)epidural hematoma.  (+)thoracic spine fractures.- [facility]  ID:  AFeb, WBC wnl, on no abx.  Best Practices: MC, Lovenox  TPN Access:  PICC- double lumen TPN day#:  11/19 >>  Plan:  1. Advance Clinimix E 5/15 to 647mhr + 20% fat emulsion at 1091mr- this will provide 48g protein and 1162kcal. Goal rate will be 30m60m + fats at 10mL65m  this will provide 96g protein and 1894kcal 2. Multivitamin and trace elements in TPN 3. Start CBGs and sensitive SSI q4h 4. Change MIVF to 1/2NS at 82m/hr starting at 1800 tonight with start of TPN 5. TPN labs as ordered on Mon,Thurs.  BMet in AM. 6. Follow for RD recommendations and signs of refeeding  KGracy Bruins PharmD Clinical Pharmacist [facility]

## 2014-07-17 NOTE — Progress Notes (Addendum)
Subjective: No chest pain, mild stomach discomfort.   Objective: Vital signs in last 24 hours: Temp:  [97.4 F (36.3 C)-98.9 F (37.2 C)] 98.4 F (36.9 C) (11/20 0747) Pulse Rate:  [99-111] 101 (11/20 0747) Resp:  [16-18] 18 (11/20 0747) BP: (98-124)/(61-70) 98/61 mmHg (11/20 0747) SpO2:  [97 %-100 %] 98 % (11/20 0747) Weight:  [145 lb 6.4 oz (65.953 kg)] 145 lb 6.4 oz (65.953 kg) (11/20 0647) Weight change: -2 lb 1.4 oz (-0.947 kg) Last BM Date: 07/12/14 Intake/Output from previous day: -2380  11/19 0701 - 11/20 0700 In: 1460 [I.V.:931.5; TPN:528.5] Out: 5100 [Urine:3050; Emesis/NG output:2050] Intake/Output this shift: Total I/O In: -  Out: 300 [Emesis/NG output:300]  PE: General:Pleasant affect, NAD Skin:Warm and dry, brisk capillary refill HEENT:normocephalic, sclera clear, mucus membranes moist, NG to suction, green liquid  Heart:irreg irreg without murmur, gallup, rub or click Lungs:clear, ant.  without rales, rhonchi, or wheezes UJW:JXBJAbd:soft, mild tenderness, do not palpate liver spleen or masses Ext:no lower ext edema, 2+ pedal pulses, 2+ radial pulses Neuro:alert and oriented X 3, MAE, follows commands, + facial symmetry Upper body brace in place.  tele: atrial fib rate 100-110  On IV dilt at 5 mg/hr    Lab Results:  Recent Labs  07/15/14 0518 07/17/14 0445  WBC 7.5 9.2  HGB 13.4 13.5  HCT 39.8 41.6  PLT 222 254   BMET  Recent Labs  07/15/14 0518 07/17/14 0445  NA 136* 135*  K 4.6 4.3  CL 96 93*  CO2 30 31  GLUCOSE 125* 131*  BUN 7 16  CREATININE 0.95 1.05  CALCIUM 9.6 9.6   No results for input(s): TROPONINI in the last 72 hours.  Invalid input(s): CK, MB  Lab Results  Component Value Date   TRIG 82 07/17/2014   No results found for: HGBA1C   Lab Results  Component Value Date   TSH 1.570 07/14/2014    Hepatic Function Panel  Recent Labs  07/17/14 0445  PROT 6.8  ALBUMIN 3.0*  AST 21  ALT 16  ALKPHOS 150*    BILITOT 2.3*   No results for input(s): CHOL in the last 72 hours. No results for input(s): PROTIME in the last 72 hours.     Studies/Results: Dg Chest Port 1 View  07/16/2014   CLINICAL DATA:  Right-sided PICC line insertion  EXAM: PORTABLE CHEST - 1 VIEW  COMPARISON:  Radiograph 07/15/2014  FINDINGS: Interval placement of right PICC line with tip in the distal SVC. NG tube unchanged. Defibrillator overlies normal cardiac silhouette. No effusion, infiltrate, or pneumothorax. Left basilar atelectasis.  IMPRESSION: Right PICC line in good position   Electronically Signed   By: Genevive BiStewart  Edmunds M.D.   On: 07/16/2014 16:54   Dg Abd 2 Views  07/16/2014   CLINICAL DATA:  Evaluate NG tube placement and followup small bowel obstruction  EXAM: ABDOMEN - 2 VIEW  COMPARISON:  07/15/2014  FINDINGS: The NG tube tip is in the distal stomach or proximal duodenum. Dilated loops of small bowel are again identified and appear slightly improved from previous exam. No new findings identified.  IMPRESSION: 1. Mild improvement in small bowel dilatation. 2. Satisfactory position of nasogastric tube.   Electronically Signed   By: Signa Kellaylor  Stroud M.D.   On: 07/16/2014 09:55   Dg Ugi W/small Bowel  07/17/2014   CLINICAL DATA:  Small bowel obstruction.  EXAM: UPPER GI SERIES WITH SMALL BOWEL FOLLOW-THROUGH  FLUOROSCOPY TIME:  2 min and 30 seconds  TECHNIQUE: Combined double contrast and single contrast upper GI series using effervescent crystals, thick barium, and thin barium. Subsequently, serial images of the small bowel were obtained including spot views of the terminal ileum.  COMPARISON:  CT scan 07/16/2014  FINDINGS: The existing NG tube was injected with Omnipaque 300. The stomach appears normal. Normal emptying into a normal caliber duodenum. Slow progression of contrast through the small bowel which is mildly dilated. Transition from mildly dilated to nondilated/decompressed small bowel noted in the mid pelvic  area likely due to adhesions and consistent with the prior CT findings. Contrast did reach the colon at approximately 11 and 30 min.  IMPRESSION: Persistent partial small bowel obstruction as above.   Electronically Signed   By: Loralie ChampagneMark  Gallerani M.D.   On: 07/17/2014 09:50    Medications: I have reviewed the patient's current medications. Scheduled Meds: . sodium chloride   Intravenous Once  . antiseptic oral rinse  7 mL Mouth Rinse BID  . enoxaparin (LOVENOX) injection  60 mg Subcutaneous Q12H  . insulin aspart  0-9 Units Subcutaneous 6 times per day  . ondansetron (ZOFRAN) IV  4 mg Intravenous 4 times per day  . pantoprazole (PROTONIX) IV  40 mg Intravenous Q12H   Continuous Infusions: . sodium chloride 60 mL/hr at 07/17/14 0654  . sodium chloride    . diltiazem (CARDIZEM) infusion 5 mg/hr (07/17/14 0655)  . Marland Kitchen.TPN (CLINIMIX-E) Adult 40 mL/hr at 07/16/14 1956   And  . fat emulsion 240 mL (07/16/14 1955)  . Marland Kitchen.TPN (CLINIMIX-E) Adult     And  . fat emulsion     PRN Meds:.acetaminophen, acetaminophen, levalbuterol, LORazepam, morphine injection, sodium chloride  Assessment/Plan:80 year old gentleman transferred from [facility] with small bowel obstruction. Preoperative cardiac evaluation had been requested.  1. Small bowel obstruction. NG with > 2L in 24 hours , no surgery for now, CCS monitoring 2. Chronic atrial fibrillation. Warfarin on hold- INR at 1.27 today. Receiving lovenox. Rate control with IV diltiazem currently at 5mg /hr  while unable to take his home medications. 3. Past history of cardiomyopathy. ICD in place. ([employer]) 4. Mild left ventricular systolic dysfunction with recent echocardiogram showing ejection fraction 45-50% 5.  Recent fall with multiple fx of cervical and thoracic spine, epidural hematoma. Currently wearing brace  From a cardiac standpoint he appears to be stable. There is no evidence of congestive heart failure. He is not having any  evidence of ischemia clinically. From cardiac standpoint he would be cleared for surgery although at higher than average risk because of his age and frailty.  LOS: 8 days   Time spent with pt. :15 minutes. [facility] IncNGOLD,LAURA R  Nurse Practitioner Certified Pager [phone] or after 5pm and on weekends call [phone] 07/17/2014, 11:29 AM  I have examined the patient and reviewed assessment and plan and discussed with patient.  Agree with above as stated.  AFib rate controlled.  Explained cardiac issues to the family that was present. Heart failure stable.  AICD in place.  VARANASI,JAYADEEP S.

## 2014-07-17 NOTE — Consult Note (Signed)
NEURO HOSPITALIST CONSULT NOTE    Reason for Consult: syncope versus seizure  HPI:                                                                                                                                          Mark Day is an 80 y.o. male with atrial fibrillation on Coumadin (currently held due to GI bleed), chronic systolic heart failure, AICD with history of  Cardiomyopathy and hypertension who had a recent hospitalization at [facility] from September to October 2015 with treatment for cervical and thoracic fractures and epidural hematoma following a fall at home. He presented to [facility] on 07/03/2014 with nausea vomiting and possible hematemesis. He was begun on treatment with Augmentin by Dr. Leanord Hawking at [facility] for possible diverticulitis. In the ER abdominal CT scan revealed small bowel obstruction in distal jejunum/proximal ileum. PT was working with patient . While working with patient his BP was noted to be 74/46, He was assisted from bed to chair. Upon reaching chair he was noted to have a very brief periods of bilateral arm shaking, eyes rolled back and not responsive.  Per PT Tech he quickly came back to his baseline and was able to follow commands. Currently he is back to his baseline and in bed with no further episodes.  His BP remains very low after 250 cc bolus of NS and recent BP is 86/53.   Past Medical History  Diagnosis Date  . Osteoarthritis   . Chronic diarrhea   . Hypertension   . Fracture of vertebra 10/2013    Lumbar  . Epidural hematoma 04/2014     subarachnoid hemorrhage status post fall-Tx at [facility]  . Chronic systolic heart failure 04/2014    at [facility]-ejection fraction 45-50%; severe ulnar hypertension  . AICD (automatic cardioverter/defibrillator) present   . Atrial fibrillation     On coumadin  . Multiple fractures of thoracic spine 04/2014    S/p tx at [facility]  . Cervical spine fracture 04/2014    Tx  at [facility]  . Cholelithiasis 07/10/2014  . Tremor of both hands   . Chronic anticoagulation     With warfarin  . Pulmonary hypertension 04/2014    per echo at [facility]  . Atrial dilatation, bilateral 04/2014    per echo at [facility]; severe  . Duodenitis with bleeding 07/03/2014    Past Surgical History  Procedure Laterality Date  . Prostatectomy    . Colonoscopy    . Neck surgery    . Esophagogastroduodenoscopy N/A 07/10/2014    Procedure: ESOPHAGOGASTRODUODENOSCOPY (EGD);  Surgeon: Malissa Hippo, MD;  Location: [facility];  Service: Endoscopy;  Laterality: N/A;    Family History  Problem Relation Age of Onset  . Healthy Brother   .  Healthy Son   . Healthy Son      Social History:  reports that he has never smoked. He has never used smokeless tobacco. He reports that he drinks alcohol. He reports that he does not use illicit drugs.  No Known Allergies  MEDICATIONS:                                                                                                                     Prior to Admission:  Prescriptions prior to admission  Medication Sig Dispense Refill Last Dose  . ALPRAZolam (XANAX) 0.25 MG tablet Take 0.125 mg by mouth every 6 (six) hours as needed for anxiety.    07/08/2014 at Unknown time  . amoxicillin-clavulanate (AUGMENTIN) 875-125 MG per tablet Take 1 tablet by mouth 2 (two) times daily. Starting 07/06/2014 and ending 07/10/2014.   07/08/2014 at Unknown time  . aspirin 81 MG tablet Take 81 mg by mouth daily.   07/08/2014 at Unknown time  . atorvastatin (LIPITOR) 10 MG tablet Take 10 mg by mouth daily.   07/08/2014 at Unknown time  . escitalopram (LEXAPRO) 10 MG tablet Take 10 mg by mouth daily.   07/08/2014 at Unknown time  . lisinopril (PRINIVIL,ZESTRIL) 10 MG tablet Take 10 mg by mouth daily.   07/08/2014 at Unknown time  . metoprolol (LOPRESSOR) 50 MG tablet Take 50 mg by mouth 2 (two) times daily.   07/08/2014 at 2000  . warfarin (COUMADIN) 6 MG  tablet Take 6 mg by mouth daily.   07/08/2014 at Unknown time  . docusate sodium (COLACE) 100 MG capsule Take 200 mg by mouth daily.   Taking  . warfarin (COUMADIN) 4 MG tablet Take 4 mg by mouth every other day. Alternate between 4mg  and 5mg    Taking  . warfarin (COUMADIN) 5 MG tablet Take 5 mg by mouth every other day. Alternate between 4mg  and 5mg    Taking   Scheduled: . sodium chloride   Intravenous Once  . antiseptic oral rinse  7 mL Mouth Rinse BID  . enoxaparin (LOVENOX) injection  60 mg Subcutaneous Q12H  . insulin aspart  0-9 Units Subcutaneous 6 times per day  . ondansetron (ZOFRAN) IV  4 mg Intravenous 4 times per day  . pantoprazole (PROTONIX) IV  40 mg Intravenous Q12H     ROS:  History obtained from the patient  General ROS: negative for - chills, fatigue, fever, night sweats, weight gain or weight loss Psychological ROS: negative for - behavioral disorder, hallucinations, memory difficulties, mood swings or suicidal ideation Ophthalmic ROS: negative for - blurry vision, double vision, eye pain or loss of vision ENT ROS: negative for - epistaxis, nasal discharge, oral lesions, sore throat, tinnitus or vertigo Allergy and Immunology ROS: negative for - hives or itchy/watery eyes Hematological and Lymphatic ROS: negative for - bleeding problems, bruising or swollen lymph nodes Endocrine ROS: negative for - galactorrhea, hair pattern changes, polydipsia/polyuria or temperature intolerance Respiratory ROS: negative for - cough, hemoptysis, shortness of breath or wheezing Cardiovascular ROS: negative for - chest pain, dyspnea on exertion, edema or irregular heartbeat Gastrointestinal ROS: negative for - abdominal pain, diarrhea, hematemesis, nausea/vomiting or stool incontinence Genito-Urinary ROS: negative for - dysuria, hematuria, incontinence or  urinary frequency/urgency Musculoskeletal ROS: negative for - joint swelling or muscular weakness Neurological ROS: as noted in HPI Dermatological ROS: negative for rash and skin lesion changes   Blood pressure 86/53, pulse 74, temperature 98 F (36.7 C), temperature source Oral, resp. rate 16, height 6\' 1"  (1.854 m), weight 65.953 kg (145 lb 6.4 oz), SpO2 99 %.   Neurologic Examination:                                                                                                      Physical Exam  Constitutional: He appears well-developed and well-nourished.  Psych: Affect appropriate to situation Eyes: No scleral injection HENT: No OP obstrucion Head: Normocephalic.  Cardiovascular: Normal rate and regular rhythm.  Respiratory: Effort normal and breath sounds normal.  GI: Soft. Bowel sounds are decreased. No distension. There is no tenderness.  Skin: WDI  Neuro Exam: Mental Status: Alert, oriented to hospital, 2015 but believes it is December.  Speech fluent without evidence of aphasia.  Able to follow 3 step commands without difficulty. Cranial Nerves: II: Discs flat bilaterally; Visual fields grossly normal, pupils equal, round, reactive to light and accommodation III,IV, VI: ptosis not present, extra-ocular motions intact bilaterally V,VII: smile shows slight right facial droop but hard to fully assess as he has a hard collar present pushing on bilateral cheeks. , facial light touch sensation normal bilaterally VIII: hearing normal bilaterally IX,X: gag reflex present XI: bilateral shoulder shrug XII: midline tongue extension without atrophy or fasciculations  Motor: Right : Upper extremity   4/5    Left:     Upper extremity   4/5  Lower extremity   4/5     Lower extremity   4/5 --bilateral UE resting tremor with postural component (old) Tone and bulk:normal tone throughout; decreased muscle bulk throughout Sensory: Pinprick and light touch intact throughout,  bilaterally Deep Tendon Reflexes:  Right: Upper Extremity   Left: Upper extremity   biceps (C-5 to C-6) 2/4   biceps (C-5 to C-6) 2/4 tricep (C7) 2/4    triceps (C7) 2/4 Brachioradialis (C6) 2/4  Brachioradialis (C6) 2/4  Lower Extremity Lower Extremity  quadriceps (L-2 to L-4) 2/4  quadriceps (L-2 to L-4) 2/4 Achilles (S1) 1/4   Achilles (S1) 1/4  Plantars: Right: downgoing   Left: downgoing Cerebellar: normal finger-to-nose,  Normal H-S but very weak.  Gait: not tested due to saftey CV: pulses palpable throughout    Lab Results: Basic Metabolic Panel:  Recent Labs Lab 07/12/14 1220 07/13/14 1757 07/14/14 0417 07/15/14 0518 07/17/14 0445  NA 144 138 140 136* 135*  K 4.1 3.7 3.8 4.6 4.3  CL 106 99 100 96 93*  CO2 28 29 30 30 31   GLUCOSE 137* 128* 119* 125* 131*  BUN 12 8 7 7 16   CREATININE 0.89 0.90 0.90 0.95 1.05  CALCIUM 9.3 9.1 9.0 9.6 9.6  MG  --   --   --   --  1.7  PHOS  --   --   --   --  3.3    Liver Function Tests:  Recent Labs Lab 07/17/14 0445  AST 21  ALT 16  ALKPHOS 150*  BILITOT 2.3*  PROT 6.8  ALBUMIN 3.0*   No results for input(s): LIPASE, AMYLASE in the last 168 hours. No results for input(s): AMMONIA in the last 168 hours.  CBC:  Recent Labs Lab 07/12/14 0714 07/13/14 0441 07/13/14 1757 07/15/14 0518 07/17/14 0445  WBC 6.6 7.8 7.4 7.5 9.2  NEUTROABS  --   --   --   --  6.4  HGB 12.6* 12.6* 12.0* 13.4 13.5  HCT 38.2* 37.9* 36.0* 39.8 41.6  MCV 99.2 97.9 96.5 96.6 98.8  PLT 185 204 200 222 254    Cardiac Enzymes: No results for input(s): CKTOTAL, CKMB, CKMBINDEX, TROPONINI in the last 168 hours.  Lipid Panel:  Recent Labs Lab 07/17/14 0445  TRIG 82    CBG:  Recent Labs Lab 07/16/14 1654 07/16/14 1958 07/17/14 0019 07/17/14 0423 07/17/14 1142  GLUCAP 117* 111* 142* 142* 132*    Microbiology: Results for orders placed or performed during the hospital encounter of 07/18/2014  MRSA PCR Screening     Status:  None   Collection Time: 07/05/2014  3:15 PM  Result Value Ref Range Status   MRSA by PCR NEGATIVE NEGATIVE Final    Comment:        The GeneXpert MRSA Assay (FDA approved for NASAL specimens only), is one component of a comprehensive MRSA colonization surveillance program. It is not intended to diagnose MRSA infection nor to guide or monitor treatment for MRSA infections.   H.pylori Antigen, Stool     Status: None   Collection Time: 2013-12-18 12:45 PM  Result Value Ref Range Status   Specimen Description STOOL  Final   Special Requests NONE  Final   H. pylori ag, stool   Final    NEGATIVE Antimicrobials,proton pump inhibitors and bismuth preparations are known to suppress H.pylori and ingestion of these prior to testing may cause a false negative result. If a negative result is obtained for a patient that has  ingested these  compounds within two weeks prior of performing the H.pylori test, results may be falsely negative and should be repeated with a new specimen two weeks after discontinuing treatment. Performed at [facility]    Report Status 07/14/2014 FINAL  Final    Coagulation Studies:  Recent Labs  07/15/14 0518 07/17/14 0445  LABPROT 17.4* 16.0*  INR 1.41 1.27    Imaging: Dg Chest Port 1 View  07/16/2014   CLINICAL DATA:  Right-sided PICC line insertion  EXAM: PORTABLE CHEST - 1 VIEW  COMPARISON:  Radiograph 07/15/2014  FINDINGS: Interval placement of right PICC line with tip in the distal SVC. NG tube unchanged. Defibrillator overlies normal cardiac silhouette. No effusion, infiltrate, or pneumothorax. Left basilar atelectasis.  IMPRESSION: Right PICC line in good position   Electronically Signed   By: Genevive Bi M.D.   On: 07/16/2014 16:54   Dg Abd 2 Views  07/16/2014   CLINICAL DATA:  Evaluate NG tube placement and followup small bowel obstruction  EXAM: ABDOMEN - 2 VIEW  COMPARISON:  07/15/2014  FINDINGS: The NG tube tip is in the distal stomach  or proximal duodenum. Dilated loops of small bowel are again identified and appear slightly improved from previous exam. No new findings identified.  IMPRESSION: 1. Mild improvement in small bowel dilatation. 2. Satisfactory position of nasogastric tube.   Electronically Signed   By: Signa Kell M.D.   On: 07/16/2014 09:55   Dg Ugi W/small Bowel  07/17/2014   CLINICAL DATA:  Small bowel obstruction.  EXAM: UPPER GI SERIES WITH SMALL BOWEL FOLLOW-THROUGH  FLUOROSCOPY TIME:  2 min and 30 seconds  TECHNIQUE: Combined double contrast and single contrast upper GI series using effervescent crystals, thick barium, and thin barium. Subsequently, serial images of the small bowel were obtained including spot views of the terminal ileum.  COMPARISON:  CT scan 07/10/2014  FINDINGS: The existing NG tube was injected with Omnipaque 300. The stomach appears normal. Normal emptying into a normal caliber duodenum. Slow progression of contrast through the small bowel which is mildly dilated. Transition from mildly dilated to nondilated/decompressed small bowel noted in the mid pelvic area likely due to adhesions and consistent with the prior CT findings. Contrast did reach the colon at approximately 11 and 30 min.  IMPRESSION: Persistent partial small bowel obstruction as above.   Electronically Signed   By: Loralie Champagne M.D.   On: 07/17/2014 09:50    Assessment and plan per attending neurologist  Felicie Morn PA-C [facility] [phone]  07/17/2014, 2:41 PM   Assessment/Plan:  80 YO male with brief transient period of bilateral arm shaking, eyes rolled back and loss of consciousness in the setting of hypotension.  Given the brief duration and no postictal period and that this occurred in the setting of postural changes and hypotension,  this likely represents convulsive syncope. If EEG is negative no further workup is needed.   Recommend: 1) EEG-- 2) At this time would not recommend MRI or  initiation of AED 3) If EEG is negative, neurology will sign off. Please call with any questions.   Ritta Slot, MD [facility] [phone]  If 7pm- 7am, please page neurology on call as listed in AMION.

## 2014-07-17 NOTE — Progress Notes (Signed)
LOS: 8 days   Subjective: Patient without complaint. NPO with NGT.    Objective: Vital signs in last 24 hours: Temp:  [97.4 F (36.3 C)-98.9 F (37.2 C)] 98.4 F (36.9 C) (11/20 0747) Pulse Rate:  [99-111] 101 (11/20 0747) Resp:  [16-18] 18 (11/20 0747) BP: (98-124)/(61-70) 98/61 mmHg (11/20 0747) SpO2:  [97 %-100 %] 98 % (11/20 0747) Weight:  [65.953 kg (145 lb 6.4 oz)] 65.953 kg (145 lb 6.4 oz) (11/20 0647) Last BM Date: 07/12/14   Laboratory  CBC  Recent Labs  07/15/14 0518 07/17/14 0445  WBC 7.5 9.2  HGB 13.4 13.5  HCT 39.8 41.6  PLT 222 254   BMET  Recent Labs  07/15/14 0518 07/17/14 0445  NA 136* 135*  K 4.6 4.3  CL 96 93*  CO2 30 31  GLUCOSE 125* 131*  BUN 7 16  CREATININE 0.95 1.05  CALCIUM 9.6 9.6     Physical Exam General appearance: alert, cooperative and no distress Resp: clear to auscultation bilaterally GI: NGT in place with bilious output. Abd soft, NTND, +BS   Assessment: 1. GI bleed secondary to duodenitis, resolved 2. SBO 3. Recent fall with multiple fx of cervical and thoracic spine, epidural hematoma. Currently wearing brace 4. Chronic a fib, coumadin currently on hold, receiving lovenox 5. Systolic HF 6. HTN 7. AICD  Plan: 1.SBO: gastrografin study from 07/16/2014 consistent with persistent SBO with transition point in pelvis. NGT output about 2L over past 24 hours.  2. Continue to hold his coumadin 3. Cardiology clearance obtained. Cleared for surgery with understandable high perioperative risk given age and multiple co-morbidities. May require nasotracheal intubation given recent C-spine fractures and current brace. Would need to discuss with anesthesia.  4. Afib/anticoag: INR now 1.27. Receiving Lovenox on Cardizem drip. 5. FEN: on TPN  Will discuss above with surgeon today.    Lemmie EvensPRESSON, Sanaiya Welliver [facility] CenterEE,PA-C [facility] Pager [phone]  07/17/2014

## 2014-07-17 NOTE — Progress Notes (Addendum)
[facility] Progress Note   Mark Day [id number] DOB: 07/21/1923 DOA: 07/01/2014 PCP: Kirstie PeriSHAH,ASHISH, MD  Brief narrative: Mark Doverhomas Brickell is a 80 y.o. male with atrial fibrillation on Coumadin, chronic systolic heart failure, AICD and hypertension who had a recent hospitalization at [facility] from September to October 2015 with treatment for cervical and thoracic fractures and epidural hematoma following a fall at home. He presented to [facility] on 07/01/2014 with nausea vomiting and possible hematemesis. He was begun on treatment with Augmentin by Dr. Leanord Hawkingobson at [facility] for possible diverticulitis. In the ER abdominal CT scan revealed  small bowel obstruction in distal jejunum/proximal ileum.    Subjective: Patient continues to be free of complaints. His 2 sons are at bedside with multiple questions which I have spent an extensive amount of time in answering.  Assessment/Plan: Principal Problem:   Duodenitis with bleeding -Occurring in setting of anticoagulation and now resolved -S/p EGD 11/14.  Active Problems:    Nausea and vomiting/ SBO  -Continue conservative management for now-surgery declined to evaluate [facility] at [facility] due to significant comorbidities [facility]  [facility] has evaluated him here -Continue Management per surgery-  -PICC line placed yesterday in TNA started by surgery -Upper GI series with small bowel follow-through reveals a transition point in the mid pelvic area  Seizure secondary to hypotension -Patient was assisted out of bed today by PT - once he stood up it was noted that his body began to shake and his eyes "rolled back into his head". According to the physical therapist, the episode lasted about 15 seconds. Once he stabilized, his vitals were checked and his blood pressure was noted to be low with systolic of 70s which is likely the cause. -I have asked neurology to evaluate-EEG will be  performed-  Hypotension -Suspect volume depletion-I held his Lasix yesterday and he is currently on fluids and TNA-I have given him a fluid bolus and will closely monitor his volume status-he has a history of "congestive heart failure" however, he is not on diuretics at home and I do not have an echo report in epic to know the severity of this.    Multiple fractures of cervical and thoracic spine - Continue brace -We'll eventually need to return to rehabilitation once stable    Warfarin-induced coagulopathy -Warfarin on hold now as surgery may be necessary- Lovenox bridge    Atrial fibrillation - on Cardizem infusion- rate controlled -Coumadin on hold with Lovenox bridge   AICD (automatic cardioverter/defibrillator) present  Chronic systolic heart failure - Compensated- held  Lasix yesterday-watching fluid status carefully    Code Status: Full code Family Communication: Wife at bedside Disposition Plan: SNF upon discharge DVT prophylaxis: Full dose Lovenox  Consultants: Surgery GI  Antibiotics: Anti-infectives    None      Objective: Filed Weights   07/15/14 1924 07/16/14 0548 07/17/14 0647  Weight: 66.9 kg (147 lb 7.8 oz) 66.7 kg (147 lb 0.8 oz) 65.953 kg (145 lb 6.4 oz)    Intake/Output Summary (Last 24 hours) at 07/17/14 1611 Last data filed at 07/17/14 0751  Gross per 24 hour  Intake   1460 ml  Output   5400 ml  Net  -3940 ml     Vitals Filed Vitals:   07/17/14 1134 07/17/14 1350 07/17/14 1400 07/17/14 1433  BP: 98/53 74/46 78/57  86/53  Pulse: 105 73 74   Temp: 98 F (36.7 C)     TempSrc: Oral     Resp: 18 16  Height:      Weight:      SpO2: 98% 99%      Exam: General: No acute respiratory distress- neck brace in place-alert and oriented 3 Lungs: Clear to auscultation bilaterally without wheezes or crackles Cardiovascular: Regular rate and rhythm without murmur gallop or rub normal S1 and S2 Abdomen: Nontender, nondistended, soft, bowel  sounds positive, no rebound, no ascites, no appreciable mass-G-tube in place and draining yellow-green fluid Extremities: No significant cyanosis, clubbing, or edema bilateral lower extremities  Data Reviewed: Basic Metabolic Panel:  Recent Labs Lab 07/12/14 1220 07/13/14 1757 07/14/14 0417 07/15/14 0518 07/17/14 0445  NA 144 138 140 136* 135*  K 4.1 3.7 3.8 4.6 4.3  CL 106 99 100 96 93*  CO2 28 29 30 30 31   GLUCOSE 137* 128* 119* 125* 131*  BUN 12 8 7 7 16   CREATININE 0.89 0.90 0.90 0.95 1.05  CALCIUM 9.3 9.1 9.0 9.6 9.6  MG  --   --   --   --  1.7  PHOS  --   --   --   --  3.3   Liver Function Tests:  Recent Labs Lab 07/17/14 0445  AST 21  ALT 16  ALKPHOS 150*  BILITOT 2.3*  PROT 6.8  ALBUMIN 3.0*   No results for input(s): LIPASE, AMYLASE in the last 168 hours. No results for input(s): AMMONIA in the last 168 hours. CBC:  Recent Labs Lab 07/12/14 0714 07/13/14 0441 07/13/14 1757 07/15/14 0518 07/17/14 0445  WBC 6.6 7.8 7.4 7.5 9.2  NEUTROABS  --   --   --   --  6.4  HGB 12.6* 12.6* 12.0* 13.4 13.5  HCT 38.2* 37.9* 36.0* 39.8 41.6  MCV 99.2 97.9 96.5 96.6 98.8  PLT 185 204 200 222 254   Cardiac Enzymes: No results for input(s): CKTOTAL, CKMB, CKMBINDEX, TROPONINI in the last 168 hours. BNP (last 3 results)  Recent Labs  Jul 19, 2014 0939  PROBNP 3979.0*   CBG:  Recent Labs Lab 07/16/14 1958 07/17/14 0019 07/17/14 0423 07/17/14 1142 07/17/14 1546  GLUCAP 111* 142* 142* 132* 134*    Recent Results (from the past 240 hour(s))  MRSA PCR Screening     Status: None   Collection Time: 07-19-14  3:15 PM  Result Value Ref Range Status   MRSA by PCR NEGATIVE NEGATIVE Final    Comment:        The GeneXpert MRSA Assay (FDA approved for NASAL specimens only), is one component of a comprehensive MRSA colonization surveillance program. It is not intended to diagnose MRSA infection nor to guide or monitor treatment for MRSA infections.    H.pylori Antigen, Stool     Status: None   Collection Time: 07/06/2014 12:45 PM  Result Value Ref Range Status   Specimen Description STOOL  Final   Special Requests NONE  Final   H. pylori ag, stool   Final    NEGATIVE Antimicrobials,proton pump inhibitors and bismuth preparations are known to suppress H.pylori and ingestion of these prior to testing may cause a false negative result. If a negative result is obtained for a patient that has  ingested these  compounds within two weeks prior of performing the H.pylori test, results may be falsely negative and should be repeated with a new specimen two weeks after discontinuing treatment. Performed at [facility]    Report Status 07/14/2014 FINAL  Final     Studies:  Recent x-ray studies have been reviewed in  detail by the Attending Physician  Scheduled Meds:  Scheduled Meds: . sodium chloride   Intravenous Once  . antiseptic oral rinse  7 mL Mouth Rinse BID  . enoxaparin (LOVENOX) injection  60 mg Subcutaneous Q12H  . insulin aspart  0-9 Units Subcutaneous 6 times per day  . ondansetron (ZOFRAN) IV  4 mg Intravenous 4 times per day  . pantoprazole (PROTONIX) IV  40 mg Intravenous Q12H   Continuous Infusions: . sodium chloride 60 mL/hr at 07/17/14 0654  . sodium chloride    . diltiazem (CARDIZEM) infusion 5 mg/hr (07/17/14 0655)  . Marland Kitchen.TPN (CLINIMIX-E) Adult 40 mL/hr at 07/16/14 1956   And  . fat emulsion 240 mL (07/16/14 1955)  . Marland Kitchen.TPN (CLINIMIX-E) Adult     And  . fat emulsion      Time spent on care of this patient: 35 min   Bryant Lipps, MD 07/17/2014, 4:11 PM  LOS: 8 days   [facility] Office  [phone] Pager - Text Page per [email or web address]  If 7PM-7AM, please contact night-coverage [email or web address]

## 2014-07-17 NOTE — Progress Notes (Signed)
1350 Physical therapist alerted  me that pt had episode of "seizure activity " described as generalized body tremors , and rolling of eyes  . Pt found seating in chair VS taken and charted . Pt is awake alert and oriented. And said not feeling good earlier . Made a call to MD . Spoke with physical therapist  Who was present during the incident . Repeat bp  Done and recorded MD aware . Stated plan plan of carewith order. Carried out the patient remained awake alert and oriented . Visited by a family member . Updated   1453 seen by neurlogy consult

## 2014-07-17 NOTE — Progress Notes (Signed)
PT Cancellation Note  Patient Details Name: Mark Day MRN: [id number] DOB: 05/17/1923   Cancelled Treatment:    Reason Eval/Treat Not Completed: Other (comment), Orders received for therapy, however, no guidelines re: cervical and thoracic fx, or guidelines for brace.  Spoke with ordering MD.  I will reach out to rehab center to obtain information regarding mobility and applicable restrictions or precautions prior to evaluation. Spoke with patient's sons to discuss plan. Will follow up this pm.  Mark Day, PT DPT  [phone]    Mark Day, Mark Day 07/17/2014, 10:24 AM

## 2014-07-17 NOTE — Plan of Care (Addendum)
Per patient's son, the patient did not have any restrictions in regards to ambulation and therapy while at [facility]. He is adamant that the patient be allowed to get out of bed today. PT is hesitant to get patient out of bed without knowing what type of restrictions they will need in regards to his mobility. Based upon what the patient's son is telling me, if he was able to participate with [facility] at the [facility] without restrictions,  I feel that PT can begin therapy with the patient today without restrictions.   Calvert CantorSaima Raelynn Corron, MD

## 2014-07-17 NOTE — Evaluation (Addendum)
Physical Therapy Evaluation Patient Details Name: Mark Day MRN: [id number] DOB: 10/02/1922 Today's Date: 07/17/2014   History of Present Illness  80 y.o. male with a history of recent hospitalization at [facility] from September to October 2015 with treatment for cervical and thoracic fractures and epidural hematoma following a fall at home.  [facility], found to have small bowel obstruction; changes highly suggestive of bilateral pulmonary emboli; multiple gallstones without complicating factors, and diverticulosis without diverticulitis.   Clinical Impression  Patient seen for OOB and mobility evaluation.  Patient demonstrates deficits in functional mobility as indicated below. Will need continued skilled PT to address deficits and maximize function. Will see as indicated and progress as tolerated.  OF NOTE: Per MD okay to see no restrictions or precautions.  For conservative management, basic cervical and spinal precautions adhered to during session.   During EOB patient feeling well, pleasant without complaints.  Assisted patient OOB to chair, upon reaching chair patient became unresponsive, started convulsing and had eyes rolled back in his head.  This episode was brief, during episode patient positioned safely in chair by therapy tech and myself, call bell engaged and nsg notified.  Technician obtained dynamap and BP assessed (approximately 2-3 mins after event) BP was 70s/30s, nsg and MD aware, patient positioned in reclined position with legs elevated, asymptomatic, BPs continuously assessed and remained low but were improving.  Per nsg request, patient transferred back to bed and repositioned.   Spoke with MD via phone to give account of events.      Follow Up Recommendations SNF;Supervision/Assistance - 24 hour (plan to return to [facility])    Equipment Recommendations  None recommended by PT    Recommendations for Other Services        Precautions / Restrictions Precautions Precautions: Fall Precaution Comments: NG tube in place, ? spinal precautions?  Required Braces or Orthoses: Cervical Brace;Spinal Brace Cervical Brace: Hard collar;At all times Spinal Brace: Other (comment) Spinal Brace Comments: CTO Restrictions Weight Bearing Restrictions: No      Mobility  Bed Mobility Overal bed mobility: Needs Assistance;+2 for physical assistance Bed Mobility: Supine to Sit;Sit to Supine     Supine to sit: Max assist;+2 for physical assistance (helicopter technique) Sit to supine: Max assist;+2 for physical assistance   General bed mobility comments: patient physically limited with all mobility, assist required from LE movement, trunk rotation and support to upright, assist for return to supine.   Transfers Overall transfer level: Needs assistance Equipment used:  (+ 2 with gait belt and chuck pad) Transfers: Sit to/from Stand;Stand Pivot Transfers Sit to Stand: Max assist;+2 physical assistance Stand pivot transfers: Max assist;+2 physical assistance       General transfer comment: patient unable to elevate to standing, maximal assist for transfer, difficulty initiating steps, during standing less responsive, upon completeion of transfer patient it seizure like activity (convulsions, eyes rolled back, and unresponsive approximately 15 seconds, nsg aware  Ambulation/Gait                Stairs            Wheelchair Mobility    Modified Rankin (Stroke Patients Only)       Balance Overall balance assessment: Needs assistance Sitting-balance support: Feet supported Sitting balance-Leahy Scale: Poor Sitting balance - Comments: patient with right lateral lean and posterior bias, required support to maintain upright. Postural control: Posterior lean;Right lateral lean  Pertinent Vitals/Pain Pain Assessment: No/denies pain    Home Living  Family/patient expects to be discharged to:: Skilled nursing facility                      Prior Function           Comments: per son was taking a few steps witih RW, and transferring to<>chair     Hand Dominance   Dominant Hand: Right    Extremity/Trunk Assessment   Upper Extremity Assessment: Defer to OT evaluation           Lower Extremity Assessment: Generalized weakness;RLE deficits/detail;LLE deficits/detail RLE Deficits / Details: significant weakness noted bilaterally, difficult to assess, patient with increased contraction ? tone? in LE, states "i feel like my legs are connected together"; difficult to break tone LLE Deficits / Details: significant weakness noted bilaterally, unable to isolate testing  Cervical / Trunk Assessment:  (cervical and thoracic fxs, brace in place)  Communication   Communication: HOH  Cognition Arousal/Alertness: Awake/alert Behavior During Therapy: WFL for tasks assessed/performed Overall Cognitive Status: Impaired/Different from baseline Area of Impairment: Following commands;Safety/judgement;Awareness;Problem solving       Following Commands: Follows one step commands consistently Safety/Judgement: Decreased awareness of deficits Awareness: Intellectual Problem Solving: Slow processing;Decreased initiation;Difficulty sequencing;Requires verbal cues;Requires tactile cues General Comments: patient unable to recognize poor postural alighnment or demonstrate ability to correct without tactile cues    General Comments      Exercises        Assessment/Plan    PT Assessment Patient needs continued PT services  PT Diagnosis Difficulty walking;Generalized weakness   PT Problem List Decreased strength;Decreased range of motion;Decreased activity tolerance;Decreased balance;Decreased mobility;Decreased coordination;Decreased safety awareness;Impaired sensation  PT Treatment Interventions DME instruction;Gait  training;Functional mobility training;Therapeutic activities;Therapeutic exercise;Balance training;Patient/family education   PT Goals (Current goals can be found in the Care Plan section) Acute Rehab PT Goals Patient Stated Goal: to walk again PT Goal Formulation: With patient Time For Goal Achievement: 07/31/14 Potential to Achieve Goals: Fair    Frequency Min 2X/week   Barriers to discharge        Co-evaluation               End of Session Equipment Utilized During Treatment: Gait belt;Back brace;Cervical collar Activity Tolerance: Treatment limited secondary to medical complications (Comment) (low BP) Patient left: in bed;with call bell/phone within reach Nurse Communication: Mobility status;Need for lift equipment;Precautions         Time: 1322-1406 PT Time Calculation (min) (ACUTE ONLY): 44 min   Charges:   PT Evaluation $Initial PT Evaluation Tier I: 1 Procedure PT Treatments $Therapeutic Activity: 38-52 mins   PT G CodesFabio Asa:          Altie Savard J 07/17/2014, 2:50 PM Charlotte Crumbevon Kasie Leccese, PT DPT  [phone]

## 2014-07-18 ENCOUNTER — Ambulatory Visit (HOSPITAL_COMMUNITY): Payer: Medicare Other

## 2014-07-18 LAB — GLUCOSE, CAPILLARY
GLUCOSE-CAPILLARY: 127 mg/dL — AB (ref 70–99)
Glucose-Capillary: 128 mg/dL — ABNORMAL HIGH (ref 70–99)
Glucose-Capillary: 136 mg/dL — ABNORMAL HIGH (ref 70–99)
Glucose-Capillary: 142 mg/dL — ABNORMAL HIGH (ref 70–99)
Glucose-Capillary: 136 mg/dL — ABNORMAL HIGH (ref 70–99)

## 2014-07-18 LAB — BASIC METABOLIC PANEL
Anion gap: 14 (ref 5–15)
BUN: 23 mg/dL (ref 6–23)
CO2: 28 meq/L (ref 19–32)
Calcium: 9.2 mg/dL (ref 8.4–10.5)
Chloride: 93 mEq/L — ABNORMAL LOW (ref 96–112)
Creatinine, Ser: 1.01 mg/dL (ref 0.50–1.35)
GFR calc Af Amer: 74 mL/min — ABNORMAL LOW (ref >90)
GFR, EST NON AFRICAN AMERICAN: 64 mL/min — AB (ref >90)
GLUCOSE: 119 mg/dL — AB (ref 70–99)
POTASSIUM: 4.3 meq/L (ref 3.7–5.3)
SODIUM: 135 meq/L — AB (ref 137–147)

## 2014-07-18 LAB — PROTIME-INR
INR: 1.27 (ref 0.00–1.49)
Prothrombin Time: 16 seconds — ABNORMAL HIGH (ref 11.6–15.2)

## 2014-07-18 MED ORDER — FAT EMULSION 20 % IV EMUL
240.0000 mL | INTRAVENOUS | Status: AC
  Administered 2014-07-18: 240 mL via INTRAVENOUS
  Filled 2014-07-18: qty 250

## 2014-07-18 MED ORDER — BENZOCAINE 20 % MT SOLN
Freq: Three times a day (TID) | OROMUCOSAL | Status: DC | PRN
  Filled 2014-07-18 (×2): qty 57

## 2014-07-18 MED ORDER — TRACE MINERALS CR-CU-F-FE-I-MN-MO-SE-ZN IV SOLN
INTRAVENOUS | Status: AC
Start: 2014-07-18 — End: 2014-07-19
  Administered 2014-07-18: 18:00:00 via INTRAVENOUS
  Filled 2014-07-18: qty 2000

## 2014-07-18 MED ORDER — MORPHINE SULFATE 2 MG/ML IJ SOLN
1.0000 mg | INTRAMUSCULAR | Status: DC | PRN
  Administered 2014-07-23 (×2): 1 mg via INTRAVENOUS
  Filled 2014-07-18 (×3): qty 1

## 2014-07-18 MED ORDER — INSULIN ASPART 100 UNIT/ML ~~LOC~~ SOLN
0.0000 [IU] | SUBCUTANEOUS | Status: DC
  Administered 2014-07-18 – 2014-07-19 (×9): 1 [IU] via SUBCUTANEOUS
  Administered 2014-07-20: 2 [IU] via SUBCUTANEOUS
  Administered 2014-07-20 (×3): 1 [IU] via SUBCUTANEOUS
  Administered 2014-07-20: 3 [IU] via SUBCUTANEOUS
  Administered 2014-07-21: 2 [IU] via SUBCUTANEOUS
  Administered 2014-07-21 (×3): 1 [IU] via SUBCUTANEOUS
  Administered 2014-07-21: 2 [IU] via SUBCUTANEOUS
  Administered 2014-07-22: 1 [IU] via SUBCUTANEOUS
  Administered 2014-07-22 (×5): 2 [IU] via SUBCUTANEOUS
  Administered 2014-07-23: 1 [IU] via SUBCUTANEOUS
  Administered 2014-07-23: 2 [IU] via SUBCUTANEOUS
  Administered 2014-07-23 – 2014-07-24 (×9): 1 [IU] via SUBCUTANEOUS

## 2014-07-18 NOTE — Progress Notes (Signed)
[facility] Progress Note   Mark Doverhomas Barhorst [id number] DOB: 01/20/1923 DOA: 18-Jun-2014 PCP: Kirstie PeriSHAH,ASHISH, MD  Brief narrative: Mark Day is a 80 y.o. male with atrial fibrillation on Coumadin, chronic systolic heart failure, AICD and hypertension who had a recent hospitalization at [facility] from September to October 2015 with treatment for cervical and thoracic fractures and epidural hematoma following a fall at home. He presented to [facility] on 11-15-13 with nausea vomiting and possible hematemesis. He was begun on treatment with Augmentin by Dr. Leanord [facility]obson [facility] for possible diverticulitis. In the ER abdominal CT scan revealed  small bowel obstruction in distal jejunum/proximal ileum.    Subjective: No complaints today-   Assessment/Plan: Principal Problem:   Duodenitis with bleeding -Occurring in setting of anticoagulation and now resolved -S/p EGD 11/14.  Active Problems:    Nausea and vomiting/ SBO  -Continue conservative management for now- [facility] surgery has evaluated him here and are managing  -PICC line placed - TNA started by surgery -Upper GI series with small bowel follow-through reveals a transition point in the mid pelvic area but some contrast is still passing through this point.   Seizure secondary to hypotension- Convulsive syncope -Patient was assisted out of bed ton 11/20 by PT - once he stood up it was noted that his body began to shake and his eyes "rolled back into his head". According to the physical therapist, the episode lasted about 15 seconds. Once he stabilized, his vitals were checked and his blood pressure was noted to be low with systolic of 70s which was likely the cause.  Hypotension -Suspect volume depletion- - have increase IVF to help match output from NG/  Urine output - closely monitor his volume status-he has a history of "congestive heart failure" however, he is not on diuretics at home and I  do not have an echo report in epic to know the severity of this.- will order ECHO    Multiple fractures of cervical and thoracic spine - Continue brace -We'll eventually need to return to rehabilitation once stable    Warfarin-induced coagulopathy -Warfarin on hold now as surgery may be necessary- Lovenox bridge    Atrial fibrillation - on Cardizem infusion- rate controlled -Coumadin on hold with Lovenox bridge   AICD (automatic cardioverter/defibrillator) present  Chronic systolic heart failure - Compensated- -watching fluid status carefully    Code Status: Full code Family Communication: Wife at bedside Disposition Plan: SNF upon discharge DVT prophylaxis: Full dose Lovenox  Consultants: Surgery GI  Antibiotics: Anti-infectives    None      Objective: Filed Weights   07/16/14 0548 07/17/14 0647 07/18/14 0400  Weight: 66.7 kg (147 lb 0.8 oz) 65.953 kg (145 lb 6.4 oz) 66.4 kg (146 lb 6.2 oz)    Intake/Output Summary (Last 24 hours) at 07/18/14 1138 Last data filed at 07/18/14 0300  Gross per 24 hour  Intake    100 ml  Output    700 ml  Net   -600 ml     Vitals Filed Vitals:   07/17/14 1754 07/17/14 2032 07/18/14 0135 07/18/14 0400  BP: 113/62 106/67 98/66 110/68  Pulse:  118 75 111  Temp:  98.6 F (37 C)  97.8 F (36.6 C)  TempSrc:  Oral  Oral  Resp:  18 18 18   Height:      Weight:    66.4 kg (146 lb 6.2 oz)  SpO2:  98% 98% 94%    Exam: General: No acute  respiratory distress- neck brace in place-alert and oriented 3 Lungs: Clear to auscultation bilaterally without wheezes or crackles Cardiovascular: Regular rate and rhythm without murmur gallop or rub normal S1 and S2 Abdomen: Nontender, nondistended, soft, bowel sounds positive, no rebound, no ascites, no appreciable mass-G-tube in place and draining yellow-green fluid Extremities: No significant cyanosis, clubbing, or edema bilateral lower extremities  Data Reviewed: Basic Metabolic  Panel:  Recent Labs Lab 07/13/14 1757 07/14/14 0417 07/15/14 0518 07/17/14 0445 07/18/14 0412  NA 138 140 136* 135* 135*  K 3.7 3.8 4.6 4.3 4.3  CL 99 100 96 93* 93*  CO2 29 30 30 31 28   GLUCOSE 128* 119* 125* 131* 119*  BUN 8 7 7 16 23   CREATININE 0.90 0.90 0.95 1.05 1.01  CALCIUM 9.1 9.0 9.6 9.6 9.2  MG  --   --   --  1.7  --   PHOS  --   --   --  3.3  --    Liver Function Tests:  Recent Labs Lab 07/17/14 0445  AST 21  ALT 16  ALKPHOS 150*  BILITOT 2.3*  PROT 6.8  ALBUMIN 3.0*   No results for input(s): LIPASE, AMYLASE in the last 168 hours. No results for input(s): AMMONIA in the last 168 hours. CBC:  Recent Labs Lab 07/12/14 0714 07/13/14 0441 07/13/14 1757 07/15/14 0518 07/17/14 0445  WBC 6.6 7.8 7.4 7.5 9.2  NEUTROABS  --   --   --   --  6.4  HGB 12.6* 12.6* 12.0* 13.4 13.5  HCT 38.2* 37.9* 36.0* 39.8 41.6  MCV 99.2 97.9 96.5 96.6 98.8  PLT 185 204 200 222 254   Cardiac Enzymes: No results for input(s): CKTOTAL, CKMB, CKMBINDEX, TROPONINI in the last 168 hours. BNP (last 3 results)  Recent Labs  07/08/2014 0939  PROBNP 3979.0*   CBG:  Recent Labs Lab 07/17/14 1546 07/17/14 1935 07/18/14 07/18/14 0356 07/18/14 1100  GLUCAP 134* 126* 118* 128* 136*    Recent Results (from the past 240 hour(s))  MRSA PCR Screening     Status: None   Collection Time: 07/07/2014  3:15 PM  Result Value Ref Range Status   MRSA by PCR NEGATIVE NEGATIVE Final    Comment:        The GeneXpert MRSA Assay (FDA approved for NASAL specimens only), is one component of a comprehensive MRSA colonization surveillance program. It is not intended to diagnose MRSA infection nor to guide or monitor treatment for MRSA infections.   H.pylori Antigen, Stool     Status: None   Collection Time: 07/20/2014 12:45 PM  Result Value Ref Range Status   Specimen Description STOOL  Final   Special Requests NONE  Final   H. pylori ag, stool   Final    NEGATIVE  Antimicrobials,proton pump inhibitors and bismuth preparations are known to suppress H.pylori and ingestion of these prior to testing may cause a false negative result. If a negative result is obtained for a patient that has  ingested these  compounds within two weeks prior of performing the H.pylori test, results may be falsely negative and should be repeated with a new specimen two weeks after discontinuing treatment. Performed at [facility]    Report Status 07/14/2014 FINAL  Final     Studies:  Recent x-ray studies have been reviewed in detail by the Attending Physician  Scheduled Meds:  Scheduled Meds: . sodium chloride   Intravenous Once  . antiseptic oral rinse  7  mL Mouth Rinse BID  . enoxaparin (LOVENOX) injection  60 mg Subcutaneous Q12H  . ondansetron (ZOFRAN) IV  4 mg Intravenous 4 times per day  . pantoprazole (PROTONIX) IV  40 mg Intravenous Q12H   Continuous Infusions: . sodium chloride 75 mL/hr at 07/17/14 1727  . diltiazem (CARDIZEM) infusion 5 mg/hr (07/17/14 0655)  . Marland KitchenTPN (CLINIMIX-E) Adult 40 mL/hr at 07/17/14 1731   And  . fat emulsion 240 mL (07/17/14 1730)    Time spent on care of this patient: 35 min   Dereonna Lensing, MD 07/18/2014, 11:38 AM  LOS: 9 days   [facility] Office  [phone] Pager - Text Page per [email or web address]  If 7PM-7AM, please contact night-coverage [email or web address]

## 2014-07-18 NOTE — Progress Notes (Signed)
7 Days Post-Op  Subjective: NGT came out during transfer to chair this AM, replaced, still high output but he is passing gas  Objective: Vital signs in last 24 hours: Temp:  [97.8 F (36.6 C)-98.6 F (37 C)] 97.8 F (36.6 C) (11/21 0400) Pulse Rate:  [73-118] 111 (11/21 0400) Resp:  [16-18] 18 (11/21 0400) BP: (74-113)/(46-68) 110/68 mmHg (11/21 0400) SpO2:  [94 %-99 %] 94 % (11/21 0400) Weight:  [146 lb 6.2 oz (66.4 kg)] 146 lb 6.2 oz (66.4 kg) (11/21 0400) Last BM Date: 07/12/14  Intake/Output from previous day: 11/20 0701 - 11/21 0700 In: 100 [NG/GT:100] Out: 700 [Urine:700] Intake/Output this shift:    General appearance: cooperative Chest wall: CTO GI: soft, mod dist, NT, +BS  Lab Results:   Recent Labs  07/17/14 0445  WBC 9.2  HGB 13.5  HCT 41.6  PLT 254   BMET  Recent Labs  07/17/14 0445 07/18/14 0412  NA 135* 135*  K 4.3 4.3  CL 93* 93*  CO2 31 28  GLUCOSE 131* 119*  BUN 16 23  CREATININE 1.05 1.01  CALCIUM 9.6 9.2   PT/INR  Recent Labs  07/17/14 0445 07/18/14 0412  LABPROT 16.0* 16.0*  INR 1.27 1.27   ABG No results for input(s): PHART, HCO3 in the last 72 hours.  Invalid input(s): PCO2, PO2  Studies/Results: Dg Chest Port 1 View  07/16/2014   CLINICAL DATA:  Right-sided PICC line insertion  EXAM: PORTABLE CHEST - 1 VIEW  COMPARISON:  Radiograph 07/15/2014  FINDINGS: Interval placement of right PICC line with tip in the distal SVC. NG tube unchanged. Defibrillator overlies normal cardiac silhouette. No effusion, infiltrate, or pneumothorax. Left basilar atelectasis.  IMPRESSION: Right PICC line in good position   Electronically Signed   By: Genevive BiStewart  Edmunds M.D.   On: 07/16/2014 16:54   Dg Ugi W/small Bowel  07/17/2014   CLINICAL DATA:  Small bowel obstruction.  EXAM: UPPER GI SERIES WITH SMALL BOWEL FOLLOW-THROUGH  FLUOROSCOPY TIME:  2 min and 30 seconds  TECHNIQUE: Combined double contrast and single contrast upper GI series using  effervescent crystals, thick barium, and thin barium. Subsequently, serial images of the small bowel were obtained including spot views of the terminal ileum.  COMPARISON:  CT scan 09/12/13  FINDINGS: The existing NG tube was injected with Omnipaque 300. The stomach appears normal. Normal emptying into a normal caliber duodenum. Slow progression of contrast through the small bowel which is mildly dilated. Transition from mildly dilated to nondilated/decompressed small bowel noted in the mid pelvic area likely due to adhesions and consistent with the prior CT findings. Contrast did reach the colon at approximately 11 and 30 min.  IMPRESSION: Persistent partial small bowel obstruction as above.   Electronically Signed   By: Loralie ChampagneMark  Gallerani M.D.   On: 07/17/2014 09:50    Anti-infectives: Anti-infectives    None      Assessment/Plan: PSBO - cont NGT today, await bowel function, very poor operative candidate. I D/W his son  LOS: 9 days    Tyarra Nolton E 07/18/2014

## 2014-07-18 NOTE — Significant Event (Signed)
Patient up chair with asstance of two staff. NGT came loose from tube clamp and fell out of patient when patient stood up. NGT replaced with positive placement check verified by two RN and reattached to intermittent wall suction.  Patient tolerated procedure well.

## 2014-07-18 NOTE — Progress Notes (Signed)
Subjective:  NG tube came out last night complaining of throat discomfort.  No chest pain or shortness of breath.  Objective:  Vital Signs in the last 24 hours: BP 110/68 mmHg  Pulse 111  Temp(Src) 97.8 F (36.6 C) (Oral)  Resp 18  Ht 6\' 1"  (1.854 m)  Wt 66.4 kg (146 lb 6.2 oz)  BMI 19.32 kg/m2  SpO2 94%  Physical Exam: Elderly white male with neck brace and NG tube in place, currently in no acute distress Lungs:  Somewhat difficult exam due to the hardware on his upper chest, but no rales heard Cardiac:  Regular rhythm, normal S1 and S2, no S3 Abdomen:  Soft, nontender, no masses Extremities:  No edema present  Intake/Output from previous day: 11/20 0701 - 11/21 0700 In: 100 [NG/GT:100] Out: 700 [Urine:700] Weight Filed Weights   07/16/14 0548 07/17/14 0647 07/18/14 0400  Weight: 66.7 kg (147 lb 0.8 oz) 65.953 kg (145 lb 6.4 oz) 66.4 kg (146 lb 6.2 oz)    Lab Results: Basic Metabolic Panel:  Recent Labs  16/05/9610/20/15 0445 07/18/14 0412  NA 135* 135*  K 4.3 4.3  CL 93* 93*  CO2 31 28  GLUCOSE 131* 119*  BUN 16 23  CREATININE 1.05 1.01    CBC:  Recent Labs  07/17/14 0445  WBC 9.2  NEUTROABS 6.4  HGB 13.5  HCT 41.6  MCV 98.8  PLT 254    BNP    Component Value Date/Time   PROBNP 3979.0* 2014-08-18 0939    PROTIME: Lab Results  Component Value Date   INR 1.27 07/18/2014   INR 1.27 07/17/2014   INR 1.41 07/15/2014    Telemetry: Atrial fibrillation with controlled response.    Assessment/Plan:  1.  Controlled atrial fibrillation. 2.  History of cardiomyopathy. 3.  ICD currently in place. 4.  Small bowel obstruction.  Recommendations:  Family is quite concerned about why he fell with the multiple injuries.  He does have a defibrillator and out the bradycardia was initiated but will need to have orthostatic vital signs and complete workup for other causes of falling, including physical therapy assessment.  Once he gets over his current  illness.      Darden PalmerW. Spencer Zeniah Briney, Jr.  MD [facility] Cardiology  07/18/2014, 12:37 PM

## 2014-07-18 NOTE — Progress Notes (Signed)
PARENTERAL NUTRITION CONSULT NOTE - FOLLOW UP  Pharmacy Consult for TPN Indication: SBO  No Known Allergies  Patient Measurements: Height: 6' 1"  (185.4 cm) Weight: 146 lb 6.2 oz (66.4 kg) IBW/kg (Calculated) : 79.9 Adjusted Body Weight:  Usual Weight:   Vital Signs: Temp: 97.8 F (36.6 C) (11/21 0400) Temp Source: Oral (11/21 0400) BP: 110/68 mmHg (11/21 0400) Pulse Rate: 111 (11/21 0400) Intake/Output from previous day: 11/20 0701 - 11/21 0700 In: 100 [NG/GT:100] Out: 700 [Urine:700] Intake/Output from this shift:    Labs:  Recent Labs  07/17/14 0445 07/18/14 0412  WBC 9.2  --   HGB 13.5  --   HCT 41.6  --   PLT 254  --   INR 1.27 1.27     Recent Labs  07/17/14 0445 07/18/14 0412  NA 135* 135*  K 4.3 4.3  CL 93* 93*  CO2 31 28  GLUCOSE 131* 119*  BUN 16 23  CREATININE 1.05 1.01  CALCIUM 9.6 9.2  MG 1.7  --   PHOS 3.3  --   PROT 6.8  --   ALBUMIN 3.0*  --   AST 21  --   ALT 16  --   ALKPHOS 150*  --   BILITOT 2.3*  --   PREALBUMIN 8.4*  --   TRIG 82  --    Estimated Creatinine Clearance: 46.6 mL/min (by C-G formula based on Cr of 1.01).    Recent Labs  07/18/14 07/18/14 0356 07/18/14 1100  GLUCAP 118* 128* 136*    Medications:  Scheduled:  . sodium chloride   Intravenous Once  . antiseptic oral rinse  7 mL Mouth Rinse BID  . enoxaparin (LOVENOX) injection  60 mg Subcutaneous Q12H  . ondansetron (ZOFRAN) IV  4 mg Intravenous 4 times per day  . pantoprazole (PROTONIX) IV  40 mg Intravenous Q12H    Insulin Requirements in the past 24 hours:  SSI d/c'd, no insulin in TPN  Current Nutrition:  NPO  Nutritional Goals:  1900-2100 kCal, 90-100 grams of protein per day per RD recommendations 11/17  Admit: 80 YOM transferred from [facility]- originally brought in for GIB and after the bleeding stopped, he was unable to pass flatus or BM and an SBO was noted on CT and XR. Too high risk for surgery at [facility] and transferred here. Repeating  films  GI: NGT placed on 11/16 per records, 2019m out since yesterday morning. Abd non-tender, non-distended with hypoactive BS. On IV PPI and scheduled Zofran currently.  Endo: CBGs 118-136  Lytes: Na 135. K wnl.  Renal: Cr 1.01, UOP 700 yest, sig drop but Lasix was stopped  Pulm: [place]-3L  Cards: Cardiomyopathy, ICD, hx AFib and systolic HF; warfarin is being held- continue to hold- on full dose Lovenox. BP soft-nml, HR nml-tachy  Hepatobil: Alk Phos 150, o/w LFTs wnl. Albumin low at 3.  Neuro: s/p fall with broken neck, (+)epidural hematoma. (+)thoracic spine fractures.- [facility] wearing extensive [facility]  ID: AFeb, WBC wnl, on no abx.  Best Practices: MC, Lovenox  TPN Access: PICC- double lumen TPN day#: 11/19 >>  Plan:  1. Advance Clinimix E 5/15 to goal rate 882mhr + 20% fat emulsion at 1028mr- this will provide 96g protein and 1894kcal 2. Multivitamin and trace elements in TPN 3. Resume CBGs and sensitive SSI q4h, since have not achieved goal rate as yet 4. Change MIVF to 1/2NS at 34m17m starting at 1800 tonight with start of  TPN 5. TPN labs as ordered on Mon,Thurs. BMet in AM. 6. Follow for RD recommendations and signs of refeeding  Gracy Bruins, PharmD Clinical Pharmacist [facility]

## 2014-07-19 LAB — BASIC METABOLIC PANEL
Anion gap: 13 (ref 5–15)
BUN: 23 mg/dL (ref 6–23)
CO2: 26 mEq/L (ref 19–32)
Calcium: 8.9 mg/dL (ref 8.4–10.5)
Chloride: 97 mEq/L (ref 96–112)
Creatinine, Ser: 0.81 mg/dL (ref 0.50–1.35)
GFR, EST AFRICAN AMERICAN: 89 mL/min — AB (ref >90)
GFR, EST NON AFRICAN AMERICAN: 77 mL/min — AB (ref >90)
Glucose, Bld: 139 mg/dL — ABNORMAL HIGH (ref 70–99)
POTASSIUM: 4.1 meq/L (ref 3.7–5.3)
SODIUM: 136 meq/L — AB (ref 137–147)

## 2014-07-19 LAB — GLUCOSE, CAPILLARY
GLUCOSE-CAPILLARY: 151 mg/dL — AB (ref 70–99)
GLUCOSE-CAPILLARY: 132 mg/dL — AB (ref 70–99)
GLUCOSE-CAPILLARY: 131 mg/dL — AB (ref 70–99)
Glucose-Capillary: 124 mg/dL — ABNORMAL HIGH (ref 70–99)
Glucose-Capillary: 135 mg/dL — ABNORMAL HIGH (ref 70–99)
Glucose-Capillary: 142 mg/dL — ABNORMAL HIGH (ref 70–99)

## 2014-07-19 LAB — PROTIME-INR
INR: 1.2 (ref 0.00–1.49)
PROTHROMBIN TIME: 15.3 s — AB (ref 11.6–15.2)

## 2014-07-19 MED ORDER — TRACE MINERALS CR-CU-F-FE-I-MN-MO-SE-ZN IV SOLN
INTRAVENOUS | Status: AC
  Administered 2014-07-19: 18:00:00 via INTRAVENOUS
  Filled 2014-07-19: qty 2000

## 2014-07-19 MED ORDER — TOBRAMYCIN 0.3 % OP SOLN
1.0000 [drp] | Freq: Four times a day (QID) | OPHTHALMIC | Status: DC
  Administered 2014-07-19 – 2014-07-24 (×20): 1 [drp] via OPHTHALMIC
  Filled 2014-07-19 (×2): qty 5

## 2014-07-19 MED ORDER — FAT EMULSION 20 % IV EMUL
240.0000 mL | INTRAVENOUS | Status: AC
  Administered 2014-07-19: 240 mL via INTRAVENOUS
  Filled 2014-07-19: qty 250

## 2014-07-19 MED ORDER — ENOXAPARIN SODIUM 80 MG/0.8ML ~~LOC~~ SOLN
1.0000 mg/kg | Freq: Two times a day (BID) | SUBCUTANEOUS | Status: DC
  Administered 2014-07-19 – 2014-07-24 (×10): 70 mg via SUBCUTANEOUS
  Filled 2014-07-19 (×13): qty 0.8

## 2014-07-19 NOTE — Progress Notes (Signed)
[facility] Progress Note   Mark Day ZOX:[id number] DOB: 1923/06/16 DOA: 07/07/2014 PCP: Kirstie Peri, MD  Brief narrative: Mark Day is a 80 y.o. male with atrial fibrillation on Coumadin, chronic systolic heart failure, AICD and hypertension who had a recent hospitalization at [facility] September to October 2015 with treatment for cervical and thoracic fractures and epidural hematoma following a fall at home. He presented to [facility] on 07/23/2014 with nausea vomiting and possible hematemesis. He was begun on treatment with Augmentin by Dr. Leanord Hawking at [facility] for possible diverticulitis. In the ER abdominal CT scan revealed  small bowel obstruction in distal jejunum/proximal ileum.    Subjective: States he doesn't feel well but not able to specify. He denies pain, nausea and abdominal pain. Per RN, he had a small BM today.   Assessment/Plan: Principal Problem:   Duodenitis with bleeding -Occurring in setting of anticoagulation and now resolved -S/p EGD 11/14.  Active Problems:    Nausea and vomiting/ SBO  -Continue conservative management for now- [facility] surgery has evaluated him here and are managing  -PICC line placed - TNA started by surgery -Upper GI series with small bowel follow-through reveals a transition point in the mid pelvic area but some contrast is still passing through this point.   Seizure secondary to hypotension- Convulsive syncope -Patient was assisted out of bed ton 11/20 by PT - once he stood up it was noted that his body began to shake and his eyes "rolled back into his head". According to the physical therapist, the episode lasted about 15 seconds. Once he stabilized, his vitals were checked and his blood pressure was noted to be low with systolic of 70s.  Hypotension -Suspect volume depletion- - have increase IVF to help match output from NG/  Urine output - closely monitor his volume status-he has a  history of "congestive heart failure" however, he is not on diuretics at home and I do not have an echo report in epic to know the severity of this - will order ECHO    Multiple fractures of cervical and thoracic spine - Continue brace -We'll eventually need to return to rehabilitation once stable    Warfarin-induced coagulopathy -Warfarin on hold now as surgery may be necessary- Lovenox bridge    Atrial fibrillation - on Cardizem infusion- rate controlled -Coumadin on hold with Lovenox bridge   AICD (automatic cardioverter/defibrillator) present  Chronic systolic heart failure - Compensated- -watching fluid status carefully    Code Status: Full code Family Communication: Wife at bedside Disposition Plan: SNF upon discharge DVT prophylaxis: Full dose Lovenox  Consultants: Surgery GI  Antibiotics: Anti-infectives    None      Objective: Filed Weights   07/17/14 0647 07/18/14 0400 07/19/14 0453  Weight: 65.953 kg (145 lb 6.4 oz) 66.4 kg (146 lb 6.2 oz) 70.031 kg (154 lb 6.3 oz)    Intake/Output Summary (Last 24 hours) at 07/19/14 0929 Last data filed at 07/19/14 0838  Gross per 24 hour  Intake 5623.83 ml  Output   1025 ml  Net 4598.83 ml     Vitals Filed Vitals:   07/18/14 0400 07/18/14 1435 07/18/14 1948 07/19/14 0453  BP: 110/68 115/64 115/70 125/76  Pulse: 111 101 100 100  Temp: 97.8 F (36.6 C) 98.3 F (36.8 C) 98.3 F (36.8 C) 98.7 F (37.1 C)  TempSrc: Oral Oral Oral Oral  Resp: 18 16 18 18   Height:      Weight: 66.4 kg (146 lb  6.2 oz)   70.031 kg (154 lb 6.3 oz)  SpO2: 94% 97% 97% 97%    Exam: General: No acute respiratory distress- neck brace in place-alert and oriented 3 Lungs: Clear to auscultation bilaterally without wheezes or crackles Cardiovascular: Regular rate and rhythm without murmur gallop or rub normal S1 and S2 Abdomen: Nontender, nondistended, soft, bowel sounds positive, no rebound, no ascites, no appreciable mass-G-tube in  place and draining yellow-green fluid Extremities: No significant cyanosis, clubbing, or edema bilateral lower extremities  Data Reviewed: Basic Metabolic Panel:  Recent Labs Lab 07/14/14 0417 07/15/14 0518 07/17/14 0445 07/18/14 0412 07/19/14 0502  NA 140 136* 135* 135* 136*  K 3.8 4.6 4.3 4.3 4.1  CL 100 96 93* 93* 97  CO2 30 30 31 28 26   GLUCOSE 119* 125* 131* 119* 139*  BUN 7 7 16 23 23   CREATININE 0.90 0.95 1.05 1.01 0.81  CALCIUM 9.0 9.6 9.6 9.2 8.9  MG  --   --  1.7  --   --   PHOS  --   --  3.3  --   --    Liver Function Tests:  Recent Labs Lab 07/17/14 0445  AST 21  ALT 16  ALKPHOS 150*  BILITOT 2.3*  PROT 6.8  ALBUMIN 3.0*   No results for input(s): LIPASE, AMYLASE in the last 168 hours. No results for input(s): AMMONIA in the last 168 hours. CBC:  Recent Labs Lab 07/13/14 0441 07/13/14 1757 07/15/14 0518 07/17/14 0445  WBC 7.8 7.4 7.5 9.2  NEUTROABS  --   --   --  6.4  HGB 12.6* 12.0* 13.4 13.5  HCT 37.9* 36.0* 39.8 41.6  MCV 97.9 96.5 96.6 98.8  PLT 204 200 222 254   Cardiac Enzymes: No results for input(s): CKTOTAL, CKMB, CKMBINDEX, TROPONINI in the last 168 hours. BNP (last 3 results)  Recent Labs  2013/11/01 0939  PROBNP 3979.0*   CBG:  Recent Labs Lab 07/18/14 1719 07/18/14 1950 07/19/14 0100 07/19/14 0442 07/19/14 0811  GLUCAP 127* 136* 124* 151* 132*    Recent Results (from the past 240 hour(s))  MRSA PCR Screening     Status: None   Collection Time: 2013/11/01  3:15 PM  Result Value Ref Range Status   MRSA by PCR NEGATIVE NEGATIVE Final    Comment:        The GeneXpert MRSA Assay (FDA approved for NASAL specimens only), is one component of a comprehensive MRSA colonization surveillance program. It is not intended to diagnose MRSA infection nor to guide or monitor treatment for MRSA infections.   H.pylori Antigen, Stool     Status: None   Collection Time: 07/10/2014 12:45 PM  Result Value Ref Range Status    Specimen Description STOOL  Final   Special Requests NONE  Final   H. pylori ag, stool   Final    NEGATIVE Antimicrobials,proton pump inhibitors and bismuth preparations are known to suppress H.pylori and ingestion of these prior to testing may cause a false negative result. If a negative result is obtained for a patient that has  ingested these  compounds within two weeks prior of performing the H.pylori test, results may be falsely negative and should be repeated with a new specimen two weeks after discontinuing treatment. Performed at [facility]    Report Status 07/14/2014 FINAL  Final     Studies:  Recent x-ray studies have been reviewed in detail by the Attending Physician  Scheduled Meds:  Scheduled Meds: .  sodium chloride   Intravenous Once  . antiseptic oral rinse  7 mL Mouth Rinse BID  . enoxaparin (LOVENOX) injection  60 mg Subcutaneous Q12H  . insulin aspart  0-9 Units Subcutaneous 6 times per day  . ondansetron (ZOFRAN) IV  4 mg Intravenous 4 times per day  . pantoprazole (PROTONIX) IV  40 mg Intravenous Q12H   Continuous Infusions: . sodium chloride 75 mL/hr at 07/18/14 1220  . diltiazem (CARDIZEM) infusion 5 mg/hr (07/17/14 0655)  . Marland Kitchen.TPN (CLINIMIX-E) Adult 83 mL/hr at 07/18/14 1745   And  . fat emulsion 240 mL (07/18/14 1745)    Time spent on care of this patient: 35 min   Oceanna Arruda, MD 07/19/2014, 9:29 AM  LOS: 10 days   [facility] Office  [phone] Pager - Text Page per [email or web address]  If 7PM-7AM, please contact night-coverage [email or web address]

## 2014-07-19 NOTE — Progress Notes (Signed)
  Echocardiogram 2D Echocardiogram has been performed.  Georgian CoWILLIAMS, Hadleigh Felber 07/19/2014, 4:12 PM

## 2014-07-19 NOTE — Progress Notes (Signed)
ANTICOAGULATION CONSULT NOTE  Pharmacy Consult for Lovenox Indication: atrial fibrillation  No Known Allergies  Patient Measurements: Height: 6\' 1"  (185.4 cm) Weight: 154 lb 6.3 oz (70.031 kg) IBW/kg (Calculated) : 79.9  Vital Signs: Temp: 98.7 F (37.1 C) (11/22 0453) Temp Source: Oral (11/22 0453) BP: 125/76 mmHg (11/22 0453) Pulse Rate: 100 (11/22 0453)  Labs:  Recent Labs  07/17/14 0445 07/18/14 0412 07/19/14 0502  HGB 13.5  --   --   HCT 41.6  --   --   PLT 254  --   --   LABPROT 16.0* 16.0* 15.3*  INR 1.27 1.27 1.20  CREATININE 1.05 1.01 0.81   Estimated Creatinine Clearance: 61.2 mL/min (by C-G formula based on Cr of 0.81).  Medical History: Past Medical History  Diagnosis Date  . Osteoarthritis   . Chronic diarrhea   . Hypertension   . Fracture of vertebra 10/2013    Lumbar  . Epidural hematoma 04/2014     subarachnoid hemorrhage status post [facility]  . Chronic systolic heart failure 04/2014    at [facility]-ejection fraction 45-50%; severe ulnar hypertension  . AICD (automatic cardioverter/defibrillator) present   . Atrial fibrillation     On coumadin  . Multiple fractures of thoracic spine 04/2014    [facility]  . Cervical spine fracture 04/2014    [facility]  . Cholelithiasis 07/07/2014  . Tremor of both hands   . Chronic anticoagulation     With warfarin  . Pulmonary hypertension 04/2014    [facility]  . Atrial dilatation, bilateral 04/2014    [facility]; severe  . Duodenitis with bleeding 07/04/2014   Medications:  Prescriptions prior to admission  Medication Sig Dispense Refill Last Dose  . ALPRAZolam (XANAX) 0.25 MG tablet Take 0.125 mg by mouth every 6 (six) hours as needed for anxiety.    07/08/2014 at Unknown time  . amoxicillin-clavulanate (AUGMENTIN) 875-125 MG per tablet Take 1 tablet by mouth 2 (two) times daily. Starting 07/06/2014 and ending 07/10/2014.   07/08/2014 at Unknown time  . aspirin 81  MG tablet Take 81 mg by mouth daily.   07/08/2014 at Unknown time  . atorvastatin (LIPITOR) 10 MG tablet Take 10 mg by mouth daily.   07/08/2014 at Unknown time  . escitalopram (LEXAPRO) 10 MG tablet Take 10 mg by mouth daily.   07/08/2014 at Unknown time  . lisinopril (PRINIVIL,ZESTRIL) 10 MG tablet Take 10 mg by mouth daily.   07/08/2014 at Unknown time  . metoprolol (LOPRESSOR) 50 MG tablet Take 50 mg by mouth 2 (two) times daily.   07/08/2014 at 2000  . warfarin (COUMADIN) 6 MG tablet Take 6 mg by mouth daily.   07/08/2014 at Unknown time  . docusate sodium (COLACE) 100 MG capsule Take 200 mg by mouth daily.   Taking  . warfarin (COUMADIN) 4 MG tablet Take 4 mg by mouth every other day. Alternate between 4mg  and 5mg    Taking  . warfarin (COUMADIN) 5 MG tablet Take 5 mg by mouth every other day. Alternate between 4mg  and 5mg    Taking   Assessment: [age] yoM on chronic Coumadin PTA for Afib.  INR was therapeutic at upper end of goal range on admission. He was admitted with UGIB and Coumadin was reversed with total 5mg  Vit K + 1 unit FFP. S/P EGD on 11/14.  Coumadin is being held and pt remains on Lovenox. Pt has SBO. No active bleeding  noted. CBC from 11/20 was stable.  Pt has recently gained weight up to 70 kg and will need to adjust enoxaparin dose appropriately  Goal of Therapy:  Lovenox bridge until Coumadin can be resumed. Monitor platelets by anticoagulation protocol: Yes  Plan:  Increase Lovenox to 70mg  sq q12h (1mg /kg/dose) Monitor for s/sx of bleeding  Arlean Hoppingorey M. Newman PiesBall, PharmD Clinical Pharmacist Pager [phone] 07/19/2014 3:07 PM

## 2014-07-19 NOTE — Progress Notes (Signed)
Subjective:  Lying in bed.  Not SOB.  No chest pain  Objective:  Vital Signs in the last 24 hours: BP 125/76 mmHg  Pulse 100  Temp(Src) 98.7 F (37.1 C) (Oral)  Resp 18  Ht 6\' 1"  (1.854 m)  Wt 70.031 kg (154 lb 6.3 oz)  BMI 20.37 kg/m2  SpO2 97%  Physical Exam: Elderly white male with neck brace and NG tube in place, currently in no acute distress Lungs:  Somewhat difficult exam due to the hardware on his upper chest, but no rales heard Cardiac:  Regular rhythm, normal S1 and S2, no S3 Abdomen:  Soft, nontender, no masses Extremities:  No edema present  Intake/Output from previous day: 11/21 0701 - 11/22 0700 In: 5623.8 [I.V.:1841.3; NG/GT:2000; TPN:1782.6] Out: 700 [Urine:700] Weight Filed Weights   07/17/14 0647 07/18/14 0400 07/19/14 0453  Weight: 65.953 kg (145 lb 6.4 oz) 66.4 kg (146 lb 6.2 oz) 70.031 kg (154 lb 6.3 oz)    Lab Results: Basic Metabolic Panel:  Recent Labs  21/30/8611/21/15 0412 07/19/14 0502  NA 135* 136*  K 4.3 4.1  CL 93* 97  CO2 28 26  GLUCOSE 119* 139*  BUN 23 23  CREATININE 1.01 0.81    CBC:  Recent Labs  07/17/14 0445  WBC 9.2  NEUTROABS 6.4  HGB 13.5  HCT 41.6  MCV 98.8  PLT 254    BNP    Component Value Date/Time   PROBNP 3979.0* 2013-12-18 0939    PROTIME: Lab Results  Component Value Date   INR 1.20 07/19/2014   INR 1.27 07/18/2014   INR 1.27 07/17/2014    Telemetry: Atrial fibrillation with controlled response.    Assessment/Plan:  1.  Controlled atrial fibrillation. 2.  History of cardiomyopathy -  May be getting ahead volume wise 3.  ICD currently in place. 4.  Small bowel obstruction awaiting resolution 5. Anticoagulation with lovenox bridge  Recommendations:  He is getting a lot of IV fluids and weight is up today.  I would cut back his normal saline for volume issues. W?u for falls when over acute illness       W. Ashley RoyaltySpencer Marquinn Meschke, Jr.  MD [facility] Card[facility]  07/19/2014, 7:44 AM

## 2014-07-19 NOTE — Progress Notes (Signed)
PARENTERAL NUTRITION CONSULT NOTE - FOLLOW UP  Pharmacy Consult for TPN Indication: SBO  No Known Allergies  Patient Measurements: Height: 6' 1"  (185.4 cm) Weight: 154 lb 6.3 oz (70.031 kg) IBW/kg (Calculated) : 79.9 Adjusted Body Weight:  Usual Weight:   Vital Signs: Temp: 98.7 F (37.1 C) (11/22 0453) Temp Source: Oral (11/22 0453) BP: 125/76 mmHg (11/22 0453) Pulse Rate: 100 (11/22 0453) Intake/Output from previous day: 11/21 0701 - 11/22 0700 In: 5623.8 [I.V.:1841.3; NG/GT:2000; TPN:1782.6] Out: 700 [Urine:700] Intake/Output from this shift: Total I/O In: 0  Out: 326 [Urine:325; Stool:1]  Labs:  Recent Labs  07/17/14 0445 07/18/14 0412 07/19/14 0502  WBC 9.2  --   --   HGB 13.5  --   --   HCT 41.6  --   --   PLT 254  --   --   INR 1.27 1.27 1.20     Recent Labs  07/17/14 0445 07/18/14 0412 07/19/14 0502  NA 135* 135* 136*  K 4.3 4.3 4.1  CL 93* 93* 97  CO2 31 28 26   GLUCOSE 131* 119* 139*  BUN 16 23 23   CREATININE 1.05 1.01 0.81  CALCIUM 9.6 9.2 8.9  MG 1.7  --   --   PHOS 3.3  --   --   PROT 6.8  --   --   ALBUMIN 3.0*  --   --   AST 21  --   --   ALT 16  --   --   ALKPHOS 150*  --   --   BILITOT 2.3*  --   --   PREALBUMIN 8.4*  --   --   TRIG 82  --   --    Estimated Creatinine Clearance: 61.2 mL/min (by C-G formula based on Cr of 0.81).    Recent Labs  07/19/14 0442 07/19/14 0811 07/19/14 1116  GLUCAP 151* 132* 135*    Medications:  Scheduled:  . sodium chloride   Intravenous Once  . antiseptic oral rinse  7 mL Mouth Rinse BID  . enoxaparin (LOVENOX) injection  60 mg Subcutaneous Q12H  . insulin aspart  0-9 Units Subcutaneous 6 times per day  . ondansetron (ZOFRAN) IV  4 mg Intravenous 4 times per day  . pantoprazole (PROTONIX) IV  40 mg Intravenous Q12H  . tobramycin  1 drop Both Eyes 4 times per day    Insulin Requirements in the past 24 hours:  3 units sens SSI, no insulin in TPN  Current Nutrition:   NPO  Nutritional Goals:  1900-2100 kCal, 90-100 grams of protein per day per RD recommendations 11/17  Admit: 83 YOM transferred from [facility]- originally brought in for GIB and after the bleeding stopped, he was unable to pass flatus or BM and an SBO was noted on CT and XR. Too high risk for surgery at [facility] and transferred here. Repeating films  GI: No NG output 1900/24h, but min since MN per surgeon note (not in I/O). Abd non-tender, non-distended with hypoactive BS, (-)flatus. On IV PPI and scheduled Zofran currently.  Endo: CBGs 124-151  Lytes: Na 136. K wnl.  Renal: Cr < 1, UOP 700 yest  Pulm: [place]-3L  Cards: Cardiomyopathy, ICD, hx AFib and systolic HF; warfarin is being held- continue to hold- on full dose Lovenox. BP soft-nml, HR nml-tachy  Hepatobil: Alk Phos 150, o/w LFTs wnl. Albumin low at 3.  Neuro: s/p fall with broken neck, (+)epidural hematoma. (+)thoracic spine fractures.- s/p surgery 9/15 at  [facility] & wearing extensive cervical collar.  Episode of convulsive syncope on 11/20  ID: AFeb, WBC wnl, on no abx.  Best Practices: MC, Lovenox  TPN Access: PICC- double lumen TPN day#: 11/19 >>  Plan:  1. Clinimix E 5/15 to goal rate 58m/hr + 20% fat emulsion at 1107mhr- this will provide 96g protein and 1894kcal 2. Multivitamin and trace elements in TPN 3. D/C SSI if cbgs remain controlled 4. [facility]. TPN labs as ordered on Mon,Thurs.  6. Follow for RD recommendations and signs of refeeding  KeGracy BruinsPharmD Clinical Pharmacist [facility]

## 2014-07-19 NOTE — Progress Notes (Signed)
8 Days Post-Op  Subjective: Denies flatus, denies abd pain; NG came out again yesterday but reinserted with immediate return of 1L; total of 1900cc/24hrs; minimal after midnight  Objective: Vital signs in last 24 hours: Temp:  [98.3 F (36.8 C)-98.7 F (37.1 C)] 98.7 F (37.1 C) (11/22 0453) Pulse Rate:  [100-101] 100 (11/22 0453) Resp:  [16-18] 18 (11/22 0453) BP: (115-125)/(64-76) 125/76 mmHg (11/22 0453) SpO2:  [97 %] 97 % (11/22 0453) Weight:  [154 lb 6.3 oz (70.031 kg)] 154 lb 6.3 oz (70.031 kg) (11/22 0453) Last BM Date: 07/12/14  Intake/Output from previous day: 11/21 0701 - 11/22 0700 In: 5623.8 [I.V.:1841.3; NG/GT:2000; TPN:1782.6] Out: 700 [Urine:700] Intake/Output this shift: Total I/O In: -  Out: 325 [Urine:325]  Frail elderly WM, nontoxic, in brace Soft, nt, mild distension No LE edema 1900cc light bilious fluid in cannister  Lab Results:   Recent Labs  07/17/14 0445  WBC 9.2  HGB 13.5  HCT 41.6  PLT 254   BMET  Recent Labs  07/18/14 0412 07/19/14 0502  NA 135* 136*  K 4.3 4.1  CL 93* 97  CO2 28 26  GLUCOSE 119* 139*  BUN 23 23  CREATININE 1.01 0.81  CALCIUM 9.2 8.9   PT/INR  Recent Labs  07/18/14 0412 07/19/14 0502  LABPROT 16.0* 15.3*  INR 1.27 1.20   ABG No results for input(s): PHART, HCO3 in the last 72 hours.  Invalid input(s): PCO2, PO2  Studies/Results: Dg Ugi W/small Bowel  07/17/2014   CLINICAL DATA:  Small bowel obstruction.  EXAM: UPPER GI SERIES WITH SMALL BOWEL FOLLOW-THROUGH  FLUOROSCOPY TIME:  2 min and 30 seconds  TECHNIQUE: Combined double contrast and single contrast upper GI series using effervescent crystals, thick barium, and thin barium. Subsequently, serial images of the small bowel were obtained including spot views of the terminal ileum.  COMPARISON:  CT scan 09-29-2013  FINDINGS: The existing NG tube was injected with Omnipaque 300. The stomach appears normal. Normal emptying into a normal caliber  duodenum. Slow progression of contrast through the small bowel which is mildly dilated. Transition from mildly dilated to nondilated/decompressed small bowel noted in the mid pelvic area likely due to adhesions and consistent with the prior CT findings. Contrast did reach the colon at approximately 11 and 30 min.  IMPRESSION: Persistent partial small bowel obstruction as above.   Electronically Signed   By: Loralie ChampagneMark  Gallerani M.D.   On: 07/17/2014 09:50    Anti-infectives: Anti-infectives    None      Assessment/Plan: s/p Procedure(s): ESOPHAGOGASTRODUODENOSCOPY (EGD) (N/A) pSBO - no fever. No abd pain. NG output minimal overnight. Cont bowel rest, ng tube to LIWS, will repeat xrays Monday.   Mary SellaEric M. Andrey CampanileWilson, MD, FACS General, Bariatric, & Minimally Invasive Surgery [facility], [place]   LOS: 10 days    Atilano InaWILSON,Vishwa Dais M 07/19/2014

## 2014-07-20 ENCOUNTER — Inpatient Hospital Stay (HOSPITAL_COMMUNITY): Payer: Medicare Other

## 2014-07-20 ENCOUNTER — Inpatient Hospital Stay (HOSPITAL_COMMUNITY): Payer: Medicare Other | Admitting: Certified Registered"

## 2014-07-20 ENCOUNTER — Encounter (HOSPITAL_COMMUNITY): Payer: Self-pay | Admitting: Physician Assistant

## 2014-07-20 DIAGNOSIS — I472 Ventricular tachycardia: Secondary | ICD-10-CM

## 2014-07-20 DIAGNOSIS — R4189 Other symptoms and signs involving cognitive functions and awareness: Secondary | ICD-10-CM

## 2014-07-20 DIAGNOSIS — R404 Transient alteration of awareness: Secondary | ICD-10-CM

## 2014-07-20 DIAGNOSIS — S129XXS Fracture of neck, unspecified, sequela: Secondary | ICD-10-CM

## 2014-07-20 DIAGNOSIS — I4729 Other ventricular tachycardia: Secondary | ICD-10-CM | POA: Insufficient documentation

## 2014-07-20 DIAGNOSIS — I481 Persistent atrial fibrillation: Secondary | ICD-10-CM

## 2014-07-20 DIAGNOSIS — J9601 Acute respiratory failure with hypoxia: Secondary | ICD-10-CM

## 2014-07-20 DIAGNOSIS — I5033 Acute on chronic diastolic (congestive) heart failure: Secondary | ICD-10-CM | POA: Insufficient documentation

## 2014-07-20 LAB — CBC
HEMATOCRIT: 36.3 % — AB (ref 39.0–52.0)
HEMOGLOBIN: 12.1 g/dL — AB (ref 13.0–17.0)
MCH: 32.6 pg (ref 26.0–34.0)
MCHC: 33.3 g/dL (ref 30.0–36.0)
MCV: 97.8 fL (ref 78.0–100.0)
Platelets: 222 10*3/uL (ref 150–400)
RBC: 3.71 MIL/uL — ABNORMAL LOW (ref 4.22–5.81)
RDW: 15.7 % — ABNORMAL HIGH (ref 11.5–15.5)
WBC: 11 10*3/uL — ABNORMAL HIGH (ref 4.0–10.5)

## 2014-07-20 LAB — DIFFERENTIAL
Basophils Absolute: 0 10*3/uL (ref 0.0–0.1)
Basophils Relative: 0 % (ref 0–1)
EOS ABS: 0 10*3/uL (ref 0.0–0.7)
EOS PCT: 0 % (ref 0–5)
Lymphocytes Relative: 11 % — ABNORMAL LOW (ref 12–46)
Lymphs Abs: 1.2 10*3/uL (ref 0.7–4.0)
MONOS PCT: 15 % — AB (ref 3–12)
Monocytes Absolute: 1.6 10*3/uL — ABNORMAL HIGH (ref 0.1–1.0)
Neutro Abs: 8.1 10*3/uL — ABNORMAL HIGH (ref 1.7–7.7)
Neutrophils Relative %: 74 % (ref 43–77)

## 2014-07-20 LAB — COMPREHENSIVE METABOLIC PANEL
ALT: 18 U/L (ref 0–53)
AST: 27 U/L (ref 0–37)
Albumin: 2.7 g/dL — ABNORMAL LOW (ref 3.5–5.2)
Alkaline Phosphatase: 157 U/L — ABNORMAL HIGH (ref 39–117)
Anion gap: 10 (ref 5–15)
BILIRUBIN TOTAL: 2.3 mg/dL — AB (ref 0.3–1.2)
BUN: 22 mg/dL (ref 6–23)
CHLORIDE: 97 meq/L (ref 96–112)
CO2: 27 meq/L (ref 19–32)
CREATININE: 0.64 mg/dL (ref 0.50–1.35)
Calcium: 9 mg/dL (ref 8.4–10.5)
GFR calc Af Amer: >90 mL/min (ref >90)
GFR calc non Af Amer: 84 mL/min — ABNORMAL LOW (ref >90)
Glucose, Bld: 142 mg/dL — ABNORMAL HIGH (ref 70–99)
Potassium: 3.7 mEq/L (ref 3.7–5.3)
Sodium: 134 mEq/L — ABNORMAL LOW (ref 137–147)
Total Protein: 6.3 g/dL (ref 6.0–8.3)

## 2014-07-20 LAB — POCT I-STAT 3, ART BLOOD GAS (G3+)
ACID-BASE EXCESS: 4 mmol/L — AB (ref 0.0–2.0)
Acid-Base Excess: 7 mmol/L — ABNORMAL HIGH (ref 0.0–2.0)
Bicarbonate: 31 mEq/L — ABNORMAL HIGH (ref 20.0–24.0)
Bicarbonate: 28.3 mEq/L — ABNORMAL HIGH (ref 20.0–24.0)
O2 SAT: 93 %
O2 Saturation: 99 %
PCO2 ART: 30 mmHg — AB (ref 35.0–45.0)
PO2 ART: 72 mmHg — AB (ref 80.0–100.0)
PO2 ART: 103 mmHg — AB (ref 80.0–100.0)
Patient temperature: 98
TCO2: 33 mmol/L (ref 0–100)
TCO2: 29 mmol/L (ref 0–100)
pCO2 arterial: 55 mmHg — ABNORMAL HIGH (ref 35.0–45.0)
pH, Arterial: 7.359 (ref 7.350–7.450)
pH, Arterial: 7.581 — ABNORMAL HIGH (ref 7.350–7.450)

## 2014-07-20 LAB — GLUCOSE, CAPILLARY
GLUCOSE-CAPILLARY: 137 mg/dL — AB (ref 70–99)
Glucose-Capillary: 133 mg/dL — ABNORMAL HIGH (ref 70–99)
Glucose-Capillary: 142 mg/dL — ABNORMAL HIGH (ref 70–99)
Glucose-Capillary: 144 mg/dL — ABNORMAL HIGH (ref 70–99)
Glucose-Capillary: 145 mg/dL — ABNORMAL HIGH (ref 70–99)
Glucose-Capillary: 154 mg/dL — ABNORMAL HIGH (ref 70–99)

## 2014-07-20 LAB — PROTIME-INR
INR: 1.24 (ref 0.00–1.49)
Prothrombin Time: 15.7 seconds — ABNORMAL HIGH (ref 11.6–15.2)

## 2014-07-20 LAB — PHOSPHORUS: Phosphorus: 2.3 mg/dL (ref 2.3–4.6)

## 2014-07-20 LAB — TRIGLYCERIDES: Triglycerides: 54 mg/dL (ref <150)

## 2014-07-20 LAB — MAGNESIUM: MAGNESIUM: 1.7 mg/dL (ref 1.5–2.5)

## 2014-07-20 LAB — PREALBUMIN: Prealbumin: 8.1 mg/dL — ABNORMAL LOW (ref 17.0–34.0)

## 2014-07-20 MED ORDER — FUROSEMIDE 10 MG/ML IJ SOLN
20.0000 mg | Freq: Every day | INTRAMUSCULAR | Status: DC
  Administered 2014-07-20 – 2014-07-24 (×5): 20 mg via INTRAVENOUS
  Filled 2014-07-20 (×7): qty 2

## 2014-07-20 MED ORDER — TRACE MINERALS CR-CU-F-FE-I-MN-MO-SE-ZN IV SOLN
INTRAVENOUS | Status: AC
  Administered 2014-07-20: 17:00:00 via INTRAVENOUS
  Filled 2014-07-20 (×2): qty 2000

## 2014-07-20 MED ORDER — CHLORHEXIDINE GLUCONATE 0.12 % MT SOLN
15.0000 mL | Freq: Two times a day (BID) | OROMUCOSAL | Status: DC
  Administered 2014-07-21 – 2014-07-24 (×8): 15 mL via OROMUCOSAL
  Filled 2014-07-20 (×9): qty 15

## 2014-07-20 MED ORDER — FUROSEMIDE 10 MG/ML IJ SOLN: 20.0000 mg | Freq: Two times a day (BID) | INTRAMUSCULAR | Status: DC

## 2014-07-20 MED ORDER — SODIUM CHLORIDE 0.9 % IV SOLN
15.0000 mmol | Freq: Once | INTRAVENOUS | Status: AC
  Administered 2014-07-20: 15 mmol via INTRAVENOUS
  Filled 2014-07-20: qty 5

## 2014-07-20 MED ORDER — METOPROLOL TARTRATE 1 MG/ML IV SOLN
2.5000 mg | Freq: Four times a day (QID) | INTRAVENOUS | Status: DC
  Administered 2014-07-20 – 2014-07-22 (×8): 2.5 mg via INTRAVENOUS
  Filled 2014-07-20 (×10): qty 5

## 2014-07-20 MED ORDER — PROPOFOL 10 MG/ML IV EMUL: 0.0000 ug/kg/min | INTRAVENOUS | Status: DC

## 2014-07-20 MED ORDER — FENTANYL CITRATE 0.05 MG/ML IJ SOLN
INTRAMUSCULAR | Status: AC
  Filled 2014-07-20: qty 2

## 2014-07-20 MED ORDER — CETYLPYRIDINIUM CHLORIDE 0.05 % MT LIQD
7.0000 mL | Freq: Four times a day (QID) | OROMUCOSAL | Status: DC
  Administered 2014-07-21 – 2014-07-24 (×16): 7 mL via OROMUCOSAL

## 2014-07-20 MED ORDER — FAT EMULSION 20 % IV EMUL
250.0000 mL | INTRAVENOUS | Status: AC
  Administered 2014-07-20: 250 mL via INTRAVENOUS
  Filled 2014-07-20 (×2): qty 250

## 2014-07-20 MED ORDER — MAGNESIUM SULFATE 2 GM/50ML IV SOLN
2.0000 g | Freq: Once | INTRAVENOUS | Status: AC
  Administered 2014-07-20: 2 g via INTRAVENOUS
  Filled 2014-07-20: qty 50

## 2014-07-20 MED ORDER — FENTANYL CITRATE 0.05 MG/ML IJ SOLN
50.0000 ug | INTRAMUSCULAR | Status: DC | PRN
  Administered 2014-07-23: 50 ug via INTRAVENOUS
  Filled 2014-07-20: qty 2

## 2014-07-20 MED ORDER — MIDAZOLAM HCL 2 MG/2ML IJ SOLN
INTRAMUSCULAR | Status: AC
  Filled 2014-07-20: qty 2

## 2014-07-20 MED ORDER — GLYCOPYRROLATE 0.2 MG/ML IJ SOLN
0.1000 mg | Freq: Two times a day (BID) | INTRAMUSCULAR | Status: DC
  Administered 2014-07-21 – 2014-07-23 (×6): 0.1 mg via INTRAVENOUS
  Filled 2014-07-20 (×8): qty 0.5

## 2014-07-20 MED ORDER — PROPOFOL 10 MG/ML IV EMUL
INTRAVENOUS | Status: AC
  Administered 2014-07-20: 30 ug/kg/min via INTRAVENOUS
  Filled 2014-07-20: qty 100

## 2014-07-20 MED ORDER — SUCCINYLCHOLINE CHLORIDE 20 MG/ML IJ SOLN
INTRAMUSCULAR | Status: DC | PRN
  Administered 2014-07-20: 100 mg via INTRAVENOUS

## 2014-07-20 MED ORDER — MAGNESIUM SULFATE 2 GM/50ML IV SOLN: 2.0000 g | Freq: Once | INTRAVENOUS | Status: DC

## 2014-07-20 MED ORDER — ETOMIDATE 2 MG/ML IV SOLN
INTRAVENOUS | Status: DC | PRN
  Administered 2014-07-20: 10 mg via INTRAVENOUS

## 2014-07-20 NOTE — Procedures (Signed)
ELECTROENCEPHALOGRAM REPORT   Patient: Mark Day       Room #: 3E10 EEG No. ID: [id number] Age: 80 y.o.        Sex: male Referring Physician: Rizwan Report Date:  07/20/2014        Interpreting Physician: Thana FarrEYNOLDS,Jarom Govan D  History: Mark Day is an 80 y.o. male s/p an episode of hypotension followed by a short period of arm shaking and unresponsiveness.  Medications:  Scheduled: . antiseptic oral rinse  7 mL Mouth Rinse BID  . enoxaparin (LOVENOX) injection  1 mg/kg Subcutaneous Q12H  . insulin aspart  0-9 Units Subcutaneous 6 times per day  . magnesium sulfate 1 - 4 g bolus IVPB  2 g Intravenous Once  . ondansetron (ZOFRAN) IV  4 mg Intravenous 4 times per day  . pantoprazole (PROTONIX) IV  40 mg Intravenous Q12H  . sodium phosphate  Dextrose 5% IVPB  15 mmol Intravenous Once  . tobramycin  1 drop Both Eyes 4 times per day    Conditions of Recording:  This is a 16 channel EEG carried out with the patient in the awake and drowsy states.  Description:  The waking background activity consists of a low voltage, symmetrical, fairly well organized, 7.5 Hz alpha activity, seen from the parieto-occipital and posterior temporal regions.  Low voltage fast activity, poorly organized, is seen anteriorly and is at times superimposed on more posterior regions.  A mixture of theta and alpha rhythms are seen from the central and temporal regions. The patient drowses with slowing to irregular, low voltage theta and beta activity.   No epileptiform activity is noted.   Stage II sleep is not obtained. Hyperventilation and intermittent photic stimulation were not performed.   IMPRESSION: This is a mildly abnormal EEG secondary to mild posterior background slowing.  This finding may be seen with a mild diffuse gray matter disturbance that is etiologically nonspecific, but may include a dementia, among other possibilities.  No epileptiform activity is noted.     Thana FarrLeslie Naim Murtha,  MD [facility] [phone] 07/20/2014, 11:35 AM

## 2014-07-20 NOTE — Progress Notes (Signed)
Patient has orders to transfer to ICU.

## 2014-07-20 NOTE — Consult Note (Signed)
PULMONARY / CRITICAL CARE MEDICINE   Name: Mark Day MRN: [id number] DOB: 12/10/1922    ADMISSION DATE:  07/10/2014 CONSULTATION DATE:  07/20/2014  REFERRING MD :  Butler Denmarkizwan  CHIEF COMPLAINT:  SBO  INITIAL PRESENTATION:  80 y.o. M admitted 11/12 with SBO.  Attempt made to monitor with conservative treatment; however, obstruction not resolving.  On evening of 11/23, family and primary team concerned about pt's ability to protect airway so transferred to ICU. He suffered a fall with an epidural hematoma, a spinal fracture in the C-spine for which he has a  Brace -was at [facility], then [facility] He also has AICD, a fibn on coumadin   STUDIES:  11/12 CT abd/pelv >>> changes c/w SBO, changes highly suggestive of bilateral pulmonary emboli, multiple gallstones, chronic renal cystic change, diverticulosis. 111/2 CT head >>> acute sinusitis 11/23 Abd >>> persistent low grade partial SBO 11/23 EEG >>> mildly abnormal, no epileptiform activity.  SIGNIFICANT EVENTS: 11/12 - admit 11/19 - cards and general surgery consult 11/23- transfer to ICU   HISTORY OF PRESENT ILLNESS:  Pt not able to communicate well; therefore, this HPI is obtained from chart review. Mark Day is an 80 y.o. M with PMH as outlined below.  He was admitted on 11/12 with a SBO which was treated conservatively.  As of 11/23, SBO non-resolving so plans to possibly go to OR 11/24 however pt's wife wanted to discuss with her sons first. On evening of 11/23, family was concerned that pt was not able to clear his secretions.  They notified primary team and requested that he be transferred to the ICU.  Hence PCCM was consulted.  Of note, [facility] a fall at home, suffered epidural hematoma with cervical and thoracic fractures (now in a collar).   PAST MEDICAL HISTORY :   has a past medical history of Osteoarthritis; Chronic diarrhea; Hypertension; Fracture of  vertebra (10/2013); Epidural hematoma (04/2014); Chronic systolic heart failure (04/2014); AICD (automatic cardioverter/defibrillator) present; Atrial fibrillation; Multiple fractures of thoracic spine (04/2014); Cervical spine fracture (04/2014); Cholelithiasis (07/27/2014); Tremor of both hands; Chronic anticoagulation; Pulmonary hypertension (04/2014); Atrial dilatation, bilateral (04/2014); and Duodenitis with bleeding (07/05/2014).  has past surgical history that includes Prostatectomy; Colonoscopy; Neck surgery; Esophagogastroduodenoscopy (N/A, 07/04/2014); and Cardiac defibrillator placement (2007). Prior to Admission medications   Medication Sig Start Date End Date Taking? Authorizing Provider  ALPRAZolam (XANAX) 0.25 MG tablet Take 0.125 mg by mouth every 6 (six) hours as needed for anxiety.    Yes Historical Provider, MD  amoxicillin-clavulanate (AUGMENTIN) 875-125 MG per tablet Take 1 tablet by mouth 2 (two) times daily. Starting 07/06/2014 and ending 07/10/2014.   Yes Historical Provider, MD  aspirin 81 MG tablet Take 81 mg by mouth daily.   Yes Historical Provider, MD  atorvastatin (LIPITOR) 10 MG tablet Take 10 mg by mouth daily.   Yes Historical Provider, MD  escitalopram (LEXAPRO) 10 MG tablet Take 10 mg by mouth daily.   Yes Historical Provider, MD  lisinopril (PRINIVIL,ZESTRIL) 10 MG tablet Take 10 mg by mouth daily.   Yes Historical Provider, MD  metoprolol (LOPRESSOR) 50 MG tablet Take 50 mg by mouth 2 (two) times daily.   Yes Historical Provider, MD  warfarin (COUMADIN) 6 MG tablet Take 6 mg by mouth daily.   Yes Historical Provider, MD  docusate sodium (COLACE) 100 MG capsule Take 200 mg by mouth daily. 07/29/13   Malissa HippoNajeeb U Rehman, MD  warfarin (COUMADIN)  4 MG tablet Take 4 mg by mouth every other day. Alternate between 4mg  and 5mg     Historical Provider, MD  warfarin (COUMADIN) 5 MG tablet Take 5 mg by mouth every other day. Alternate between 4mg  and 5mg     Historical Provider, MD   No  Known Allergies  FAMILY HISTORY:  Family History  Problem Relation Age of Onset  . Healthy Brother   . Healthy Son   . Healthy Son     SOCIAL HISTORY:  reports that he has never smoked. He has never used smokeless tobacco. He reports that he drinks alcohol. He reports that he does not use illicit drugs.  REVIEW OF SYSTEMS:  Unable to obtain.  SUBJECTIVE:   VITAL SIGNS: Temp:  [98.6 F (37 C)-99.7 F (37.6 C)] 98.6 F (37 C) (11/23 1400) Pulse Rate:  [88-114] 105 (11/23 1400) Resp:  [20-26] 22 (11/23 1400) BP: (112-140)/(70-81) 112/77 mmHg (11/23 1400) SpO2:  [94 %-97 %] 96 % (11/23 1802) Weight:  [67.1 kg (147 lb 14.9 oz)] 67.1 kg (147 lb 14.9 oz) (11/23 0549) HEMODYNAMICS:   VENTILATOR SETTINGS:   INTAKE / OUTPUT: Intake/Output      11/23 0701 - 11/24 0700   IV Piggyback 50   TPN 1024   Total Intake(mL/kg) 1074 (16)   Urine (mL/kg/hr) 200 (0.2)   Emesis/NG output 400 (0.5)   Stool    Total Output 600   Net +474         PHYSICAL EXAMINATION: General: Elderly chronically ill appearing male, in NAD. Neuro: A&O x 3, non-focal, speech difficulties.  MAE's.  Weak cough. HEENT: Neck collar in place.  Cardiovascular: RRR, no M/R/G.  Lungs: Respirations even and unlabored.  Coarse bilaterally.  On 2L O2 via [place]. Abdomen: BS x 4, soft, mildly tender.  Musculoskeletal: No gross deformities, no edema.  Bilateral UE resting tremor. Skin: Intact, warm, no rashes.  LABS:  CBC  Recent Labs Lab 07/15/14 0518 07/17/14 0445 07/20/14 0510  WBC 7.5 9.2 11.0*  HGB 13.4 13.5 12.1*  HCT 39.8 41.6 36.3*  PLT 222 254 222   Coag's  Recent Labs Lab 07/18/14 0412 07/19/14 0502 07/20/14 0510  INR 1.27 1.20 1.24   BMET  Recent Labs Lab 07/18/14 0412 07/19/14 0502 07/20/14 0510  NA 135* 136* 134*  K 4.3 4.1 3.7  CL 93* 97 97  CO2 28 26 27   BUN 23 23 22   CREATININE 1.01 0.81 0.64  GLUCOSE 119* 139* 142*   Electrolytes  Recent Labs Lab 07/17/14 0445  07/18/14 0412 07/19/14 0502 07/20/14 0510  CALCIUM 9.6 9.2 8.9 9.0  MG 1.7  --   --  1.7  PHOS 3.3  --   --  2.3   Sepsis Markers No results for input(s): LATICACIDVEN, PROCALCITON, O2SATVEN in the last 168 hours. ABG No results for input(s): PHART, PCO2ART, PO2ART in the last 168 hours. Liver Enzymes  Recent Labs Lab 07/17/14 0445 07/20/14 0510  AST 21 27  ALT 16 18  ALKPHOS 150* 157*  BILITOT 2.3* 2.3*  ALBUMIN 3.0* 2.7*   Cardiac Enzymes No results for input(s): TROPONINI, PROBNP in the last 168 hours. Glucose  Recent Labs Lab 07/19/14 1953 07/20/14 0005 07/20/14 0448 07/20/14 0810 07/20/14 1137 07/20/14 1611  GLUCAP 142* 133* 142* 137* 144* 145*    Imaging No results found.    ASSESSMENT / PLAN:  PULMONARY A: Hypoxic respiratory failure PAH - PAP 58 on echo from 11/22 At risk aspiration / concern for  occult aspiration At risk intubation - though protecting airway on own at the moment P:   Continue supplemental O2 as needed to maintain SpO2 88 - 92%. Continue robinul if heavy secretions. Pulmonary hygiene. If declines will need intubation. CXR intermittently.  CARDIOVASCULAR R PICC 11/19 >>> A:  Chronic A.fib - warfarin on hold Hx cardiomyopathy - ICD in place LV systolic dysfunction - 11/22 echo EF 50-55% HTN P:  Cards following. Continue to hold warfarin (switched to lovenox). Continue diltiazem,lasix, lopressor.  RENAL A:   N acute issues P:   NS @ KVO. BMP in AM.  GASTROINTESTINAL NG 11/16 >>> A:   SBO Duodenitis with bleeding - resolved Nausea / Vomiting Cholelithiasis Nutrition GI prophylaxis P:   SBO management per CCS. Zofran. Continue TPN and intralipid. SUP: pantoprazole.  HEMATOLOGIC A:   Coagulopathy - warfarin held, now resolved Anemia  VTE Prophylaxis P:  Continue to hold warfarin. Transfuse per usual ICU guidelines. SCD's / Lovenox. CBC in AM.  INFECTIOUS A:   No evidence of infection P:    Monitor clinically.  ENDOCRINE A:   ? DM - on SSI during this hospitalization P:   CBG's q4hr. SSI.  NEUROLOGIC A:   ? Convulsive syncope - believed to be secondary to hypotension per neuro Epidural hematoma and multiple cervical and thoracic spine fractures secondary to fall Anxiety Resting UE tremor P:   Neuro following. Continue brace, outpatient f/u with ortho after discharge. Morphine PRN pain. D/c ativan.   Family updated: None available.  Interdisciplinary Family Meeting v Palliative Care Meeting:  Due by: 11/30.   Rutherford Guysahul Desai, PA - [facility] Pgr: [phone]  or [phone]   ATTENDING NOTE: I have personally reviewed patient's available data, including medical history, events of note, physical examination and test results as part of my evaluation. I have discussed with resident/NP and other careteam providers such as pharmacist, RN and RRT & co-ordinated with consultants. In addition, I personally evaluated patient and elicited key history of recent falls with epidural hematoma, cervical spine fracture requiring brace, atrial fibrillation on Coumadin, this admission for small bowel obstruction not responding to conservative therapy, NG tube drainage, worsening respiratory status this PM requiring transfer to the ICU exam findings of following commands, generalized weakness, a weak cough but able to mobilize secretions & labs showing mild hyponatremia, mild leukocytosis.  Agree with conservative therapy, continue chest PT, mobilize secretions as best able. I see that a palliative care discussion is planned for 11/24. If family desires aggressive therapy, surgical option has been offered-but clearly this carries high risk of morbidity and mortality. Meanwhile if respiratory status worsened, we will proceed with intubation and mechanical ventilation Rest per NP/medical resident whose note is outlined above and that I agree with  and edited in full.   The patient is critically ill with multiple organ systems failure and requires high complexity decision making for assessment and support, frequent evaluation and titration of therapies, application of advanced monitoring technologies and extensive interpretation of multiple databases. Critical Care Time devoted to patient care services described in this note independent of PA time is 45 minutes.    Oretha MilchALVA,Xinyi Batton V. MD  07/20/2014, 7:36 PM

## 2014-07-20 NOTE — Progress Notes (Signed)
PARENTERAL NUTRITION CONSULT NOTE - FOLLOW UP  Pharmacy Consult:  TPN Indication: SBO  No Known Allergies  Patient Measurements: Height: 6' 1"  (185.4 cm) Weight: 147 lb 14.9 oz (67.1 kg) (with neck brace) IBW/kg (Calculated) : 79.9  Vital Signs: Temp Source: Oral (11/23 0549) BP: 136/81 mmHg (11/23 0826) Pulse Rate: 18 (11/23 0826) Intake/Output from previous day: 11/22 0701 - 11/23 0700 In: 0  Out: 1426 [Urine:1425; Stool:1]  Labs:  Recent Labs  07/18/14 0412 07/19/14 0502 07/20/14 0510  WBC  --   --  11.0*  HGB  --   --  12.1*  HCT  --   --  36.3*  PLT  --   --  222  INR 1.27 1.20 1.24     Recent Labs  07/18/14 0412 07/19/14 0502 07/20/14 0500 07/20/14 0510  NA 135* 136*  --  134*  K 4.3 4.1  --  3.7  CL 93* 97  --  97  CO2 28 26  --  27  GLUCOSE 119* 139*  --  142*  BUN 23 23  --  22  CREATININE 1.01 0.81  --  0.64  CALCIUM 9.2 8.9  --  9.0  MG  --   --   --  1.7  PHOS  --   --   --  2.3  PROT  --   --   --  6.3  ALBUMIN  --   --   --  2.7*  AST  --   --   --  27  ALT  --   --   --  18  ALKPHOS  --   --   --  157*  BILITOT  --   --   --  2.3*  TRIG  --   --  54  --    Estimated Creatinine Clearance: 59.4 mL/min (by C-G formula based on Cr of 0.64).    Recent Labs  07/20/14 0005 07/20/14 0448 07/20/14 0810  GLUCAP 133* 142* 137*     Insulin Requirements in the past 24 hours:  6 units sensitive SSI  Assessment: 12 YOM presented to [facility] with GIB and after the bleeding stopped, he was unable to pass flatus or have a BM.  CT and XR showed SBO and patient was transferred to [facility] for further care.  He continues on TPN for nutritional support.  GI: chronic diarrhea admitted with SBO, baseline prealbumin low at 8.4.  On IV PPI and scheduled Zofran Endo: no hx DM - CBGs controlled Lytes: worsening hyponatremia, others WNL (Mag and Phos low normal) Renal:SCr normal and stable, BUN WNL Pulm:decreased to 1L [place] Cards:HTN / CM / ICD / AFib /  sHF (EF 50-55%) - BP normal, variable HR - diltiazem gtt for Afib AC: full-dose Lovenox for Afib while Coumadin on hold, mild anemia, plts WNL - INR 1.24 Hepatobil: LFTs WNL except alk phos and tibili - no documentation of jaundice.  TG WNL. Neuro: s/p fall with broken neck, (+)epidural hematoma. (+)thoracic spine fractures.- s/p surgery 9/15 at [facility] & wearing extensive cervical collar.  Episode of convulsive syncope on 11/20.  EEG completed, f/u result. ID: afebrile, WBC mildly elevated - not on abx Best Practices: MC, Lovenox TPN Access: PICC TPN day#:4 (11/19 >> )  Current Nutrition:  TPN  Nutritional Goals:  1900-2100 kCal, 90-100 grams of protein per day   Plan:  - Continue Clinimix E 5/15 at 83 mL/hr and 20% IVFE at 30m/hr.  TPN provides  96g protein and 1894 kCal daily, meeting 100% of protein and 99% of minimal kCal need. - Daily multivitamin and trace elements.  Watch tibili. - Continue sensitive SSI for now - NaPhos 15 mmol IV x 1 - Mag sulfate 2gm IV x 1 - F/U AM labs    Gurbani Figge D. Mina Marble, PharmD, BCPS Pager:  [phone] 07/20/2014, 10:09 AM

## 2014-07-20 NOTE — Progress Notes (Signed)
Physical Therapy Treatment Patient Details Name: Mark Day MRN: [id number] DOB: 11/29/1922 Today's Date: 07/20/2014    History of Present Illness 80 y.o. male with a history of recent hospitalization at [facility] from September to October 2015 with treatment for cervical and thoracic fractures and epidural hematoma following a fall at home.  [facility], found to have small bowel obstruction; changes highly suggestive of bilateral pulmonary emboli; multiple gallstones without complicating factors, and diverticulosis without diverticulitis.     PT Comments    Patient very limited by WOB, Spo2 >93 % however patient appears gasping during session, suctioned with yonker with increased secretions.  Performed BLE PROM and assisted with EOB activities and dangling. BP assessed EOB patient 90s/50s.  Tolerated increased EOB time prior to transfer back to bed, assisted with repositioning.  Nsg notified of patients unwell look and poor activity tolerance as well as increased secretions.  Patient's family arrived and extremely concerned.    Follow Up Recommendations  SNF;Supervision/Assistance - 24 hour (plan to [facility])     Equipment Recommendations  None recommended by PT    Recommendations for Other Services       Precautions / Restrictions Precautions Precautions: Fall Precaution Comments: NG tube in place, ? spinal precautions?  Required Braces or Orthoses: Cervical Brace;Spinal Brace Cervical Brace: Hard collar;At all times Spinal Brace: Other (comment) Spinal Brace Comments: CTO Restrictions Weight Bearing Restrictions: No    Mobility  Bed Mobility Overal bed mobility: Needs Assistance;+2 for physical assistance Bed Mobility: Supine to Sit;Sit to Supine     Supine to sit: Max assist;+2 for physical assistance (helicopter technique) Sit to supine: Max assist;+2 for physical assistance   General bed mobility comments:  patient remains physically limited with all mobility, assist required from LE movement, trunk rotation and support to upright, assist for return to supine.   Transfers                    Ambulation/Gait                 Stairs            Wheelchair Mobility    Modified Rankin (Stroke Patients Only)       Balance   Sitting-balance support: Feet supported Sitting balance-Leahy Scale: Poor Sitting balance - Comments: patient with right lateral lean and posterior bias, required support to maintain upright.                            Cognition Arousal/Alertness: Awake/alert Behavior During Therapy: WFL for tasks assessed/performed Overall Cognitive Status: Impaired/Different from baseline Area of Impairment: Following commands;Safety/judgement;Awareness;Problem solving       Following Commands: Follows one step commands consistently Safety/Judgement: Decreased awareness of deficits Awareness: Intellectual Problem Solving: Slow processing;Decreased initiation;Difficulty sequencing;Requires verbal cues;Requires tactile cues General Comments: patient unable to recognize poor postural alighnment or demonstrate ability to correct without tactile cues    Exercises      General Comments General comments (skin integrity, edema, etc.): performed EOB activities for greater than 10 mins, PROM to bilateral LEs and heel cords.  While sitting EOB, performed suction with yonker       Pertinent Vitals/Pain      Home Living                      Prior Function  PT Goals (current goals can now be found in the care plan section) Acute Rehab PT Goals Patient Stated Goal: to walk again PT Goal Formulation: With patient Time For Goal Achievement: 07/31/14 Potential to Achieve Goals: Fair Progress towards PT goals: Not progressing toward goals - comment    Frequency  Min 2X/week    PT Plan Current plan remains appropriate     Co-evaluation             End of Session Equipment Utilized During Treatment: Gait belt;Back brace;Cervical collar Activity Tolerance: Treatment limited secondary to medical complications (Comment) (low BP) Patient left: in bed;with call bell/phone within reach     Time: 1355-1420 PT Time Calculation (min) (ACUTE ONLY): 25 min  Charges:  $Therapeutic Activity: 23-37 mins                    G CodesFabio Day:      Mark Day 07/20/2014, 3:37 PM Mark Day, PT DPT  [phone]

## 2014-07-20 NOTE — Progress Notes (Signed)
CSW received a written handoff report from Iris PertKara Stulz, LCSW- [facility] after [facility] was [facility].  [facility] is a current resident [facility] with plan to return to the SNF when medically stable.  Patient currently requires TPN- CSW received a call from Charlann Langeami Morris, [facility] who requested and update of patient's condition.  She stated that the SNF cannot accept patient if he requires ongoing TPN.  Patient noted to be moved to 55M-03 this evening. Will provide the 55M CSW a handoff of above information in the morning.  Lorri Frederickonna T. Jaci LazierCrowder, KentuckyLCSW  [phone]

## 2014-07-20 NOTE — Progress Notes (Signed)
Heart rate has been running over 105/min. Titrated cardizem to 7.345ml/hour. Patient was getting 725ml/hour. Respiratory suctioned patient through mouth, and patient was able to get up a moderate amount of secretions. Patient does not sound as congested as of now. Denies pain and discomfort. Will continue to monitor.

## 2014-07-20 NOTE — Progress Notes (Signed)
PT Cancellation Note  Patient Details Name: Mark Day MRN: [id number] DOB: 12/04/1922   Cancelled Treatment:    Reason Eval/Treat Not Completed: Patient at procedure or test/unavailable (patient of the floor at this time)   [name], [name] 07/20/2014, 10:20 AM [name], PT DPT  [phone]

## 2014-07-20 NOTE — Progress Notes (Signed)
Paged Dr. Butler Denmarkizwan to come to talk to patient's family(wife). Wife came to visit, and was very concerned about patient's condition. Dr. Butler Denmarkrizwan will see patient's family.

## 2014-07-20 NOTE — Progress Notes (Signed)
EEG completed, results pending. 

## 2014-07-20 NOTE — Progress Notes (Signed)
UR completed Sirena Riddle K. Arnez Stoneking, RN, BSN, MSHL, CCM  07/20/2014 12:24 PM

## 2014-07-20 NOTE — Progress Notes (Signed)
Patient's family seem to be upset due to the fact that palliative can not see them today. Family is requesting a sitter or they would like the patient to be moved to ICU. Im not dure if the family understands what palliative care is. Also rapid response nurse has been called to come assess the patient. Dr Butler Denmarkizwan has been notified about family request.

## 2014-07-20 NOTE — Progress Notes (Signed)
Patient has 17 beat run of vtach. Dr. Butler Denmarkizwan notified.

## 2014-07-20 NOTE — Progress Notes (Signed)
Palliative Medicine consult received at 430PM 11/23. Paged by RN stating that family was demanding immediate consultation. I spoke with Dr. Butler Denmarkizwan who confirmed that there is no urgent symptom management need for the patient. We have no available providers to respond to this family request. The earliest we can do a complete goals of care and facilitate family meeting is at 82830 tomorrow 11/24 AM. The nurse states that the patient is not having pain but is needing very frequent oral suction because he is "not swallowing his saliva". I have recommended using low dose IV robinul to dry those secretions. I will meet family at 63830 tomorrow AM. RN also stated that family was requesting that patient be transported to the ICU so there appears to be be conflicting requests and confusion about what Mark Day needs. Would recommend holding surgical interventions until family meeting can occur and that very clear expectations are set forth as well as a frank discussion about risk benefits and also a discussion about code status and QOL issues in case things do not do well.  Full consult to follow in AM.  Mark MaltaElizabeth Golding, DO Palliative Medicine [phone]

## 2014-07-20 NOTE — Progress Notes (Signed)
Patient Name: Mark Day Date of Encounter: 07/20/2014  Principal Problem:   Duodenitis with bleeding Active Problems:   Atrial fibrillation   AICD (automatic cardioverter/defibrillator) present - MDT   Acute on chronic systolic heart failure   Nausea and vomiting   Hematemesis   SBO (small bowel obstruction)   Multiple fractures of thoracic spine   Warfarin-induced coagulopathy   Cholelithiasis   Episode of unresponsiveness   Primary Cardiologist: Dr. Ladona Ridgelaylor  Patient Profile: 80 yo male w/ hx HTN, afib on coumadin, MDT ICD, DM, was admitted 11/19 from [facility] with ?SBO. On bowel rest.    SUBJECTIVE: Struggling a bit to breathe with secretions, congestion.   OBJECTIVE Filed Vitals:   07/20/14 0549 07/20/14 0826 07/20/14 0835 07/20/14 1040  BP: 137/70 136/81  139/72  Pulse: 113 109  112  Temp:      TempSrc: Oral     Resp: 22 20 22 22   Height:      Weight: 147 lb 14.9 oz (67.1 kg)     SpO2: 95% 94% 96% 97%    Intake/Output Summary (Last 24 hours) at 07/20/14 1209 Last data filed at 07/20/14 0853  Gross per 24 hour  Intake      0 ml  Output   1100 ml  Net  -1100 ml   Filed Weights   07/18/14 0400 07/19/14 0453 07/20/14 0549  Weight: 146 lb 6.2 oz (66.4 kg) 154 lb 6.3 oz (70.031 kg) 147 lb 14.9 oz (67.1 kg)    PHYSICAL EXAM General: Well developed, well nourished, male in no acute distress. Head: Normocephalic, atraumatic.  Neck: Supple without bruits, JVD 8-9 cm. Lungs:  Resp regular and mod labored, rhonchi, rales, upper airway wheeze. Heart: Irreg irreg, S1, S2, no S3, S4, or murmur; no rub. Abdomen: Soft, non-tender, ?slightly distended Extremities: No clubbing, cyanosis, no edema.  Neuro: Alert and oriented X 3. Moves all extremities spontaneously. Psych: Normal affect.  LABS: CBC:  Recent Labs  07/20/14 0510  WBC 11.0*  NEUTROABS 8.1*  HGB 12.1*  HCT 36.3*  MCV 97.8  PLT 222   INR:  Recent Labs  07/20/14 0510  INR 1.24    Basic Metabolic Panel:  Recent Labs  09/81/1911/22/15 0502 07/20/14 0510  NA 136* 134*  K 4.1 3.7  CL 97 97  CO2 26 27  GLUCOSE 139* 142*  BUN 23 22  CREATININE 0.81 0.64  CALCIUM 8.9 9.0  MG  --  1.7  PHOS  --  2.3   Liver Function Tests:  Recent Labs  07/20/14 0510  AST 27  ALT 18  ALKPHOS 157*  BILITOT 2.3*  PROT 6.3  ALBUMIN 2.7*   Fasting Lipid Panel:  Recent Labs  07/20/14 0500  TRIG 54   Lab Results  Component Value Date   INR 1.24 07/20/2014   INR 1.20 07/19/2014   INR 1.27 07/18/2014   TELE:   Atrial fib, RVR at times.    Echo: 07/19/2014 - Left ventricle: The cavity size was normal. Wall thickness was increased increased in a pattern of mild to moderate LVH. Systolic function was normal. The estimated ejection fraction was in the range of 50% to 55%. Regional wall motion abnormalities cannot be excluded. - Mitral valve: Mildly calcified annulus. There was mild to moderate regurgitation. - Left atrium: The atrium was severely dilated. - Right ventricle: The cavity size was dilated. Wall thickness was normal. Systolic function was mildly reduced. - Right atrium: The atrium was moderately  to severely dilated. - Tricuspid valve: There was mild-moderate regurgitation. - Pulmonary arteries: Systolic pressure was moderately increased. PA peak pressure: 58 mm Hg (S).  Dg Abd Acute W/chest 07/20/2014   CLINICAL DATA:  Subsequent evaluation for abdominal pain and distention current history of small bowel obstruction  EXAM: ACUTE ABDOMEN SERIES (ABDOMEN 2 VIEW & CHEST 1 VIEW)  COMPARISON:  07/16/2014  FINDINGS: Since the prior study oral contrast has progressed throughout most of the colon now seen at or at mid sigmoid colon. There is significant sigmoid diverticulosis. Similar to the prior study, there remain several loops of mildly distended small bowel up to a diameter of about 3.8 cm. Air is seen into the rectum.  There is no evidence of  pneumoperitoneum. There is mild bilateral lower lobe pulmonary parenchymal scarring or atelectasis. A right PICC line is identified with tip over the cavoatrial junction. An NG tube extends into the stomach. Single lead cardiac pacer is present along with moderate cardiac enlargement.  IMPRESSION: Persistent low-grade partial small bowel obstruction   Electronically Signed   By: Esperanza Heir M.D.   On: 07/20/2014 11:52     Current Medications:  . antiseptic oral rinse  7 mL Mouth Rinse BID  . enoxaparin (LOVENOX) injection  1 mg/kg Subcutaneous Q12H  . insulin aspart  0-9 Units Subcutaneous 6 times per day  . magnesium sulfate 1 - 4 g bolus IVPB  2 g Intravenous Once  . ondansetron (ZOFRAN) IV  4 mg Intravenous 4 times per day  . pantoprazole (PROTONIX) IV  40 mg Intravenous Q12H  . sodium phosphate  Dextrose 5% IVPB  15 mmol Intravenous Once  . tobramycin  1 drop Both Eyes 4 times per day   . sodium chloride 10 mL/hr at 07/19/14 1324  . diltiazem (CARDIZEM) infusion 7.5 mg/hr (07/20/14 0830)  . Marland KitchenTPN (CLINIMIX-E) Adult 83 mL/hr at 07/19/14 1801   And  . fat emulsion 240 mL (07/19/14 1801)  . Marland KitchenTPN (CLINIMIX-E) Adult     And  . fat emulsion      ASSESSMENT AND PLAN: Principal Problem:   Duodenitis with bleeding - per IM/CCS  Active Problems:   Atrial fibrillation - rate up at times secondary to acute illness, on IV Rx at 7.5 cc/hr, titrate PRN    AICD (automatic cardioverter/defibrillator) present - MDT - follow    Acute on chronic systolic heart failure - not currently on Lasix, CXR pending, f/u on results, some overload by exam, but also with congestion, extremely high risk for aspiration. RN aware, pt being suctioned regularly. CXR not much different. I/O net negative 3.2 L since admission.    Nausea and vomiting - per IM/CCS    Hematemesis - per IM/CCS    SBO (small bowel obstruction) - per IM/CCS    Multiple fractures of thoracic spine - per IM    Warfarin-induced  coagulopathy - coumadin held, on lovenox    Cholelithiasis - per IM/CCS    Episode of unresponsiveness - per IM/Neurology, had EEG  Signed, Theodore Demark , PA-C 12:09 PM 07/20/2014   The patient was seen, examined and discussed with Theodore Demark, PA-C and I agree with the above.   80 year old male with duodenitis with bleeding on TPN, with acute on chronic CHF. We will give a very careful lasix 20 mg iv BID and low dose of BB - very frequent ventricular ectopy on telemetry including couplets, triplets and 1 episode of ns VT - 7 beats.  Lars MassonELSON, Merrilyn Legler H 07/20/2014

## 2014-07-20 NOTE — Progress Notes (Signed)
Patient ID: Mark Day, male   DOB: 10/04/1922, 80 y.o.   MRN: [id number] 9 Days Post-Op  Subjective: The patient states " you know I'm not well."   He states he isn't passing flatus.  NGT output not recorded  Objective: Vital signs in last 24 hours: Temp:  [97.8 F (36.6 C)-99.7 F (37.6 C)] 99.7 F (37.6 C) (11/22 1952) Pulse Rate:  [88-120] 112 (11/23 1040) Resp:  [18-22] 22 (11/23 1040) BP: (131-140)/(70-84) 139/72 mmHg (11/23 1040) SpO2:  [94 %-97 %] 97 % (11/23 1040) Weight:  [147 lb 14.9 oz (67.1 kg)] 147 lb 14.9 oz (67.1 kg) (11/23 0549) Last BM Date: 07/12/14  Intake/Output from previous day: 11/22 0701 - 11/23 0700 In: 0  Out: 1426 [Urine:1425; Stool:1] Intake/Output this shift:    PE: Abd: soft, NT, minimal bloating, NGT with some watered down appearing amber like output.  No pure bile. Lungs: some rhonchi noted.  A lot of upper airway congestion  Lab Results:   Recent Labs  07/20/14 0510  WBC 11.0*  HGB 12.1*  HCT 36.3*  PLT 222   BMET  Recent Labs  07/19/14 0502 07/20/14 0510  NA 136* 134*  K 4.1 3.7  CL 97 97  CO2 26 27  GLUCOSE 139* 142*  BUN 23 22  CREATININE 0.81 0.64  CALCIUM 8.9 9.0   PT/INR  Recent Labs  07/19/14 0502 07/20/14 0510  LABPROT 15.3* 15.7*  INR 1.20 1.24   CMP     Component Value Date/Time   NA 134* 07/20/2014 0510   K 3.7 07/20/2014 0510   CL 97 07/20/2014 0510   CO2 27 07/20/2014 0510   GLUCOSE 142* 07/20/2014 0510   BUN 22 07/20/2014 0510   CREATININE 0.64 07/20/2014 0510   CALCIUM 9.0 07/20/2014 0510   PROT 6.3 07/20/2014 0510   ALBUMIN 2.7* 07/20/2014 0510   AST 27 07/20/2014 0510   ALT 18 07/20/2014 0510   ALKPHOS 157* 07/20/2014 0510   BILITOT 2.3* 07/20/2014 0510   GFRNONAA 84* 07/20/2014 0510   GFRAA >90 07/20/2014 0510   Lipase     Component Value Date/Time   LIPASE 52 07/19/2014 0939       Studies/Results: No results found.  Anti-infectives: Anti-infectives    None        Assessment/Plan  1. PSBO 2. Recent fall with cervical, thoracic spine fx with epidural hematoma 3. AICD, medtronic 4. Systolic CHF, EF 4098%4550% 5. Cardiomyopathy 6. Recent seizure 7. Chronic a fib 8. Deconditioned  Plan: 1. Abdominal films not read yet, but still appear to have some distention of his small bowel.  Will D/W Dr. Derrell Lollingamirez today as I'm concerned he may not improve without surgical intervention, but he is elderly and very frail.  I am not sure how well he is going to go with an operation.  It is possible that we could try something laparoscopic if possible.  Dr. Derrell Lollingamirez will need to review his films and speak to the family prior to making any surgical decisions. 2. Cont NGT    LOS: 11 days    Misaki Sozio E 07/20/2014, 11:51 AM Pager: [phone]

## 2014-07-20 NOTE — Significant Event (Signed)
Rapid Response Event Note  Overview:  Called to be second set of eyes and help with assessment and recommendations for overnight care Time Called: 1735 Arrival Time: 1740 Event Type: Respiratory, Other (Comment)  Initial Focused Assessment:  RN calling to ask for assistance with family requests for higher level of care. Patient lethargic responds to name - answers few questions - audible upper airway congestion - oral suction =- patient with weak cough - refills within 5-10 mins weak gag - bil BS few rhonchi anterior after clearing upper airway - posterior wheezing - in full neck and thoracic brace - moves extremities but very weak - family states he is much weaker today- was getting out of bed weekend - resps shallow - rapid - desats in 80"s with activity - abd round slightly firm - NGT present - bile return.  Condom cath - scant yellow urine.   BP 122/68  HR 110 afib RR 24-32 O2 sats 95% on 2 liter nasal cannula.     Interventions:  Upright position and oral suction - nebulizer treatment.  Extensive conversation with family regarding events of today, their understanding of the goal of palliative care, their concerns with events and the condition of patient - RRT Lyanne Kates Boozericky and RN Isac SarnaQueta present - lots of questions answered about the role of palliative = the wishes for the treatment of his SBO - and his airway protection and secretions.  Wife states she has talked with the sons - who are on their way here from out of state - they wish to proceed with the surgery - although they were hoping to have second opinion about the situation - they were encouraged over the weekend that he was better and are shocked at the sudden need for surgery - they wish to have him "as strong as he can be" for the surgery and want full code status to include intubation and CPR if needed at this point.  Spoke with Dr. Butler Denmarkizwan with update.  PCCM consulted by her - order for transfer to ICU.  Report to American FinancialKatrice RN.  Family updated and  questions again answered.  Transported to 2M03 without incident - handoff to Rohm and HaasJames RN.  Family updated.     Event Summary: Name of Physician Notified: Dr.  Butler Denmarkizwan at  (pta)  Name of Consulting Physician Notified: Dr. Marchelle Gearingamaswamy at  (1800 by Dr. Butler Denmarkizwan)  Outcome: Transferred (Comment) ([id number])  Event End Time: [id number]  Delton PrairieBritt, Namrata Dangler L

## 2014-07-20 NOTE — Progress Notes (Addendum)
[facility] Progress Note   Mark Day ZOX:[id number] DOB: 1922-09-21 DOA: 07/02/2014 PCP: Kirstie Peri, MD  Brief narrative: Mark Day is a 80 y.o. male with atrial fibrillation on Coumadin, chronic systolic heart failure, AICD and hypertension who had a recent hospitalization at [facility] from September to October 2015 with treatment for cervical and thoracic fractures and epidural hematoma following a fall at home. He presented to [facility] on 07/18/2014 with nausea vomiting and possible hematemesis. He was begun on treatment with Augmentin by Dr. Leanord Hawking at [facility] for possible diverticulitis. In the ER abdominal CT scan revealed  small bowel obstruction in distal jejunum/proximal ileum.    Subjective: Having a significant amount of upper airway congestion today. No other complaints.   Assessment/Plan: Principal Problem:   Duodenitis with bleeding -Occurring in setting of anticoagulation and now resolved -S/p EGD 11/14.  Active Problems:    Nausea and vomiting/ SBO  - Continue conservative management for now- [facility] surgery has evaluated him here and are managing  - PICC line placed - TNA started by surgery - Upper GI series with small bowel follow-through reveals a transition point in the mid pelvic area but some contrast is still passing through this point.   Seizure secondary to hypotension- Convulsive syncope -Patient was assisted out of bed on 11/20 by PT - once he stood up it was noted that his body began to shake and his eyes "rolled back into his head". According to the physical therapist, the episode lasted about 15 seconds. Once he stabilized, his vitals were checked and his blood pressure was noted to be low with systolic of 70s.  Hypotension -Suspect volume depletion and now resolved with adjusting IVF - have increase IVF to help match output from NG/  Urine output -he has a history of "congestive heart failure"  however, he is not on diuretics at home -  See ECHO below  Chronic systolic heart failure - ECHO 81/19  reveals EF of 50-55% and mod pulm HTN - Compensated- -watching fluid status carefully- I and O not being recorded as ordered- discussed with charge nurse    Multiple fractures of cervical and thoracic spine - Continue brace- f/u with ortho as outpt -We'll eventually need to return to rehabilitation once stable    Warfarin-induced coagulopathy -Warfarin on hold now as surgery may be necessary- Lovenox bridge    Atrial fibrillation - on Cardizem infusion- asked RN to adjust dose to maintain HR < 100 - Coumadin on hold with Lovenox bridge   AICD (automatic cardioverter/defibrillator) present   Code Status: Full code Family Communication: Wife at bedside Disposition Plan: SNF upon discharge DVT prophylaxis: Full dose Lovenox  Consultants: Surgery GI  Antibiotics: Anti-infectives    None      Objective: Filed Weights   07/18/14 0400 07/19/14 0453 07/20/14 0549  Weight: 66.4 kg (146 lb 6.2 oz) 70.031 kg (154 lb 6.3 oz) 67.1 kg (147 lb 14.9 oz)    Intake/Output Summary (Last 24 hours) at 07/20/14 1044 Last data filed at 07/20/14 0853  Gross per 24 hour  Intake      0 ml  Output   1100 ml  Net  -1100 ml     Vitals Filed Vitals:   07/20/14 0549 07/20/14 0826 07/20/14 0835 07/20/14 1040  BP: 137/70 136/81  139/72  Pulse: 113 109  112  Temp:      TempSrc: Oral     Resp: 22 20 22 22   Height:  Weight: 67.1 kg (147 lb 14.9 oz)     SpO2: 95% 94% 96% 97%    Exam: General: No acute respiratory distress- neck brace in place-alert and oriented 3 Lungs: significant upper airway congestion Cardiovascular: Regular rate and rhythm without murmur gallop or rub normal S1 and S2 Abdomen: Nontender, nondistended, soft, bowel sounds positive, no rebound, no ascites, no appreciable mass-G-tube in place and draining yellow-green fluid Extremities: No significant cyanosis,  clubbing, or edema bilateral lower extremities  Data Reviewed: Basic Metabolic Panel:  Recent Labs Lab 07/15/14 0518 07/17/14 0445 07/18/14 0412 07/19/14 0502 07/20/14 0510  NA 136* 135* 135* 136* 134*  K 4.6 4.3 4.3 4.1 3.7  CL 96 93* 93* 97 97  CO2 30 31 28 26 27   GLUCOSE 125* 131* 119* 139* 142*  BUN 7 16 23 23 22   CREATININE 0.95 1.05 1.01 0.81 0.64  CALCIUM 9.6 9.6 9.2 8.9 9.0  MG  --  1.7  --   --  1.7  PHOS  --  3.3  --   --  2.3   Liver Function Tests:  Recent Labs Lab 07/17/14 0445 07/20/14 0510  AST 21 27  ALT 16 18  ALKPHOS 150* 157*  BILITOT 2.3* 2.3*  PROT 6.8 6.3  ALBUMIN 3.0* 2.7*   No results for input(s): LIPASE, AMYLASE in the last 168 hours. No results for input(s): AMMONIA in the last 168 hours. CBC:  Recent Labs Lab 07/13/14 1757 07/15/14 0518 07/17/14 0445 07/20/14 0510  WBC 7.4 7.5 9.2 11.0*  NEUTROABS  --   --  6.4 8.1*  HGB 12.0* 13.4 13.5 12.1*  HCT 36.0* 39.8 41.6 36.3*  MCV 96.5 96.6 98.8 97.8  PLT 200 222 254 222   Cardiac Enzymes: No results for input(s): CKTOTAL, CKMB, CKMBINDEX, TROPONINI in the last 168 hours. BNP (last 3 results)  Recent Labs  07/27/2014 0939  PROBNP 3979.0*   CBG:  Recent Labs Lab 07/19/14 1642 07/19/14 1953 07/20/14 0005 07/20/14 0448 07/20/14 0810  GLUCAP 131* 142* 133* 142* 137*    Recent Results (from the past 240 hour(s))  H.pylori Antigen, Stool     Status: None   Collection Time: 07/15/2014 12:45 PM  Result Value Ref Range Status   Specimen Description STOOL  Final   Special Requests NONE  Final   H. pylori ag, stool   Final    NEGATIVE Antimicrobials,proton pump inhibitors and bismuth preparations are known to suppress H.pylori and ingestion of these prior to testing may cause a false negative result. If a negative result is obtained for a patient that has  ingested these  compounds within two weeks prior of performing the H.pylori test, results may be falsely negative and should  be repeated with a new specimen two weeks after discontinuing treatment. Performed at [facility]    Report Status 07/14/2014 FINAL  Final     Studies:  Recent x-ray studies have been reviewed in detail by the Attending Physician  Scheduled Meds:  Scheduled Meds: . antiseptic oral rinse  7 mL Mouth Rinse BID  . enoxaparin (LOVENOX) injection  1 mg/kg Subcutaneous Q12H  . insulin aspart  0-9 Units Subcutaneous 6 times per day  . magnesium sulfate 1 - 4 g bolus IVPB  2 g Intravenous Once  . ondansetron (ZOFRAN) IV  4 mg Intravenous 4 times per day  . pantoprazole (PROTONIX) IV  40 mg Intravenous Q12H  . sodium phosphate  Dextrose 5% IVPB  15 mmol Intravenous Once  .  tobramycin  1 drop Both Eyes 4 times per day   Continuous Infusions: . sodium chloride 10 mL/hr at 07/19/14 1324  . diltiazem (CARDIZEM) infusion 7.5 mg/hr (07/20/14 0830)  . Marland Kitchen.TPN (CLINIMIX-E) Adult 83 mL/hr at 07/19/14 1801   And  . fat emulsion 240 mL (07/19/14 1801)  . Marland Kitchen.TPN (CLINIMIX-E) Adult     And  . fat emulsion      Time spent on care of this patient: 35 min   Barett Whidbee, MD 07/20/2014, 10:44 AM  LOS: 11 days   [facility] Office  [phone] Pager - Text Page per [email or web address]  If 7PM-7AM, please contact night-coverage [email or web address]

## 2014-07-20 NOTE — Progress Notes (Signed)
RT called to patient's room to assess. RN stated he was very congested. Patient was on 1 LNC, SAT 94%. BBS coarse, but audible congestion. I encourage patient to cough, and helped him to suction mouth with yaunker. Patient able to cough up large amounts thick, cream secretions. Instructed RN to encourage coughing and call if needed.

## 2014-07-20 NOTE — Progress Notes (Signed)
Patient has been suctioned several times during the shift. He is able to cough, but the cough is very very weak. Respiratory has suctioned him twice, and i have suctioned him 3times.

## 2014-07-20 NOTE — Progress Notes (Signed)
RT called back to assess patient. He seems to be a bit more labored at this time. Encourage to cough, but cough is weaker than before. Able to get up a moderate amount of secretions, but still sounds congested. RN to page MD for further orders.

## 2014-07-20 NOTE — Progress Notes (Signed)
Patient alert and oriented x 3. Vital signs stable. Denies pain and discomfort. Patient is very congested. He is having trouble clearing his throat. Cough is very weak.

## 2014-07-21 ENCOUNTER — Other Ambulatory Visit (HOSPITAL_COMMUNITY): Payer: Medicare Other

## 2014-07-21 ENCOUNTER — Ambulatory Visit (HOSPITAL_COMMUNITY): Payer: Medicare Other | Admitting: Speech Pathology

## 2014-07-21 ENCOUNTER — Inpatient Hospital Stay (HOSPITAL_COMMUNITY): Payer: Medicare Other

## 2014-07-21 DIAGNOSIS — A419 Sepsis, unspecified organism: Secondary | ICD-10-CM

## 2014-07-21 DIAGNOSIS — Z515 Encounter for palliative care: Secondary | ICD-10-CM

## 2014-07-21 DIAGNOSIS — R6520 Severe sepsis without septic shock: Secondary | ICD-10-CM

## 2014-07-21 DIAGNOSIS — Z9911 Dependence on respirator [ventilator] status: Secondary | ICD-10-CM

## 2014-07-21 LAB — BASIC METABOLIC PANEL
ANION GAP: 9 (ref 5–15)
BUN: 23 mg/dL (ref 6–23)
CO2: 28 meq/L (ref 19–32)
CREATININE: 0.7 mg/dL (ref 0.50–1.35)
Calcium: 8.6 mg/dL (ref 8.4–10.5)
Chloride: 97 mEq/L (ref 96–112)
GFR calc non Af Amer: 81 mL/min — ABNORMAL LOW (ref >90)
Glucose, Bld: 127 mg/dL — ABNORMAL HIGH (ref 70–99)
Potassium: 3.3 mEq/L — ABNORMAL LOW (ref 3.7–5.3)
Sodium: 134 mEq/L — ABNORMAL LOW (ref 137–147)

## 2014-07-21 LAB — POCT I-STAT 3, ART BLOOD GAS (G3+)
Acid-Base Excess: 5 mmol/L — ABNORMAL HIGH (ref 0.0–2.0)
BICARBONATE: 28.7 meq/L — AB (ref 20.0–24.0)
O2 SAT: 99 %
PCO2 ART: 38.3 mmHg (ref 35.0–45.0)
Patient temperature: 99.6
TCO2: 30 mmol/L (ref 0–100)
pH, Arterial: 7.485 — ABNORMAL HIGH (ref 7.350–7.450)
pO2, Arterial: 115 mmHg — ABNORMAL HIGH (ref 80.0–100.0)

## 2014-07-21 LAB — GLUCOSE, CAPILLARY
GLUCOSE-CAPILLARY: 116 mg/dL — AB (ref 70–99)
GLUCOSE-CAPILLARY: 131 mg/dL — AB (ref 70–99)
GLUCOSE-CAPILLARY: 167 mg/dL — AB (ref 70–99)
Glucose-Capillary: 184 mg/dL — ABNORMAL HIGH (ref 70–99)
Glucose-Capillary: 137 mg/dL — ABNORMAL HIGH (ref 70–99)
Glucose-Capillary: 144 mg/dL — ABNORMAL HIGH (ref 70–99)

## 2014-07-21 LAB — PHOSPHORUS: Phosphorus: 1.7 mg/dL — ABNORMAL LOW (ref 2.3–4.6)

## 2014-07-21 LAB — CBC
HEMATOCRIT: 32.7 % — AB (ref 39.0–52.0)
Hemoglobin: 11 g/dL — ABNORMAL LOW (ref 13.0–17.0)
MCH: 32.7 pg (ref 26.0–34.0)
MCHC: 33.6 g/dL (ref 30.0–36.0)
MCV: 97.3 fL (ref 78.0–100.0)
Platelets: 191 10*3/uL (ref 150–400)
RBC: 3.36 MIL/uL — ABNORMAL LOW (ref 4.22–5.81)
RDW: 15.5 % (ref 11.5–15.5)
WBC: 9.6 10*3/uL (ref 4.0–10.5)

## 2014-07-21 LAB — MAGNESIUM: Magnesium: 2 mg/dL (ref 1.5–2.5)

## 2014-07-21 LAB — PROTIME-INR
INR: 1.31 (ref 0.00–1.49)
PROTHROMBIN TIME: 16.5 s — AB (ref 11.6–15.2)

## 2014-07-21 LAB — MRSA PCR SCREENING: MRSA by PCR: POSITIVE — AB

## 2014-07-21 LAB — TRIGLYCERIDES: TRIGLYCERIDES: 66 mg/dL (ref <150)

## 2014-07-21 MED ORDER — FAT EMULSION 20 % IV EMUL
250.0000 mL | INTRAVENOUS | Status: AC
  Administered 2014-07-21: 250 mL via INTRAVENOUS
  Filled 2014-07-21: qty 250

## 2014-07-21 MED ORDER — VANCOMYCIN HCL IN DEXTROSE 1-5 GM/200ML-% IV SOLN
1000.0000 mg | Freq: Two times a day (BID) | INTRAVENOUS | Status: DC
  Administered 2014-07-21 – 2014-07-23 (×4): 1000 mg via INTRAVENOUS
  Filled 2014-07-21 (×6): qty 200

## 2014-07-21 MED ORDER — ACETAMINOPHEN 160 MG/5ML PO SOLN
650.0000 mg | Freq: Four times a day (QID) | ORAL | Status: DC | PRN
  Administered 2014-07-21 – 2014-07-23 (×3): 650 mg
  Filled 2014-07-21 (×3): qty 20.3

## 2014-07-21 MED ORDER — POTASSIUM PHOSPHATES 15 MMOLE/5ML IV SOLN
30.0000 mmol | Freq: Once | INTRAVENOUS | Status: AC
  Administered 2014-07-21: 30 mmol via INTRAVENOUS
  Filled 2014-07-21: qty 10

## 2014-07-21 MED ORDER — PROPOFOL 10 MG/ML IV EMUL
0.0000 ug/kg/min | INTRAVENOUS | Status: DC
  Administered 2014-07-20: 30 ug/kg/min via INTRAVENOUS
  Administered 2014-07-21: 20.111 ug/kg/min via INTRAVENOUS
  Administered 2014-07-21: 30 ug/kg/min via INTRAVENOUS
  Administered 2014-07-22: 20 ug/kg/min via INTRAVENOUS
  Administered 2014-07-23: 12.5 ug/kg/min via INTRAVENOUS
  Filled 2014-07-21: qty 100
  Filled 2014-07-21: qty 200
  Filled 2014-07-21 (×2): qty 100

## 2014-07-21 MED ORDER — PIPERACILLIN-TAZOBACTAM 3.375 G IVPB
3.3750 g | Freq: Three times a day (TID) | INTRAVENOUS | Status: DC
  Administered 2014-07-21 – 2014-07-24 (×8): 3.375 g via INTRAVENOUS
  Filled 2014-07-21 (×11): qty 50

## 2014-07-21 MED ORDER — POTASSIUM CHLORIDE 10 MEQ/50ML IV SOLN
10.0000 meq | INTRAVENOUS | Status: AC
  Administered 2014-07-21 (×2): 10 meq via INTRAVENOUS

## 2014-07-21 MED ORDER — PIPERACILLIN-TAZOBACTAM 3.375 G IVPB 30 MIN
3.3750 g | Freq: Once | INTRAVENOUS | Status: AC
  Administered 2014-07-21: 3.375 g via INTRAVENOUS
  Filled 2014-07-21: qty 50

## 2014-07-21 MED ORDER — POTASSIUM CHLORIDE 10 MEQ/50ML IV SOLN: 10.0000 meq | INTRAVENOUS | Status: AC

## 2014-07-21 MED ORDER — TRACE MINERALS CR-CU-F-FE-I-MN-MO-SE-ZN IV SOLN
INTRAVENOUS | Status: AC
  Administered 2014-07-21: 18:00:00 via INTRAVENOUS
  Filled 2014-07-21: qty 2000

## 2014-07-21 MED ORDER — MUPIROCIN 2 % EX OINT
TOPICAL_OINTMENT | Freq: Two times a day (BID) | CUTANEOUS | Status: DC
  Administered 2014-07-21 – 2014-07-23 (×7): via NASAL
  Administered 2014-07-24: 1 via NASAL
  Filled 2014-07-21 (×2): qty 22

## 2014-07-21 NOTE — Progress Notes (Addendum)
PARENTERAL NUTRITION CONSULT NOTE - FOLLOW UP  Pharmacy Consult:  TPN Indication: SBO  No Active Allergies  Patient Measurements: Height: 6' 1"  (185.4 cm) Weight: 159 lb 2.8 oz (72.2 kg) IBW/kg (Calculated) : 79.9  Vital Signs: Temp: 102.4 F (39.1 C) (11/24 0700) Temp Source: Oral (11/24 0350) BP: 98/58 mmHg (11/24 0700) Pulse Rate: 113 (11/24 0700) Intake/Output from previous day: 11/23 0701 - 11/24 0700 In: 2367 [I.V.:270; IV Piggyback:50; TPN:2047] Out: 1350 [Urine:950; Emesis/NG output:400]  Labs:  Recent Labs  07/19/14 0502 07/20/14 0510 07/21/14 0315  WBC  --  11.0* 9.6  HGB  --  12.1* 11.0*  HCT  --  36.3* 32.7*  PLT  --  222 191  INR 1.20 1.24 1.31     Recent Labs  07/19/14 0502 07/20/14 0500 07/20/14 0510 07/20/14 2306 07/21/14 0315  NA 136*  --  134*  --  134*  K 4.1  --  3.7  --  3.3*  CL 97  --  97  --  97  CO2 26  --  27  --  28  GLUCOSE 139*  --  142*  --  127*  BUN 23  --  22  --  23  CREATININE 0.81  --  0.64  --  0.70  CALCIUM 8.9  --  9.0  --  8.6  MG  --   --  1.7  --  2.0  PHOS  --   --  2.3  --  1.7*  PROT  --   --  6.3  --   --   ALBUMIN  --   --  2.7*  --   --   AST  --   --  27  --   --   ALT  --   --  18  --   --   ALKPHOS  --   --  157*  --   --   BILITOT  --   --  2.3*  --   --   PREALBUMIN  --   --  8.1*  --   --   TRIG  --  54  --  66  --    Estimated Creatinine Clearance: 63.9 mL/min (by C-G formula based on Cr of 0.7).    Recent Labs  07/20/14 1611 07/20/14 1933 07/21/14 0351  GLUCAP 145* 154* 116*     Insulin Requirements in the past 24 hours:  8 units sensitive SSI  Assessment: 89 YOM presented to [facility] with GIB and after the bleeding stopped, he was unable to pass flatus or have a BM.  CT and XR showed SBO and patient was transferred to [facility] for further care.  He continues on TPN for nutritional support.  GI: chronic diarrhea admitted with SBO, baseline prealbumin low at 8.4 >> now 8.1.  On IV PPI and  scheduled Zofran.  NG O/P 400 mL.  SBO not improving, need surgical intervention, awaiting family's decision.  Pallcare consulted. Endo: no hx DM - CBGs controlled Lytes: hyponatremia, K+ 3.3, Phos low at 1.7 (KPhos 30 mmol ordered) Renal:SCr normal and stable, BUN WNL - decent UOP 0.5 ml/kg/hr Pulm:intubated, FiO2 40% Cards:HTN / CM / ICD / AFib / sHF (EF 50-55%) - BP soft, tachycardia - Lasix, IV Lopressor, off diltiazem AC: full-dose Lovenox for Afib while Coumadin on hold, mild anemia, plts WNL - INR 1.31 Hepatobil: LFTs WNL except alk phos and tibili - no documentation of jaundice.  TG WNL. Neuro: s/p  fall with broken neck, (+)epidural hematoma. (+)thoracic spine fractures.- s/p surgery 9/15 at [facility] & wearing extensive cervical collar.  Episode of convulsive syncope on 11/20. 11/23 EEG negative for epileptiform.  Started Propofol ID: Tmax 102.4, WBC normalized - not on abx Best Practices: MC, Lovenox, CHG/mupirocin TPN Access:PICC TPN day#:5 (11/19 >> )  Current Nutrition:  Clinimix E 5/15 at 83 mL/hr + 20% IVFE to 46m/hr = 1720 kCal and 100gm protein Propofol 8.7 ml/hr = 230 kCal  Nutritional Goals:  1900-2100 kCal, 90-100 grams of protein per day   Plan:  - Continue Clinimix E 5/15 at 83 mL/hr, decrease 20% IVFE to 75mhr.  TPN + Propofol will provide 1950 kCal and 100 gm protein per day, meeting 100% of patient's needs. - Daily multivitamin and trace elements.  Watch tibili. - Continue sensitive SSI - Watch fever curve - KCL x 2 runs - F/U AM labs   Cella Cappello D. DaMina MarblePharmD, BCPS Pager:  [phone]/24/2015, 9:21 AM

## 2014-07-21 NOTE — Progress Notes (Signed)
eLink Physician-Brief Progress Note Patient Name: Georgette Doverhomas Balderson DOB: 05/29/1923 MRN: [id number]   Date of Service  07/21/2014  HPI/Events of Note  Hypokalcemia and hypophos  eICU Interventions  Potassium and phos replaced     Intervention Category Intermediate Interventions: Electrolyte abnormality - evaluation and management  Laniqua Torrens 07/21/2014, 3:57 AM

## 2014-07-21 NOTE — Consult Note (Signed)
Palliative Medicine Team Consult Note  I had extensive conversation with Mrs. Rann and her son Silvio ClaymanJohn Lipsitz regarding goals of care.  Summary of Decisions:   LIMITED CODE -no ACLS, deactivate defibrillator  Short term intubation only with aggressive treatment of PNA/Acute Respiratory Failure- HCAP protocol-eval progress at 24 hours and at 48 hours  Hold surgical intervention until PNA can be fully characterized and respiratory status/infection status can be stabilized.  Reassess stability and surgical indication- his abdomen is current non-tender, not distended and he has had minimal output in the OG/NG in last 24 hours. Imaging from today shows partial obstruction, contrast in the colon from previously.  I [facility] the CTS orthosis- patient is nearing a full 2 months with support so this can at this time be removed- this will contribute greatly to his comfort and probably also help with less restriction of his rib cage.  Family aware of serious prognosis with the development of a PNA. Right now the PNA and Acute respiratory failure are his most serious problems-his abdomen is clinically benign-although status and underlying etiology of the PSBO are unknown. CCS Dr. Derrell Lollingamirez following and offered surgical intervention. His prognosis is overwhelmingly poor with or without surgery. Family desires to proceed at least for now with treating his acute respiratory failure first since fever and CXR suggest primary source as lungs vs. Worsening process in the abdomen which has been largely unchanged and stable on imaging.   Palliative team has committed to this family to support them and help guide them in complex decision making. His wife is exhausted and grieving- she seems much calmer now that her son has arrived. Will follow closely.  Anderson MaltaElizabeth Latreshia Beauchaine, DO Palliative Medicine

## 2014-07-21 NOTE — Progress Notes (Signed)
PULMONARY / CRITICAL CARE MEDICINE   Name:Mark Day ZOX:[id number] DOB:03/29/1923   ADMISSION DATE: 07/19/2014 CONSULTATION DATE: 07/20/2014  REFERRING MD : Butler Denmarkizwan  CHIEF COMPLAINT: SBO  INITIAL PRESENTATION: 80 y.o. M admitted 11/12 with SBO after recent fall, C-spine/T-spine fx, Treated conservatively with TPN. Developed acute resp failure due to inability to handle secretions and intubated 11/23. CCS plans surgery 11/24. Palliative Care involved   STUDIES:  11/12 CT abd/pelv:  changes c/w SBO, changes highly suggestive of bilateral pulmonary emboli, multiple gallstones, chronic renal cystic change, diverticulosis. 111/2 CT head: acute sinusitis 11/23 KUB: persistent low grade partial SBO 11/23 EEG: mildly abnormal, no epileptiform activity.  SIGNIFICANT EVENTS: 11/12 - admit 11/19 - cards and general surgery consult 11/23- transfer to ICU   SUBJECTIVE:  Sedated. RASS -3. Not F/C  VITAL SIGNS: Temp: [98.6 F (37 C)-99.7 F (37.6 C)] 98.6 F (37 C) (11/23 1400) Pulse Rate: [88-114] 105 (11/23 1400) Resp: [20-26] 22 (11/23 1400) BP: (112-140)/(70-81) 112/77 mmHg (11/23 1400) SpO2: [94 %-97 %] 96 % (11/23 1802) Weight: [67.1 kg (147 lb 14.9 oz)] 67.1 kg (147 lb 14.9 oz) (11/23 0549) HEMODYNAMICS:   VENTILATOR SETTINGS:   INTAKE / OUTPUT: Intake/Output   11/23 0701 - 11/24 0700  IV Piggyback 50  TPN 1024  Total Intake(mL/kg) 1074 (16)  Urine (mL/kg/hr) 200 (0.2)  Emesis/NG output 400 (0.5)  Stool  Total Output 600  Net +474     PHYSICAL EXAMINATION: General: sedated, intubated, NAD. Neuro: RASS -3, MAEs HEENT: Neck brace in place. NCAT Cardiovascular: Reg, no M Lungs: synchronous, clear Abdomen: Soft, NT, diminished BS  Ext: no edema, warm.  LABS: I have reviewed all of today's lab results. Relevant abnormalities are discussed in the A/P section  CXR: B AS dz/infiltrates  ASSESSMENT /  PLAN:  PULMONARY A: Acute hypoxic resp failure Suspected aspiration PNA P:  Cont full vent support - settings reviewed and/or adjusted Cont vent bundle Daily SBT if/when meets criteria  CARDIOVASCULAR RUE PICC 11/19 >>> A:  CAF, rate controlled Ischemic CM - 11/22 echo EF 50-55% HTN P:  Cards following. Anticoagulation per pharm  Diltiazem gtt  RENAL A:  Mild hyponatremia Mild hypokalemia P:  Monitor BMET intermittently Monitor I/Os Correct electrolytes as indicated  GASTROINTESTINAL A:  Prolonged SBO Duodenitis with bleeding - resolved Cholelithiasis P:  SUP: IV pantoprazole. Cont TPN Planned ex lap 11/24  HEMATOLOGIC A:  Warfarin coagulopathy Mild anemia  P:  DVT px: full anticoagulation (enoxaparin) Monitor CBC intermittently Transfuse per usual ICU guidelines  INFECTIOUS A:  Fever, severe sepsis Suspected aspiration P:  Follow up micro results Empiric pip-tazo 11/24 >>   ENDOCRINE A:  Hyperglycemia without prior DM history P:  Cont SSI  NEUROLOGIC A:  ? seizure Epidural hematoma  Multiple C- and T- spine fractures   P:  PAD protocol RASS goal -2   Family updated:  Son updated @ bedside by me. Palliative care involved.  Interdisciplinary Family Meeting v Palliative Care Meeting:    CCM X 45 mins   Billy Fischeravid Simonds, MD ; [facility] Mobile [phone].  After 5:30 PM or weekends, call [phone]

## 2014-07-21 NOTE — Progress Notes (Signed)
10 Days Post-Op  Subjective: Overnight events noted.  Pt with con't labored breathing and intubated.   Palliative care consult pending family meeting.   Objective: Vital signs in last 24 hours: Temp:  [98 F (36.7 C)-102.4 F (39.1 C)] 102.4 F (39.1 C) (11/24 0700) Pulse Rate:  [41-114] 113 (11/24 0700) Resp:  [10-30] 12 (11/24 0700) BP: (85-139)/(47-81) 98/58 mmHg (11/24 0700) SpO2:  [94 %-100 %] 100 % (11/24 0700) FiO2 (%):  [40 %] 40 % (11/24 0326) Weight:  [158 lb 15.2 oz (72.1 kg)-159 lb 2.8 oz (72.2 kg)] 159 lb 2.8 oz (72.2 kg) (11/24 0353) Last BM Date: 07/21/14 (smear)  Intake/Output from previous day: 11/23 0701 - 11/24 0700 In: 2367 [I.V.:270; IV Piggyback:50; TPN:2047] Out: 1350 [Urine:950; Emesis/NG output:400] Intake/Output this shift:    Resp: coarse bilate Bs Cardio: regular rate and rhythm, S1, S2 normal, no murmur, click, rub or gallop GI: soft, nd, nttp, no bowel sounds  Lab Results:   Recent Labs  07/20/14 0510 07/21/14 0315  WBC 11.0* 9.6  HGB 12.1* 11.0*  HCT 36.3* 32.7*  PLT 222 191   BMET  Recent Labs  07/20/14 0510 07/21/14 0315  NA 134* 134*  K 3.7 3.3*  CL 97 97  CO2 27 28  GLUCOSE 142* 127*  BUN 22 23  CREATININE 0.64 0.70  CALCIUM 9.0 8.6   PT/INR  Recent Labs  07/20/14 0510 07/21/14 0315  LABPROT 15.7* 16.5*  INR 1.24 1.31   ABG  Recent Labs  07/20/14 2250 07/21/14 0141  PHART 7.581* 7.485*  HCO3 28.3* 28.7*    Studies/Results: Portable Chest Xray  07/21/2014   CLINICAL DATA:  Respiratory failure.  EXAM: PORTABLE CHEST - 1 VIEW  COMPARISON:  07/20/2014.  FINDINGS: Endotracheal tube, NG to, right PICC line in stable position. Mediastinum is stable. Cardiac pacer in stable position. Persistent cardiomegaly. Progressive bilateral lower lobe infiltrates are present. Small right pleural effusion. These changes could be related bibasilar pneumonia. Congestive heart failure with pulmonary edema cannot be excluded.  No pneumothorax. No acute osseus abnormality.  IMPRESSION: 1. Lines and tubes in stable position. 2. Persistent cardiomegaly. 3. Progressive bilateral lower lobe pulmonary infiltrates and atelectasis. Small right pleural effusion. These changes may be related to bibasilar pneumonia. Congestive heart failure with pulmonary edema cannot be excluded.   Electronically Signed   By: Maisie Fushomas  Register   On: 07/21/2014 07:26   Dg Chest Port 1 View  07/21/2014   CLINICAL DATA:  Intubated  EXAM: PORTABLE CHEST - 1 VIEW  COMPARISON:  07/20/2014  FINDINGS: There is an endotracheal tube with tip between the clavicular heads and carina. An orogastric tube is present in the proximal stomach. Right upper extremity PICC, tip at the SVC. No interval displacement of a single chamber ICD/pacer lead from the left.  Hazy appearance of the lower chest suggesting atelectasis or pleural fluid. There is no evidence of pneumothorax, excluding the extreme left apex which is excluded from view.  Mild cardiomegaly is stable from previous. Unchanged aortic and hilar contours  IMPRESSION: 1. A new endotracheal tube is in good position. 2. Worsening bibasilar aeration, likely from atelectasis and pleural fluid.   Electronically Signed   By: Tiburcio PeaJonathan  Watts M.D.   On: 07/21/2014 05:56   Dg Abd Acute W/chest  07/20/2014   CLINICAL DATA:  Subsequent evaluation for abdominal pain and distention current history of small bowel obstruction  EXAM: ACUTE ABDOMEN SERIES (ABDOMEN 2 VIEW & CHEST 1 VIEW)  COMPARISON:  07/16/2014  FINDINGS: Since the prior study oral contrast has progressed throughout most of the colon now seen at or at mid sigmoid colon. There is significant sigmoid diverticulosis. Similar to the prior study, there remain several loops of mildly distended small bowel up to a diameter of about 3.8 cm. Air is seen into the rectum.  There is no evidence of pneumoperitoneum. There is mild bilateral lower lobe pulmonary parenchymal scarring  or atelectasis. A right PICC line is identified with tip over the cavoatrial junction. An NG tube extends into the stomach. Single lead cardiac pacer is present along with moderate cardiac enlargement.  IMPRESSION: Persistent low-grade partial small bowel obstruction   Electronically Signed   By: Esperanza Heiraymond  Rubner M.D.   On: 07/20/2014 11:52    Anti-infectives: Anti-infectives    None      Assessment/Plan: 1. PSBO 2. Recent fall with cervical, thoracic spine fx with epidural hematoma 3. AICD, medtronic 4. Systolic CHF, EF 16-10%45-50% 5. Cardiomyopathy 6. Recent seizure 7. Chronic a fib 8. Deconditioned 9. VDRF  Plan: 1. Pt now with respir failure requiring intubation. CCM on board 2. con't NGT 3.  I s/w pts wife who wanted to hold off on surgery.  Pt has had this SBO for approx 20d without improvement.  Surgical correction seems to be only option, otherwise pt would require palliation.  Surgical risk high secondary to comorbidities. 4. Palliative Care meeting pending.   LOS: 12 days    [name], [name] 07/21/2014

## 2014-07-21 NOTE — Progress Notes (Addendum)
ANTIBIOTIC CONSULT NOTE - INITIAL  Pharmacy Consult for Zosyn and Vancomycin Indication: rule out sepsis and new fever  No Active Allergies  Patient Measurements: Height: 6\' 1"  (185.4 cm) Weight: 159 lb 2.8 oz (72.2 kg) IBW/kg (Calculated) : 79.9  Vital Signs: Temp: 99.9 F (37.7 C) (11/24 1300) Temp Source: Oral (11/24 0350) BP: 92/48 mmHg (11/24 1300) Pulse Rate: 96 (11/24 1300) Intake/Output from previous day: 11/23 0701 - 11/24 0700 In: 2545 [I.V.:270; IV Piggyback:135; TPN:2140] Out: 1350 [Urine:950; Emesis/NG output:400] Intake/Output from this shift: Total I/O In: 1135.2 [I.V.:67.2; IV Piggyback:510; TPN:558] Out: 420 [Urine:420]  Labs:  Recent Labs  07/19/14 0502 07/20/14 0510 07/21/14 0315  WBC  --  11.0* 9.6  HGB  --  12.1* 11.0*  PLT  --  222 191  CREATININE 0.81 0.64 0.70   Estimated Creatinine Clearance: 63.9 mL/min (by C-G formula based on Cr of 0.7). No results for input(s): VANCOTROUGH, VANCOPEAK, VANCORANDOM, GENTTROUGH, GENTPEAK, GENTRANDOM, TOBRATROUGH, TOBRAPEAK, TOBRARND, AMIKACINPEAK, AMIKACINTROU, AMIKACIN in the last 72 hours.   Microbiology: Recent Results (from the past 720 hour(s))  MRSA PCR Screening     Status: None   Collection Time: 07/20/2014  3:15 PM  Result Value Ref Range Status   MRSA by PCR NEGATIVE NEGATIVE Final    Comment:        The GeneXpert MRSA Assay (FDA approved for NASAL specimens only), is one component of a comprehensive MRSA colonization surveillance program. It is not intended to diagnose MRSA infection nor to guide or monitor treatment for MRSA infections.   H.pylori Antigen, Stool     Status: None   Collection Time: 07/07/2014 12:45 PM  Result Value Ref Range Status   Specimen Description STOOL  Final   Special Requests NONE  Final   H. pylori ag, stool   Final    NEGATIVE Antimicrobials,proton pump inhibitors and bismuth preparations are known to suppress H.pylori and ingestion of these prior to testing  may cause a false negative result. If a negative result is obtained for a patient that has  ingested these  compounds within two weeks prior of performing the H.pylori test, results may be falsely negative and should be repeated with a new specimen two weeks after discontinuing treatment. Performed at [facility]    Report Status 07/14/2014 FINAL  Final  MRSA PCR Screening     Status: Abnormal   Collection Time: 07/20/14 11:52 PM  Result Value Ref Range Status   MRSA by PCR POSITIVE (A) NEGATIVE Final    Comment:        The GeneXpert MRSA Assay (FDA approved for NASAL specimens only), is one component of a comprehensive MRSA colonization surveillance program. It is not intended to diagnose MRSA infection nor to guide or monitor treatment for MRSA infections. RESULT CALLED TO, READ BACK BY AND VERIFIED WITH: ARTIS,J RN [phone] 07/21/14 MITCHELL,L     Medical History: Past Medical History  Diagnosis Date  . Osteoarthritis   . Chronic diarrhea   . Hypertension   . Fracture of vertebra 10/2013    Lumbar  . Epidural hematoma 04/2014     subarachnoid hemorrhage status post fall-[facility]  . Chronic systolic heart failure 04/2014    at [facility]-ejection fraction 45-50%; severe ulnar hypertension  . AICD (automatic cardioverter/defibrillator) present     Medtronic  . Atrial fibrillation     On coumadin  . Multiple fractures of thoracic spine 04/2014    [facility]  . Cervical  spine fracture 04/2014    [facility]  . Cholelithiasis Jun 10, 2014  . Tremor of both hands   . Chronic anticoagulation     With warfarin  . Pulmonary hypertension 04/2014    [facility]  . Atrial dilatation, bilateral 04/2014    [facility]; severe  . Duodenitis with bleeding 07/03/2014   Assessment: 80 year old male with new fever up to 102.4 and severe sepsis with concern for aspiration to start empiric Zosyn per pharmacy dosing. Blood and sputum cultures pending. WBC  is within normal limits. SCr is stable. 11/24 CXR- progressive bilateral lower lobe pulmonary infiltrates and atelectasis.   Goal of Therapy:  Clinical resolution of infection  Plan:  Zosyn 3.375g IV  Over 30 min x1 now then 3.375g IV q8h - 4 hr infusion.  Monitor clinical status, culture results, and renal function.   Link SnufferJessica Millen, PharmD, BCPS Clinical Pharmacist [phone] 07/21/2014,1:51 PM   ADDN: Pharmacy has been consulted to dose vancomycin along with zosyn. Pt remains febrile to 100.2, WBC 9.6, sCr 0.7 with CrCl  ~ 5863mL/min.  Goal of Therapy: Vancomycin trough level 15-20 mcg/mL  Plan: Vancomycin 1000mg  IV q12h Check vancomycin trough at steady state  Arlean Hoppingorey M. Newman PiesBall, PharmD Clinical Pharmacist Pager [phone]

## 2014-07-21 NOTE — Progress Notes (Signed)
NUTRITION FOLLOW UP  Intervention:    Agree with current TPN prescription, continue dosing per Pharmacy.  Nutrition Dx:   Inadequate oral intake related to Upper GI bleed as evidenced by NPO; ongoing  Goal:   Pt to meet >/= 90% of their estimated nutrition needs; unmet  Monitor:   Diet advancement, nutrition support measures, Po intake, labs and wt trends   Assessment:   [facility] He has upper GIB, hematemesis, acute blood loss anemia, possible small bowel obstruction, chronic CHF, multiple fx of thoracic spine, cholelithiasis. Pt has 10% wt loss this past year. He is at risk for malnutrition due to his upper GI bleed/NPO status which is limiting his ability to take in nutrition orally  Patient developed acute respiratory failure due to inability to handle secretions; transferred to the ICU and intubated on 11/23. Palliative Care team is involved in care. Surgery for SBO on hold per family. Patient has been receiving conservative care for SBO since admission.   Patient is receiving TPN with Clinimix E 5/15 @ 83 ml/hr and lipids @ 7 ml/hr. Provides 2160 ml, 1750 kcal, and 100 grams protein per day.   Additional calorie needs being provided by Propofol. Total intake from TPN + Propofol = 1980 kcals (105% of estimated needs) and 100 gm protein (100% of estimated needs).   Patient is currently intubated on ventilator support MV: 7.5 L/min Temp (24hrs), Avg:100.5 F (38.1 C), Min:98 F (36.7 C), Max:102.4 F (39.1 C)  Propofol: 8.7 ml/hr providing 230 kcals/day.  Height: Ht Readings from Last 1 Encounters:  07/15/14 6\' 1"  (1.854 m)    Weight Status:   Wt Readings from Last 1 Encounters:  07/21/14 159 lb 2.8 oz (72.2 kg)   07/17/14 145 lb 6.4 oz (65.953 kg)    07/10/14 161 lb 14.4 oz (73.437 kg)   Re-estimated needs:  Kcal: 1880 Protein: 95-110 grams Fluid: 1.9 L  Skin: ecchymosis  Diet Order: NPO TPN (CLINIMIX-E) Adult Diet NPO time specified TPN (CLINIMIX-E)  Adult   Intake/Output Summary (Last 24 hours) at 07/21/14 1015 Last data filed at 07/21/14 0700  Gross per 24 hour  Intake 2366.98 ml  Output   1350 ml  Net 1016.98 ml    Last BM: 11/24   Labs:   Recent Labs Lab 07/17/14 0445  07/19/14 0502 07/20/14 0510 07/21/14 0315  NA 135*  < > 136* 134* 134*  K 4.3  < > 4.1 3.7 3.3*  CL 93*  < > 97 97 97  CO2 31  < > 26 27 28   BUN 16  < > 23 22 23   CREATININE 1.05  < > 0.81 0.64 0.70  CALCIUM 9.6  < > 8.9 9.0 8.6  MG 1.7  --   --  1.7 2.0  PHOS 3.3  --   --  2.3 1.7*  GLUCOSE 131*  < > 139* 142* 127*  < > = values in this interval not displayed.  CBG (last 3)   Recent Labs  07/20/14 1611 07/20/14 1933 07/21/14 0351  GLUCAP 145* 154* 116*    Scheduled Meds: . antiseptic oral rinse  7 mL Mouth Rinse QID  . chlorhexidine  15 mL Mouth Rinse BID  . enoxaparin (LOVENOX) injection  1 mg/kg Subcutaneous Q12H  . furosemide  20 mg Intravenous Daily  . glycopyrrolate  0.1 mg Intravenous BID  . insulin aspart  0-9 Units Subcutaneous 6 times per day  . metoprolol  2.5 mg Intravenous 4 times per  day  . mupirocin ointment   Nasal BID  . ondansetron (ZOFRAN) IV  4 mg Intravenous 4 times per day  . pantoprazole (PROTONIX) IV  40 mg Intravenous Q12H  . potassium chloride  10 mEq Intravenous Q1 Hr x 2  . potassium phosphate IVPB (mmol)  30 mmol Intravenous Once  . tobramycin  1 drop Both Eyes 4 times per day    Continuous Infusions: . sodium chloride 10 mL/hr at 07/20/14 1509  . diltiazem (CARDIZEM) infusion Stopped (07/21/14 0400)  . Marland Kitchen.TPN (CLINIMIX-E) Adult 83 mL/hr at 07/20/14 1724   And  . fat emulsion 250 mL (07/20/14 1723)  . Marland Kitchen.TPN (CLINIMIX-E) Adult     And  . fat emulsion    . propofol 20 mcg/kg/min (07/21/14 0400)    Joaquin CourtsKimberly Giorgi Debruin, RD, LDN, CNSC Pager [phone] After Hours Pager [phone]

## 2014-07-22 ENCOUNTER — Inpatient Hospital Stay (HOSPITAL_COMMUNITY): Payer: Medicare Other

## 2014-07-22 DIAGNOSIS — Z789 Other specified health status: Secondary | ICD-10-CM

## 2014-07-22 DIAGNOSIS — J690 Pneumonitis due to inhalation of food and vomit: Secondary | ICD-10-CM

## 2014-07-22 DIAGNOSIS — Z978 Presence of other specified devices: Secondary | ICD-10-CM | POA: Insufficient documentation

## 2014-07-22 DIAGNOSIS — J9601 Acute respiratory failure with hypoxia: Secondary | ICD-10-CM | POA: Insufficient documentation

## 2014-07-22 LAB — CBC
HEMATOCRIT: 33.5 % — AB (ref 39.0–52.0)
Hemoglobin: 11.2 g/dL — ABNORMAL LOW (ref 13.0–17.0)
MCH: 31.5 pg (ref 26.0–34.0)
MCHC: 33.4 g/dL (ref 30.0–36.0)
MCV: 94.1 fL (ref 78.0–100.0)
Platelets: 240 10*3/uL (ref 150–400)
RBC: 3.56 MIL/uL — ABNORMAL LOW (ref 4.22–5.81)
RDW: 15.9 % — AB (ref 11.5–15.5)
WBC: 9.7 10*3/uL (ref 4.0–10.5)

## 2014-07-22 LAB — GLUCOSE, CAPILLARY
GLUCOSE-CAPILLARY: 170 mg/dL — AB (ref 70–99)
Glucose-Capillary: 135 mg/dL — ABNORMAL HIGH (ref 70–99)
Glucose-Capillary: 143 mg/dL — ABNORMAL HIGH (ref 70–99)
Glucose-Capillary: 151 mg/dL — ABNORMAL HIGH (ref 70–99)
Glucose-Capillary: 153 mg/dL — ABNORMAL HIGH (ref 70–99)
Glucose-Capillary: 157 mg/dL — ABNORMAL HIGH (ref 70–99)

## 2014-07-22 LAB — BASIC METABOLIC PANEL
Anion gap: 10 (ref 5–15)
BUN: 27 mg/dL — ABNORMAL HIGH (ref 6–23)
CHLORIDE: 92 meq/L — AB (ref 96–112)
CO2: 28 mEq/L (ref 19–32)
CREATININE: 0.82 mg/dL (ref 0.50–1.35)
Calcium: 8.5 mg/dL (ref 8.4–10.5)
GFR calc non Af Amer: 76 mL/min — ABNORMAL LOW (ref >90)
GFR, EST AFRICAN AMERICAN: 88 mL/min — AB (ref >90)
Glucose, Bld: 144 mg/dL — ABNORMAL HIGH (ref 70–99)
Potassium: 3.8 mEq/L (ref 3.7–5.3)
Sodium: 130 mEq/L — ABNORMAL LOW (ref 137–147)

## 2014-07-22 LAB — PHOSPHORUS: Phosphorus: 2.7 mg/dL (ref 2.3–4.6)

## 2014-07-22 LAB — PROTIME-INR
INR: 1.27 (ref 0.00–1.49)
PROTHROMBIN TIME: 16 s — AB (ref 11.6–15.2)

## 2014-07-22 MED ORDER — TRACE MINERALS CR-CU-F-FE-I-MN-MO-SE-ZN IV SOLN
INTRAVENOUS | Status: AC
  Administered 2014-07-22: 17:00:00 via INTRAVENOUS
  Filled 2014-07-22: qty 2000

## 2014-07-22 MED ORDER — FAT EMULSION 20 % IV EMUL
250.0000 mL | INTRAVENOUS | Status: AC
  Administered 2014-07-22: 250 mL via INTRAVENOUS
  Filled 2014-07-22: qty 250

## 2014-07-22 MED ORDER — METOCLOPRAMIDE HCL 5 MG/ML IJ SOLN
5.0000 mg | Freq: Four times a day (QID) | INTRAMUSCULAR | Status: DC
  Administered 2014-07-22 – 2014-07-24 (×9): 5 mg via INTRAVENOUS
  Filled 2014-07-22 (×12): qty 1

## 2014-07-22 MED ORDER — METOPROLOL TARTRATE 1 MG/ML IV SOLN
2.5000 mg | INTRAVENOUS | Status: DC | PRN
  Administered 2014-07-22 – 2014-07-24 (×3): 5 mg via INTRAVENOUS
  Filled 2014-07-22 (×2): qty 5

## 2014-07-22 MED ORDER — PANTOPRAZOLE SODIUM 40 MG IV SOLR
40.0000 mg | INTRAVENOUS | Status: DC
Start: 2014-07-23 — End: 2014-07-24
  Administered 2014-07-23: 40 mg via INTRAVENOUS
  Filled 2014-07-22 (×2): qty 40

## 2014-07-22 MED ORDER — METOPROLOL TARTRATE 1 MG/ML IV SOLN
5.0000 mg | Freq: Four times a day (QID) | INTRAVENOUS | Status: DC
  Administered 2014-07-22 – 2014-07-24 (×8): 5 mg via INTRAVENOUS
  Filled 2014-07-22 (×10): qty 5

## 2014-07-22 NOTE — Progress Notes (Signed)
Palliative care meeting noted Con't NGT Will be available if pt improves and cont' with SBO

## 2014-07-22 NOTE — Progress Notes (Signed)
PT Cancellation Note  Patient Details Name: Mark Day MRN: [id number] DOB: 09/08/1922   Cancelled Treatment:    Reason Eval/Treat Not Completed: Medical issues which prohibited therapy RN reports pt sedated on vent; requests PT be held at this time. Will follow up when medically ready.   [address]Jerri Glauser Secor [place], [place] [phone]   [place], Martha Ellerby S 07/22/2014, 1:55 PM

## 2014-07-22 NOTE — Progress Notes (Signed)
PULMONARY / CRITICAL CARE MEDICINE   Name:Mark Day MVH:[id number] DOB:07/09/1923   ADMISSION DATE: 07/26/2014 CONSULTATION DATE: 07/20/2014  REFERRING MD : Butler Denmarkizwan  CHIEF COMPLAINT: SBO  INITIAL PRESENTATION: 80 y.o. M admitted 11/12 with SBO after recent fall, C-spine/T-spine fx, Treated conservatively with TPN. Developed acute resp failure due to inability to handle secretions and intubated 11/23. CCS plans surgery 11/24. Palliative Care involved   :      SIGNIFICANT STUDIES/EVENTS: [facility]12 CT abd/pelv:  changes c/w SBO, changes highly suggestive of bilateral pulmonary emboli, multiple gallstones, chronic renal cystic change, diverticulosis. 11/12 CT head: acute sinusitis 11/19 - cards and general surgery consult 11/23- transfer to ICU 11/23 KUB: persistent low grade partial SBO 11/23 EEG: mildly abnormal, no epileptiform activity. 11/24 Palliative Care mtg with family: DNR if arrests, AICD turned off, no exp lap, cont vent (short term only) and medical support  SUBJECTIVE:  Sedated. RASS -2. Failed SBT due to tachypnea and tachycardia  VITAL SIGNS: Temp: [98.6 F (37 C)-99.7 F (37.6 C)] 98.6 F (37 C) (11/23 1400) Pulse Rate: [88-114] 105 (11/23 1400) Resp: [20-26] 22 (11/23 1400) BP: (112-140)/(70-81) 112/77 mmHg (11/23 1400) SpO2: [94 %-97 %] 96 % (11/23 1802) Weight: [67.1 kg (147 lb 14.9 oz)] 67.1 kg (147 lb 14.9 oz) (11/23 0549) HEMODYNAMICS:   VENTILATOR SETTINGS:   INTAKE / OUTPUT: Intake/Output   11/23 0701 - 11/24 0700  IV Piggyback 50  TPN 1024  Total Intake(mL/kg) 1074 (16)  Urine (mL/kg/hr) 200 (0.2)  Emesis/NG output 400 (0.5)  Stool  Total Output 600  Net +474     PHYSICAL EXAMINATION: General: sedated, intubated, NAD. Neuro: RASS -2, MAEs HEENT: WNL Cardiovascular: IRIR, no M Lungs: synchronous, clear anteriorly Abdomen: Soft, NT, +BS  Ext: no  edema, warm.  LABS: I have reviewed all of today's lab results. Relevant abnormalities are discussed in the A/P section  CXR: Marked improvement in right basilar atelectasis. No change in left basilar opacity which could be atelectasis or pneumonia  ASSESSMENT / PLAN:  PULMONARY A: Acute hypoxic resp failure Suspected aspiration PNA P:  Cont full support - settings reviewed and/or adjusted Wean in PSV mode as tolerated Cont vent bundle Daily SBT if/when meets criteria  CARDIOVASCULAR RUE PICC 11/19 >>> A:  CAF, rate controlled Ischemic CM - 11/22 echo EF 50-55% HTN P:  Metoprolol increased 11/25 Cont dilt gtt to maintain HR < 115/min Cont LMWH per pharmacy Resume warfarin when able to use gut  RENAL A:  Mild hyponatremia Mild hypokalemia, resolved P:  Monitor BMET intermittently Monitor I/Os Correct electrolytes as indicated  GASTROINTESTINAL A:  Prolonged SBO - clinically improving Duodenitis with bleeding, resolved Cholelithiasis P:  SUP: IV pantoprazole. Cont TPN Cont conservative mgmt  HEMATOLOGIC A:  Chronic warfarin therapy for AF Mild anemia  P:  DVT px: full anticoagulation (enoxaparin) Monitor CBC intermittently Transfuse per usual ICU guidelines  INFECTIOUS A:  Fever, severe sepsis Suspected aspiration P:  All micro results reviewed Cont pip-tazo 11/24 >>   ENDOCRINE A:  Hyperglycemia without prior DM history P:  Cont SSI  NEUROLOGIC A:  Epidural hematoma  Multiple C- and T- spine fractures  ICU associated discomfort P:  Cont PAD protocol RASS goal -2   Family updated:  No family @ bedside 11/25   CCM X 35 mins   Billy Fischeravid Darrel Gloss, MD ; [facility] Mobile [phone].  After 5:30 PM or weekends, call [phone]

## 2014-07-22 NOTE — Clinical Social Work Note (Signed)
Clinical Social Worker continues to follow for disposition. [facility] once medically stable.   Derenda FennelBashira Jariel Drost, MSW, LCSWA [phone] 07/22/2014 4:43 PM

## 2014-07-22 NOTE — Progress Notes (Signed)
Palliative Medicine Team Progress Note  [name] is currently sedated on the vent he remains febrile, CXR slightly improved, he has in general done better with the CTS off but is having increased secretions. Now he has active bowel sounds and much less NG output. I have updated family and provided continued support.   1. Continue current interventions. 2. Consider very slow trickle feeding- need to utilize his gut as soon as possible and evaluate residuals 3. Chaplain consult 4. Family requesting daily updates.  PMT will follow closely. Hopeful that he will wean and be extubated soon -goals remain "conservative" treatment of reversible conditions. I again stressed to family that his prognosis still remains poor and that this course with be unpredictable and likely continue to have complications arise-we will continue to give him every best chance to improve.  Time: 12:50-1:20 Total 30 min Greater than 50%  of this time was spent counseling and coordinating care related to the above assessment and plan.   Mark MaltaElizabeth Aubrina Nieman, DO Palliative Medicine [phone]

## 2014-07-22 NOTE — Progress Notes (Signed)
ANTICOAGULATION CONSULT NOTE  Pharmacy Consult for Lovenox Indication: atrial fibrillation  No Active Allergies  Patient Measurements: Height: 6\' 1"  (185.4 cm) Weight: 158 lb 8.2 oz (71.9 kg) IBW/kg (Calculated) : 79.9  Vital Signs: Temp: 100.6 F (38.1 C) (11/25 1300) Temp Source: Core (Comment) (11/25 0800) BP: 96/58 mmHg (11/25 1300) Pulse Rate: 103 (11/25 1300)  Labs:  Recent Labs  07/20/14 0510 07/21/14 0315 07/22/14 0315 07/22/14 0730  HGB 12.1* 11.0*  --  11.2*  HCT 36.3* 32.7*  --  33.5*  PLT 222 191  --  240  LABPROT 15.7* 16.5* 16.0*  --   INR 1.24 1.31 1.27  --   CREATININE 0.64 0.70 0.82  --    Estimated Creatinine Clearance: 62.1 mL/min (by C-G formula based on Cr of 0.82).  Medical History: Past Medical History  Diagnosis Date  . Osteoarthritis   . Chronic diarrhea   . Hypertension   . Fracture of vertebra 10/2013    Lumbar  . Epidural hematoma 04/2014     [facility]  . Chronic systolic heart failure 04/2014    at [facility]-ejection fraction 45-50%; severe ulnar hypertension  . AICD (automatic cardioverter/defibrillator) present     [employer]tr[employer]  . Atrial fibrillation     On coumadin  . Multiple fractures of thoracic spine 04/2014    [facility]  . Cervical spine fracture 04/2014    [facility]  . Cholelithiasis 07/08/2014  . Tremor of both hands   . Chronic anticoagulation     With warfarin  . Pulmonary hypertension 04/2014    [facility]  . Atrial dilatation, bilateral 04/2014    [facility]; severe  . Duodenitis with bleeding 07/06/2014   Medications:  Prescriptions prior to admission  Medication Sig Dispense Refill Last Dose  . ALPRAZolam (XANAX) 0.25 MG tablet Take 0.125 mg by mouth every 6 (six) hours as needed for anxiety.    07/08/2014 at Unknown time  . amoxicillin-clavulanate (AUGMENTIN) 875-125 MG per tablet Take 1 tablet by mouth 2 (two) times daily. Starting  07/06/2014 and ending 07/10/2014.   07/08/2014 at Unknown time  . aspirin 81 MG tablet Take 81 mg by mouth daily.   07/08/2014 at Unknown time  . atorvastatin (LIPITOR) 10 MG tablet Take 10 mg by mouth daily.   07/08/2014 at Unknown time  . escitalopram (LEXAPRO) 10 MG tablet Take 10 mg by mouth daily.   07/08/2014 at Unknown time  . lisinopril (PRINIVIL,ZESTRIL) 10 MG tablet Take 10 mg by mouth daily.   07/08/2014 at Unknown time  . metoprolol (LOPRESSOR) 50 MG tablet Take 50 mg by mouth 2 (two) times daily.   07/08/2014 at 2000  . warfarin (COUMADIN) 6 MG tablet Take 6 mg by mouth daily.   07/08/2014 at Unknown time  . docusate sodium (COLACE) 100 MG capsule Take 200 mg by mouth daily.   Taking   Assessment: 89 yoM on chronic Coumadin PTA for Afib.  INR was therapeutic at upper end of goal range on admission. He was admitted with UGIB and Coumadin was reversed with total 5mg  Vit K + 1 unit FFP. S/P EGD on 11/14.  Coumadin is being held and pt remains on Lovenox. Pt has SBO. No active bleeding noted. H/H and pltc from 11/25 stable.  Pt has recently gained weight up to 70 kg and will need to adjust enoxaparin dose appropriately  Goal of Therapy:  Lovenox bridge until  Coumadin can be resumed. Monitor platelets by anticoagulation protocol: Yes  Plan:  Continue Lovenox 70mg  sq q12h (1mg /kg/dose) Monitor for s/sx of bleeding, renal function  Gillermo Murdochuhani Alam, PharmD Candidate  07/22/2014 2:04 PM   I have reviewed and agree with above assessment and plan.   Link SnufferJessica Ranay Ketter, PharmD, BCPS Clinical Pharmacist [phone] 07/22/2014, 2:42 PM

## 2014-07-22 NOTE — Progress Notes (Signed)
   Plans for treatment of PNA and otherwise supportive care noted.  [employer]tr[employer] has been contacted to turn off the ICD.  Please call with further questions.

## 2014-07-22 NOTE — Progress Notes (Signed)
PARENTERAL NUTRITION CONSULT NOTE - FOLLOW UP  Pharmacy Consult:  TPN Indication: SBO  No Active Allergies  Patient Measurements: Height: _0  (185.4 cm) Weight: 158 lb 8.2 oz (71.9 kg) IBW/kg (Calculated) : 79.9  Vital Signs: Temp: 101.9 F (38.8 C) (11/25 0600) BP: 122/68 mmHg (11/25 0600) Pulse Rate: 120 (11/25 0600) Intake/Output from previous day: 11/24 0701 - 11/25 0700 In: 3720.1 [I.V.:420.1; NG/GT:90; IV Piggyback:1110; TPN:2100] Out: 2035 [Urine:1835; Emesis/NG output:200]  Labs:  Recent Labs  07/20/14 0510 07/21/14 0315 07/22/14 0315 07/22/14 0730  WBC 11.0* 9.6  --  9.7  HGB 12.1* 11.0*  --  11.2*  HCT 36.3* 32.7*  --  33.5*  PLT 222 191  --  240  INR 1.24 1.31 1.27  --      Recent Labs  07/20/14 0500 07/20/14 0510 07/20/14 2306 07/21/14 0315 07/22/14 0315  NA  --  134*  --  134* 130*  K  --  3.7  --  3.3* 3.8  CL  --  97  --  97 92*  CO2  --  27  --  28 28  GLUCOSE  --  142*  --  127* 144*  BUN  --  22  --  23 27*  CREATININE  --  0.64  --  0.70 0.82  CALCIUM  --  9.0  --  8.6 8.5  MG  --  1.7  --  2.0  --   PHOS  --  2.3  --  1.7* 2.7  PROT  --  6.3  --   --   --   ALBUMIN  --  2.7*  --   --   --   AST  --  27  --   --   --   ALT  --  18  --   --   --   ALKPHOS  --  157*  --   --   --   BILITOT  --  2.3*  --   --   --   PREALBUMIN  --  8.1*  --   --   --   TRIG 54  --  66  --   --    Estimated Creatinine Clearance: 62.1 mL/min (by C-G formula based on Cr of 0.82).    Recent Labs  07/21/14 1926 07/21/14 2332 07/22/14 0355  GLUCAP 144* 135* 170*     Insulin Requirements in the past 24 hours:  7 units sensitive SSI  Assessment: 85 YOM presented to [facility] with GIB and after the bleeding stopped, he was unable to pass flatus or have a BM.  CT and XR showed SBO and patient was [facility] to [facility] care.  He continues on TPN for nutritional support.  GI: chronic diarrhea admitted with SBO, baseline prealbumin low at 8.4  >> now 8.1.  On IV PPI and scheduled Zofran.  NG O/P decreased to 200 mL.  SBO not improving, need surgical intervention, awaiting family's decision.  Pallcare consulted. Endo: no hx DM - CBGs adequately controlled Lytes: low Na/CL, others WNL Renal:SCr normal and stable, BUN WNL - good UOP 1.1 ml/kg/hr Pulm:intubated, FiO2 40% - Robinul d/t secretions Cards:HTN / CM / ICD / AFib / sHF (EF 50-55%) - BP soft, tachycardia - Lasix, IV Lopressor, back on diltiazem AC: full-dose Lovenox for Afib while Coumadin on hold, mild anemia, plts WNL - INR 1.27 Hepatobil: LFTs WNL except alk phos and tibili - no documentation of jaundice.  TG WNL. Neuro: s/p fall with broken neck, (+)epidural hematoma. (+)thoracic spine fractures.- s/p surgery 9/15 at [facility] & wearing extensive cervical collar.  Episode of convulsive syncope on 11/20. 11/23 EEG negative for epileptiform.  Started Propofol - GCS 11, RASS -2 ID: Vanc/Zosyn D#2, Tmax 102, WBC normalized Best Practices: MC, Lovenox, CHG/mupirocin TPN Access:PICC TPN day#:6 (11/19 >> )  Current Nutrition:  Clinimix E 5/15 at 83 mL/hr + 20% IVFE to 43m/hr = 1720 kCal and 100gm protein Propofol 6.5 ml/hr = 172 kCal  Nutritional Goals:  1800-1900 kCal, 95-110 grams of protein per day   Plan:  - Continue Clinimix E 5/15 at 83 mL/hr, decrease 20% IVFE to 769mhr.  TPN + Propofol will provide 1892 kCal and 100 gm protein per day, meeting 100% of patient's needs. - Daily multivitamin and trace elements.  Watch tibili. - Continue sensitive SSI - F/U AM labs    Johnnetta Holstine D. DaMina MarblePharmD, BCPS Pager:  [phone] 55421/25/2015, 9:03 AM

## 2014-07-23 ENCOUNTER — Inpatient Hospital Stay (HOSPITAL_COMMUNITY): Payer: Medicare Other

## 2014-07-23 LAB — COMPREHENSIVE METABOLIC PANEL
ALBUMIN: 2 g/dL — AB (ref 3.5–5.2)
ALK PHOS: 190 U/L — AB (ref 39–117)
ALT: 21 U/L (ref 0–53)
AST: 30 U/L (ref 0–37)
Anion gap: 11 (ref 5–15)
BUN: 31 mg/dL — ABNORMAL HIGH (ref 6–23)
CO2: 27 meq/L (ref 19–32)
Calcium: 8.7 mg/dL (ref 8.4–10.5)
Chloride: 95 mEq/L — ABNORMAL LOW (ref 96–112)
Creatinine, Ser: 0.93 mg/dL (ref 0.50–1.35)
GFR calc Af Amer: 84 mL/min — ABNORMAL LOW (ref >90)
GFR calc non Af Amer: 72 mL/min — ABNORMAL LOW (ref >90)
GLUCOSE: 126 mg/dL — AB (ref 70–99)
POTASSIUM: 3.7 meq/L (ref 3.7–5.3)
Sodium: 133 mEq/L — ABNORMAL LOW (ref 137–147)
Total Bilirubin: 2.1 mg/dL — ABNORMAL HIGH (ref 0.3–1.2)
Total Protein: 5.5 g/dL — ABNORMAL LOW (ref 6.0–8.3)

## 2014-07-23 LAB — MAGNESIUM: Magnesium: 1.9 mg/dL (ref 1.5–2.5)

## 2014-07-23 LAB — GLUCOSE, CAPILLARY
GLUCOSE-CAPILLARY: 142 mg/dL — AB (ref 70–99)
GLUCOSE-CAPILLARY: 149 mg/dL — AB (ref 70–99)
GLUCOSE-CAPILLARY: 157 mg/dL — AB (ref 70–99)
Glucose-Capillary: 141 mg/dL — ABNORMAL HIGH (ref 70–99)
Glucose-Capillary: 129 mg/dL — ABNORMAL HIGH (ref 70–99)

## 2014-07-23 LAB — PHOSPHORUS: Phosphorus: 2.6 mg/dL (ref 2.3–4.6)

## 2014-07-23 LAB — VANCOMYCIN, TROUGH: VANCOMYCIN TR: 21 ug/mL — AB (ref 10.0–20.0)

## 2014-07-23 LAB — TRIGLYCERIDES: Triglycerides: 76 mg/dL (ref <150)

## 2014-07-23 LAB — PROTIME-INR
INR: 1.33 (ref 0.00–1.49)
Prothrombin Time: 16.6 seconds — ABNORMAL HIGH (ref 11.6–15.2)

## 2014-07-23 MED ORDER — FAT EMULSION 20 % IV EMUL
250.0000 mL | INTRAVENOUS | Status: DC
  Administered 2014-07-23: 250 mL via INTRAVENOUS
  Filled 2014-07-23: qty 250

## 2014-07-23 MED ORDER — TRACE MINERALS CR-CU-F-FE-I-MN-MO-SE-ZN IV SOLN
INTRAVENOUS | Status: DC
  Administered 2014-07-23: 17:00:00 via INTRAVENOUS
  Filled 2014-07-23: qty 2000

## 2014-07-23 MED ORDER — POTASSIUM CHLORIDE 10 MEQ/50ML IV SOLN
10.0000 meq | INTRAVENOUS | Status: AC
  Administered 2014-07-23 (×2): 10 meq via INTRAVENOUS
  Filled 2014-07-23 (×2): qty 50

## 2014-07-23 MED ORDER — VANCOMYCIN HCL IN DEXTROSE 750-5 MG/150ML-% IV SOLN: 750.0000 mg | Freq: Two times a day (BID) | INTRAVENOUS | Status: DC

## 2014-07-23 MED ORDER — VANCOMYCIN HCL IN DEXTROSE 750-5 MG/150ML-% IV SOLN
750.0000 mg | Freq: Two times a day (BID) | INTRAVENOUS | Status: DC
  Administered 2014-07-23 – 2014-07-24 (×2): 750 mg via INTRAVENOUS
  Filled 2014-07-23 (×4): qty 150

## 2014-07-23 NOTE — Progress Notes (Signed)
PARENTERAL NUTRITION CONSULT NOTE - FOLLOW UP  Pharmacy Consult:  TPN Indication: SBO  No Active Allergies  Patient Measurements: Height: 6' 1"  (185.4 cm) Weight: 156 lb 12 oz (71.1 kg) IBW/kg (Calculated) : 79.9  Vital Signs: Temp: 101 F (38.3 C) (11/26 0800) Temp Source: Core (Comment) (11/26 0800) BP: 99/56 mmHg (11/26 0812) Pulse Rate: 106 (11/26 0812) Intake/Output from previous day: 11/25 0701 - 11/26 0700 In: 3096 [I.V.:386; NG/GT:90; IV Piggyback:550; TPN:2070] Out: 2115 [Urine:2115]  Labs:  Recent Labs  07/21/14 0315 07/22/14 0315 07/22/14 0730 07/23/14 0420  WBC 9.6  --  9.7  --   HGB 11.0*  --  11.2*  --   HCT 32.7*  --  33.5*  --   PLT 191  --  240  --   INR 1.31 1.27  --  1.33     Recent Labs  07/20/14 2306 07/21/14 0315 07/22/14 0315 07/23/14 0420  NA  --  134* 130* 133*  K  --  3.3* 3.8 3.7  CL  --  97 92* 95*  CO2  --  28 28 27   GLUCOSE  --  127* 144* 126*  BUN  --  23 27* 31*  CREATININE  --  0.70 0.82 0.93  CALCIUM  --  8.6 8.5 8.7  MG  --  2.0  --  1.9  PHOS  --  1.7* 2.7 2.6  PROT  --   --   --  5.5*  ALBUMIN  --   --   --  2.0*  AST  --   --   --  30  ALT  --   --   --  21  ALKPHOS  --   --   --  190*  BILITOT  --   --   --  2.1*  TRIG 66  --   --   --    Estimated Creatinine Clearance: 54.2 mL/min (by C-G formula based on Cr of 0.93).    Recent Labs  07/22/14 2350 07/23/14 0401 07/23/14 0743  GLUCAP 149* 157* 142*     Insulin Requirements in the past 24 hours:  12 units sensitive SSI  Assessment: 89 YOM presented to [facility] with GIB and after the bleeding stopped, he was unable to pass flatus or have a BM.  CT and XR showed SBO and patient was transferred to [facility] for further care.  He continues on TPN for nutritional support.  GI: chronic diarrhea admitted with SBO, baseline prealbumin low at 8.4 >> now 8.1.  On IV PPI and scheduled Zofran.   SBO not improving, need surgical intervention, awaiting family's decision.   Pallcare consulted. Endo: no hx DM - CBGs adequately controlled Lytes: low Na/CL, others WNL Renal:SCr normal and stable, BUN WNL - good UOP 1.2 ml/kg/hr Pulm:intubated, FiO2 40% - Robinul d/t secretions Cards:HTN / CM / ICD / AFib / sHF (EF 50-55%) - BP soft, tachycardia - Lasix, IV Lopressor, back on diltiazem AC: full-dose Lovenox for Afib while Coumadin on hold, mild anemia, plts WNL - INR 1.27 Hepatobil: LFTs WNL except alk phos and tibili - no documentation of jaundice.  TG WNL. Neuro: s/p fall with broken neck, (+)epidural hematoma. (+)thoracic spine fractures.- s/p surgery 9/15 at [facility] & wearing extensive cervical collar.  Episode of convulsive syncope on 11/20. 11/23 EEG negative for epileptiform.  Started Propofol - GCS 11, RASS -2 ID: Vanc/Zosyn D#3, Tmax 102, WBC normalized Best Practices: MC, Lovenox, CHG/mupirocin TPN Access:PICC TPN day#:7 (11/19 >> )  Current Nutrition:  Clinimix E 5/15 at 83 mL/hr + 20% IVFE to 72m/hr = 1720 kCal and 100gm protein Propofol off  Nutritional Goals:  1800-1900 kCal, 95-110 grams of protein per day   Plan:  - Continue Clinimix E 5/15 at 83 mL/hr, increase 20% IVFE to 127mhr.  TPN providing 1894 kcal and 100g protein- Daily multivitamin and trace elements.  Watch tibili. - Continue sensitive SSI - F/U AM labs   JeHeide GuilePharmD, BCPS Clinical Pharmacist Pager [phone] 07/23/2014, 9:52 AM

## 2014-07-23 NOTE — Progress Notes (Signed)
I spoke with Mr. Mark Day's son who tells me that they are happy to here that there has been some improvement in his bowel obstruction and for him to be more awake and weaning for ~5 hours on the ventilator. He is hoping for extubation soon. He does tell me that he knows if his father "pulls through this we are back at square one." He knows that this is not going to be an easy path for his father.   Yong ChannelAlicia Shanitha Twining, NP Palliative Medicine Team Pager # [phone] (M-F 8a-5p) Team Phone # [phone] (Nights/Weekends)

## 2014-07-23 NOTE — Progress Notes (Signed)
PULMONARY / CRITICAL CARE MEDICINE   Name:Mark Day GEX:[id number] DOB:12/28/1922   ADMISSION DATE: 07/16/2014 CONSULTATION DATE: 07/20/2014  REFERRING MD : Butler Denmarkizwan  CHIEF COMPLAINT: SBO  INITIAL PRESENTATION: 80 y.o. M admitted 11/12 with SBO after recent fall, C-spine/T-spine fx, Treated conservatively with TPN. Developed acute resp failure due to inability to handle secretions and intubated 11/23. CCS plans surgery 11/24. Palliative Care involved  SIGNIFICANT STUDIES/EVENTS: [facility] abd/pelv:  changes c/w SBO, changes highly suggestive of bilateral pulmonary emboli, multiple gallstones, chronic renal cystic change, diverticulosis. 11/12 CT head: acute sinusitis 11/19 - cards and general surgery consult 11/23- transfer to ICU 11/23 KUB: persistent low grade partial SBO 11/23 EEG: mildly abnormal, no epileptiform activity. 11/24 Palliative Care mtg with family: DNR if arrests, AICD turned off, no exp lap, cont vent (short term only) and medical support 11/26 Tolerating PSV wean. Abdominal exam remains unimpressive but decreased BS persist. KUB - improving partial SBO  SUBJECTIVE:  Off propofol. RASS -1. Tolerates PSV 7 cm H2O  VITAL SIGNS: Temp: [98.6 F (37 C)-99.7 F (37.6 C)] 98.6 F (37 C) (11/23 1400) Pulse Rate: [88-114] 105 (11/23 1400) Resp: [20-26] 22 (11/23 1400) BP: (112-140)/(70-81) 112/77 mmHg (11/23 1400) SpO2: [94 %-97 %] 96 % (11/23 1802) Weight: [67.1 kg (147 lb 14.9 oz)] 67.1 kg (147 lb 14.9 oz) (11/23 0549) HEMODYNAMICS:   VENTILATOR SETTINGS:   INTAKE / OUTPUT: Intake/Output   11/23 0701 - 11/24 0700  IV Piggyback 50  TPN 1024  Total Intake(mL/kg) 1074 (16)  Urine (mL/kg/hr) 200 (0.2)  Emesis/NG output 400 (0.5)  Stool  Total Output 600  Net +474     PHYSICAL EXAMINATION: General: sedated, intubated, NAD. Neuro: RASS -1, MAEs, diffusely weak HEENT:  chronic conjunctivits Cardiovascular: IRIR, tachy, no M Lungs: synchronous, clear anteriorly Abdomen: Soft, NT, minimal BS  Ext: no edema, warm.  LABS: I have reviewed all of today's lab results. Relevant abnormalities are discussed in the A/P section  CXR: NSC KUB: resolving partial SBO   ASSESSMENT / PLAN:  PULMONARY A: Acute hypoxic resp failure - improving Suspected aspiration PNA P:  Cont vent support - settings reviewed and/or adjusted Wean in PSV mode as tolerated Cont vent bundle Daily SBT if/when meets criteria  CARDIOVASCULAR RUE PICC 11/19 >>> A:  CAF, rate controlled Ischemic CM - 11/22 echo EF 50-55% HTN P:  Metoprolol increased 11/25 Cont PRN metoprolol to maintain HR < 120/min Cont LMWH per pharmacy Resume warfarin when able to use gut  RENAL A:  Mild hyponatremia Mild hypokalemia, resolved P:  Monitor BMET intermittently Monitor I/Os Correct electrolytes as indicated  GASTROINTESTINAL A:  Prolonged SBO - clinically improving Duodenitis with bleeding, resolved Cholelithiasis P:  SUP: IV pantoprazole. Cont TPN Cont conservative mgmt Clamp OGT 11/26  HEMATOLOGIC A:  Chronic warfarin therapy for AF Mild anemia  P:  DVT px: full anticoagulation (enoxaparin) Monitor CBC intermittently Transfuse per usual ICU guidelines  INFECTIOUS A:  Fever, severe sepsis Suspected aspiration P:  All micro results reviewed Cont pip-tazo 11/24 >>   ENDOCRINE A:  Hyperglycemia without prior DM history, controlled P:  Cont SSI  NEUROLOGIC A:  Epidural hematoma  Multiple C- and T- spine fractures  ICU associated discomfort P:  Cont PAD protocol RASS goal -1 DC propofol 11/26 PRN midaz, PRN fentanyl  Family updated:  No family @ bedside 11/25   CCM X 35 mins   Billy Fischeravid Simonds, MD ; [facility] Mobile [phone].  After 5:30  PM or weekends, call [phone]

## 2014-07-23 NOTE — Progress Notes (Signed)
ANTIBIOTIC CONSULT NOTE - FOLLOW UP  Pharmacy Consult for Vancomycin and Zosyn Indication: Sepsis / suspected aspiration  No Active Allergies  Patient Measurements: Height: 6\' 1"  (185.4 cm) Weight: 156 lb 12 oz (71.1 kg) IBW/kg (Calculated) : 79.9  Vital Signs: Temp: 99.4 F (37.4 C) (11/26 1400) Temp Source: Core (Comment) (11/26 0800) BP: 99/55 mmHg (11/26 1400) Pulse Rate: 105 (11/26 1400) Intake/Output from previous day: 11/25 0701 - 11/26 0700 In: 3196 [I.V.:396; NG/GT:90; IV Piggyback:550; TPN:2160] Out: 2115 [Urine:2115] Intake/Output from this shift: Total I/O In: 753.4 [I.V.:73.4; IV Piggyback:50; TPN:630] Out: 625 [Urine:625]  Labs:  Recent Labs  07/21/14 0315 07/22/14 0315 07/22/14 0730 07/23/14 0420  WBC 9.6  --  9.7  --   HGB 11.0*  --  11.2*  --   PLT 191  --  240  --   CREATININE 0.70 0.82  --  0.93   Estimated Creatinine Clearance: 54.2 mL/min (by C-G formula based on Cr of 0.93).  Recent Labs  07/23/14 1510  VANCOTROUGH 21.0*     Microbiology: Recent Results (from the past 720 hour(s))  MRSA PCR Screening     Status: None   Collection Time: 12-Mar-2014  3:15 PM  Result Value Ref Range Status   MRSA by PCR NEGATIVE NEGATIVE Final    Comment:        The GeneXpert MRSA Assay (FDA approved for NASAL specimens only), is one component of a comprehensive MRSA colonization surveillance program. It is not intended to diagnose MRSA infection nor to guide or monitor treatment for MRSA infections.   H.pylori Antigen, Stool     Status: None   Collection Time: 07/15/2014 12:45 PM  Result Value Ref Range Status   Specimen Description STOOL  Final   Special Requests NONE  Final   H. pylori ag, stool   Final    NEGATIVE Antimicrobials,proton pump inhibitors and bismuth preparations are known to suppress H.pylori and ingestion of these prior to testing may cause a false negative result. If a negative result is obtained for a patient that has  ingested  these  compounds within two weeks prior of performing the H.pylori test, results may be falsely negative and should be repeated with a new specimen two weeks after discontinuing treatment. Performed at [facility]    Report Status 07/14/2014 FINAL  Final  Culture, respiratory (NON-Expectorated)     Status: None (Preliminary result)   Collection Time: 07/20/14 10:29 PM  Result Value Ref Range Status   Specimen Description ENDOTRACHEAL ASPIRATE  Final   Special Requests NONE  Final   Gram Stain   Final    MODERATE WBC PRESENT, PREDOMINANTLY PMN RARE SQUAMOUS EPITHELIAL CELLS PRESENT RARE GRAM POSITIVE COCCI IN PAIRS Performed at [facility]    Culture   Final    ABUNDANT STAPHYLOCOCCUS AUREUS Note: RIFAMPIN AND GENTAMICIN SHOULD NOT BE USED AS SINGLE DRUGS FOR TREATMENT OF STAPH INFECTIONS. Performed at [facility]    Report Status PENDING  Incomplete  MRSA PCR Screening     Status: Abnormal   Collection Time: 07/20/14 11:52 PM  Result Value Ref Range Status   MRSA by PCR POSITIVE (A) NEGATIVE Final    Comment:        The GeneXpert MRSA Assay (FDA approved for NASAL specimens only), is one component of a comprehensive MRSA colonization surveillance program. It is not intended to diagnose MRSA infection nor to guide or monitor treatment for MRSA infections. RESULT CALLED TO, READ BACK  BY AND VERIFIED WITH: ARTIS,J RN [id number] 07/21/14 MITCHELL,L   Culture, blood (routine x 2)     Status: None (Preliminary result)   Collection Time: 07/21/14 11:10 AM  Result Value Ref Range Status   Specimen Description BLOOD LEFT HAND  Final   Special Requests BOTTLES DRAWN AEROBIC ONLY 10CC  Final   Culture  Setup Time   Final    07/21/2014 17:08 Performed at [facility]    Culture   Final           BLOOD CULTURE RECEIVED NO GROWTH TO DATE CULTURE WILL BE HELD FOR 5 DAYS BEFORE ISSUING A FINAL NEGATIVE REPORT Performed at [facility]     Report Status PENDING  Incomplete  Culture, blood (routine x 2)     Status: None (Preliminary result)   Collection Time: 07/21/14 11:20 AM  Result Value Ref Range Status   Specimen Description BLOOD LEFT ARM  Final   Special Requests BOTTLES DRAWN AEROBIC ONLY 10CC  Final   Culture  Setup Time   Final    07/21/2014 17:07 Performed at [facility]    Culture   Final           BLOOD CULTURE RECEIVED NO GROWTH TO DATE CULTURE WILL BE HELD FOR 5 DAYS BEFORE ISSUING A FINAL NEGATIVE REPORT Performed at [facility]    Report Status PENDING  Incomplete    Anti-infectives    Start     Dose/Rate Route Frequency Ordered Stop   07/21/14 1600  vancomycin (VANCOCIN) IVPB 1000 mg/200 mL premix     1,000 mg200 mL/hr over 60 Minutes Intravenous Every 12 hours 07/21/14 1557     07/21/14 1500  piperacillin-tazobactam (ZOSYN) IVPB 3.375 g     3.375 g100 mL/hr over 30 Minutes Intravenous  Once 07/21/14 1446 07/21/14 1518   07/21/14 1400  piperacillin-tazobactam (ZOSYN) IVPB 3.375 g     3.375 g12.5 mL/hr over 240 Minutes Intravenous 3 times per day 07/21/14 1358       Assessment: 80 year old male with new fever up to 102.4 and severe sepsis with concern for aspiration to start empiric Zosyn and vancomycin per pharmacy. WBC is within normal limits. SCr is stable. CrCl~10450ml/min. 11/24 CXR- progressive bilateral lower lobe pulmonary infiltrates and atelectasis. Resp cx shows abundant staph aureus and MRSA PCR positive. Blood cx shows NGTD. VT on 11/26 was drawn appropriately and supratherapeutic at 21.0.  Goal of Therapy:  Eradication of infection  Plan:  Continue Zosyn 3.375g IV q8h - 4 hr infusion. Decrease Vancomycin to 750mg  IV q12h Check vancomycin trough at steady state  Monitor clinical status, culture results, and renal function  Anarosa Kubisiak J 07/23/2014,4:10 PM

## 2014-07-23 NOTE — Progress Notes (Signed)
[facility] FOR AM LAB REPLACEMENT ONLY  The patient does apply [facility] the [facility] Electroly[facility] Protocol based on the criteria listed below:   1. Is GFR >/= 40 ml/min? Yes.    Patient's GFR today is 72 2. Is urine output >/= 0.5 ml/kg/hr for the last 6 hours? Yes.   Patient's UOP is 1.1 ml/kg/hr 3. Is BUN < 60 mg/dL? Yes.    Patient's BUN today is 31 4. Abnormal electrolyte(s): K3.7 5. Ordered repletion with: 7020meq/IV 6. If a panic level lab has been reported, has the CCM MD in charge been notified? Yes.  .   Physician:  B Gabriel CirriMcQuaid,MD  Mark Day William 07/23/2014 6:16 AM

## 2014-07-24 ENCOUNTER — Inpatient Hospital Stay (HOSPITAL_COMMUNITY): Payer: Medicare Other

## 2014-07-24 DIAGNOSIS — Z66 Do not resuscitate: Secondary | ICD-10-CM | POA: Diagnosis not present

## 2014-07-24 DIAGNOSIS — K117 Disturbances of salivary secretion: Secondary | ICD-10-CM | POA: Insufficient documentation

## 2014-07-24 DIAGNOSIS — Z515 Encounter for palliative care: Secondary | ICD-10-CM

## 2014-07-24 LAB — CULTURE, RESPIRATORY

## 2014-07-24 LAB — BASIC METABOLIC PANEL
Anion gap: 12 (ref 5–15)
BUN: 32 mg/dL — ABNORMAL HIGH (ref 6–23)
CALCIUM: 8.8 mg/dL (ref 8.4–10.5)
CHLORIDE: 97 meq/L (ref 96–112)
CO2: 26 meq/L (ref 19–32)
CREATININE: 0.91 mg/dL (ref 0.50–1.35)
GFR calc Af Amer: 85 mL/min — ABNORMAL LOW (ref >90)
GFR calc non Af Amer: 73 mL/min — ABNORMAL LOW (ref >90)
Glucose, Bld: 128 mg/dL — ABNORMAL HIGH (ref 70–99)
Potassium: 4.1 mEq/L (ref 3.7–5.3)
Sodium: 135 mEq/L — ABNORMAL LOW (ref 137–147)

## 2014-07-24 LAB — CULTURE, RESPIRATORY W GRAM STAIN

## 2014-07-24 LAB — GLUCOSE, CAPILLARY
GLUCOSE-CAPILLARY: 127 mg/dL — AB (ref 70–99)
GLUCOSE-CAPILLARY: 139 mg/dL — AB (ref 70–99)
GLUCOSE-CAPILLARY: 123 mg/dL — AB (ref 70–99)
Glucose-Capillary: 130 mg/dL — ABNORMAL HIGH (ref 70–99)
Glucose-Capillary: 129 mg/dL — ABNORMAL HIGH (ref 70–99)
Glucose-Capillary: 149 mg/dL — ABNORMAL HIGH (ref 70–99)

## 2014-07-24 LAB — PROTIME-INR
INR: 1.24 (ref 0.00–1.49)
Prothrombin Time: 15.7 seconds — ABNORMAL HIGH (ref 11.6–15.2)

## 2014-07-24 MED ORDER — TRACE MINERALS CR-CU-F-FE-I-MN-MO-SE-ZN IV SOLN
INTRAVENOUS | Status: DC
  Filled 2014-07-24: qty 2000

## 2014-07-24 MED ORDER — MORPHINE SULFATE 10 MG/ML IJ SOLN
1.0000 mg/h | INTRAMUSCULAR | Status: DC
  Administered 2014-07-24: 3 mg/h via INTRAVENOUS
  Filled 2014-07-24: qty 10

## 2014-07-24 MED ORDER — MORPHINE SULFATE 2 MG/ML IJ SOLN
1.0000 mg | INTRAMUSCULAR | Status: DC | PRN
  Administered 2014-07-24 (×2): 1 mg via INTRAVENOUS
  Filled 2014-07-24 (×2): qty 1

## 2014-07-24 MED ORDER — WHITE PETROLATUM GEL
Status: AC
  Filled 2014-07-24: qty 5

## 2014-07-24 MED ORDER — FAT EMULSION 20 % IV EMUL
250.0000 mL | INTRAVENOUS | Status: DC
  Filled 2014-07-24: qty 250

## 2014-07-24 MED ORDER — FENTANYL CITRATE 0.05 MG/ML IJ SOLN: 12.5000 ug | INTRAMUSCULAR | Status: DC | PRN

## 2014-07-24 MED ORDER — MORPHINE BOLUS VIA INFUSION
5.0000 mg | INTRAVENOUS | Status: DC | PRN
Start: 2014-07-24 — End: 2014-07-25
  Administered 2014-07-25: 3 mg via INTRAVENOUS
  Administered 2014-07-25: 2 mg via INTRAVENOUS
  Filled 2014-07-24 (×3): qty 20

## 2014-07-24 MED ORDER — MORPHINE SULFATE 2 MG/ML IJ SOLN
1.0000 mg | INTRAMUSCULAR | Status: DC | PRN
  Administered 2014-07-24 (×2): 2 mg via INTRAVENOUS
  Filled 2014-07-24 (×2): qty 1

## 2014-07-24 MED ORDER — BISACODYL 10 MG RE SUPP
10.0000 mg | Freq: Once | RECTAL | Status: AC
  Administered 2014-07-24: 10 mg via RECTAL
  Filled 2014-07-24: qty 1

## 2014-07-24 MED ORDER — ONDANSETRON HCL 4 MG/2ML IJ SOLN: 4.0000 mg | Freq: Four times a day (QID) | INTRAMUSCULAR | Status: DC | PRN

## 2014-07-24 MED ORDER — DEXTROSE 5 % IV SOLN
1.0000 mg/h | INTRAVENOUS | Status: DC
  Administered 2014-07-24: 13 mg/h via INTRAVENOUS
  Filled 2014-07-24 (×2): qty 10

## 2014-07-24 MED ORDER — SODIUM CHLORIDE 0.9 % IV SOLN
10.0000 mg/h | INTRAVENOUS | Status: DC
  Administered 2014-07-24: 10 mg/h via INTRAVENOUS

## 2014-07-24 NOTE — Clinical Social Work Note (Signed)
Clinical Social Worker continues to follow patient for disposition. Patient noted to have a Palliative Care consult. [facility] once medically [facility] however SNF cannot accept patient if he requires ongoing TPN.   Derenda FennelBashira Jaye Polidori, MSW, LCSWA [phone] 07/15/2014 2:39 PM

## 2014-07-24 NOTE — Progress Notes (Signed)
PT Cancellation Note  Patient Details Name: Mark Day MRN: [id number] DOB: 08/03/1923   Cancelled Treatment:    Reason Eval/Treat Not Completed: Medical issues which prohibited therapy (pt intubated with medical decline after therapy eval. Will sign off and await reorder for resumption of therapy as medically appropriate)   Toney Sangabor, Keileigh Vahey Beth 07/23/2014, 8:16 AM Delaney MeigsMaija Tabor Larkin Morelos, PT [phone]

## 2014-07-24 NOTE — Progress Notes (Signed)
eLink Physician-Brief Progress Note Patient Name: Mark Day DOB: 02/12/1923 MRN: [id number]   Date of Service  07/01/2014  HPI/Events of Note  Camera rounds> notable dyspnea, increased work of breathing Per RN was NTS suctioned one hour ago with morphine Chart reviewed, one way extubation, plan comfort measures if fails  eICU Interventions  Chest PT with vest now, NTS if needed Increase dose of morphine intermittent (was only receiving 1mg ) RN to report progress post vest physiotherapy     Intervention Category Major Interventions: Respiratory failure - evaluation and management  MCQUAID, DOUGLAS 06/28/2014, 4:24 PM

## 2014-07-24 NOTE — Procedures (Signed)
Extubation Procedure Note  Patient Details:   Name: Mark Day DOB: 03/26/1923 MRN: [id number]   Airway Documentation:  AIRWAYS 7.5 mm (Active)  Secured at (cm) 23 cm 07/20/2014 12:00 AM    Evaluation  O2 sats: stable throughout Complications: No apparent complications Patient did tolerate procedure well. Bilateral Breath Sounds: Clear, Diminished Suctioning: Airway Yes   Patient extubated to 4lnc. Vital signs stable at this time. No complications. Patient tolerated well. RN at bedside. RT will continue to monitor.  Ave Filterdkins, Sintia Mckissic Williams 06/30/2014, 11:13 AM

## 2014-07-24 NOTE — Progress Notes (Signed)
eLink Physician-Brief Progress Note Patient Name: Mark Day DOB: 02/08/1923 MRN: [id number]   Date of Service  07/15/2014  HPI/Events of Note  Called by nurse, worsening respiratory distress Received chest pt and NTS as well as morphine 4mg  but still suffering, actively dying  eICU Interventions  Add morphine gtt for comfort I have updated family through camera and they agree with this plan of care     Intervention Category Major Interventions: End of life / care limitation discussion;Respiratory failure - evaluation and management  Mark Day 07/07/2014, 5:12 PM

## 2014-07-24 NOTE — Progress Notes (Signed)
PARENTERAL NUTRITION CONSULT NOTE - FOLLOW UP  Pharmacy Consult:  TPN Indication: SBO  No Active Allergies  Patient Measurements: Height: 6' 1"  (185.4 cm) Weight: 154 lb 8.7 oz (70.1 kg) IBW/kg (Calculated) : 79.9  Vital Signs: Temp: 100.3 F (37.9 C) (11/27 0800) Temp Source: Core (Comment) (11/27 0400) BP: 98/58 mmHg (11/27 0800) Pulse Rate: 98 (11/27 0800) Intake/Output from previous day: 11/26 0701 - 11/27 0700 In: 2774.9 [I.V.:163.4; NG/GT:90; IV Piggyback:412.5; TPN:2109] Out: 2330 [Urine:2290; Emesis/NG output:40]  Labs:  Recent Labs  07/22/14 0315 07/22/14 0730 07/23/14 0420 06/30/2014 0430  WBC  --  9.7  --   --   HGB  --  11.2*  --   --   HCT  --  33.5*  --   --   PLT  --  240  --   --   INR 1.27  --  1.33 1.24     Recent Labs  07/22/14 0315 07/23/14 0420 07/23/14 2235 07/12/2014 0430  NA 130* 133*  --  135*  K 3.8 3.7  --  4.1  CL 92* 95*  --  97  CO2 28 27  --  26  GLUCOSE 144* 126*  --  128*  BUN 27* 31*  --  32*  CREATININE 0.82 0.93  --  0.91  CALCIUM 8.5 8.7  --  8.8  MG  --  1.9  --   --   PHOS 2.7 2.6  --   --   PROT  --  5.5*  --   --   ALBUMIN  --  2.0*  --   --   AST  --  30  --   --   ALT  --  21  --   --   ALKPHOS  --  190*  --   --   BILITOT  --  2.1*  --   --   TRIG  --   --  76  --    Estimated Creatinine Clearance: 54.6 mL/min (by C-G formula based on Cr of 0.91).    Recent Labs  07/23/14 2312 07/23/2014 0343 07/03/2014 0722  GLUCAP 130* 139* 129*     Insulin Requirements in the past 24 hours:  6 units sensitive SSI  Assessment: 89 YOM presented to [facility] with GIB and after the bleeding stopped, he was unable to pass flatus or have a BM.  CT and XR showed SBO and patient was transferred to [facility] for further care.  He continues on TPN for nutritional support.  GI: chronic diarrhea admitted with SBO, baseline prealbumin low at 8.4 >> now 8.1.  On IV PPI and scheduled Zofran.   SBO resolving.  Pallcare consulted. Endo: no  hx DM - CBGs adequately controlled Lytes: low Na/CL, others WNL Renal:SCr normal and stable, BUN WNL - good UOP 1.4 ml/kg/hr on IV Lasix Pulm:intubated, FiO2 30% Cards:HTN / CM / ICD / AFib / sHF (EF 50-55%) - BP soft, tachycardia - Lasix, IV Lopressor AC: full-dose Lovenox for Afib while Coumadin on hold, mild anemia, plts WNL - INR 1.27 Hepatobil: LFTs WNL except alk phos and tibili - no documentation of jaundice.  TG WNL. Neuro: s/p fall with broken neck, (+)epidural hematoma. (+)thoracic spine fractures.- s/p surgery 9/15 at [facility] & wearing extensive cervical collar.  Episode of convulsive syncope on 11/20. 11/23 EEG negative for epileptiform.  Off Propofol, using PRN Fentanyl for sedation ID: MRSA PNA - Vanc D#4, Tmax 101.1, WBC normalized Best Practices: MC, Lovenox,  CHG/mupirocin TPN Access:PICC TPN day#:9 (11/19 >> )  Current Nutrition:  Clinimix E 5/15 at 83 mL/hr + 20% IVFE at 24m/hr = 1894 kCal and 100gm protein  Nutritional Goals:  1800-1900 kCal, 95-110 grams of protein per day   Plan:  - Continue Clinimix E 5/15 at 83 mL/hr and 20% IVFE to 153mhr.  TPN providing 1894 kcal and 100g protein - Daily multivitamin and trace elements.  Watch tibili. - Continue sensitive SSI - F/U AM labs (CMET, Mag, Phos)  MiLegrand ComoPharm.D., BCPS, AAHIVP Clinical Pharmacist Phone: [phone]/27/2015, 10:03 AM

## 2014-07-24 NOTE — Progress Notes (Addendum)
Patient ZO:XWRUEA:Liberty Lynne      DOB: 09/27/1922      VWU:[id number]   [facility]    Subjective: Extubated today. Lots of coughing. Continues to have copious secretions. Denies pain. Does not remember being on ventilator.      Filed Vitals:   07/22/2014 0800  BP: 98/58  Pulse: 98  Temp: 100.3 F (37.9 C)  Resp: 20   Physical exam: GEN: alert, NAD, extubated CV: RRR Lungs: scattered coarse sounds Abd: soft, +BS EXT: no edema Skin: warm/dry  CBC    Component Value Date/Time   WBC 9.7 07/22/2014 0730   RBC 3.56* 07/22/2014 0730   HGB 11.2* 07/22/2014 0730   HCT 33.5* 07/22/2014 0730   PLT 240 07/22/2014 0730   MCV 94.1 07/22/2014 0730   MCH 31.5 07/22/2014 0730   MCHC 33.4 07/22/2014 0730   RDW 15.9* 07/22/2014 0730   LYMPHSABS 1.2 07/20/2014 0510   MONOABS 1.6* 07/20/2014 0510   EOSABS 0.0 07/20/2014 0510   BASOSABS 0.0 07/20/2014 0510    CMP     Component Value Date/Time   NA 135* 07/15/2014 0430   K 4.1 07/05/2014 0430   CL 97 06/30/2014 0430   CO2 26 07/21/2014 0430   GLUCOSE 128* 07/08/2014 0430   BUN 32* 07/15/2014 0430   CREATININE 0.91 07/04/2014 0430   CALCIUM 8.8 07/16/2014 0430   PROT 5.5* 07/23/2014 0420   ALBUMIN 2.0* 07/23/2014 0420   AST 30 07/23/2014 0420   ALT 21 07/23/2014 0420   ALKPHOS 190* 07/23/2014 0420   BILITOT 2.1* 07/23/2014 0420   GFRNONAA 73* 07/27/2014 0430   GFRAA 85* 07/03/2014 0430   11/26 KUB IMPRESSION: Resolving partial small bowel obstruction with distal progression of contrast and decreasing diameter of small bowel loops.   11/27 CXR IMPRESSION: 1. Stable line and tube positions. 2. Stable bibasilar infiltrates and/or atelectasis. 3. Stable cardiomegaly, normal pulmonary vascularity. Cardiac pacer in stable position.     Assessment and plan: 80 yo male with recent T/C-spine fracture, CHF, HTN, Afib who presented with persistent partial SBO complicated by  acute resp failure with suspected aspiration PNA   1. GOC/Code status-  Spoke extensively with son/wife today.  Discussed that he was able to be extubated and hopes that he will continue to do well from resp standpoint but at risk for further resp decline. I told them that my concern would be if he failed extubation, this would be poor prognosticator for how he would do overall (especially in light of GOC given meeting with Dr Phillips OdorGolding, decision to turn off AICD). They agree that this would likely mean he was not doing well and that no re-intubation makes sense.  I also talked a little with Maisie Fushomas about this, but I don't think he has great understanding right after extubation. He seems to assent however with family.  They still continue to hope for recovery of his partial SBO.  KUB yesterday did show some improvement and abdominal exam may be showing signs of improvement.  Of course still high risk for complications/decline during hospitalization.   2. Symptom Management  A) Increased oropharyngeal secretions- I doubt robinul will do much to prevent secretions and may even make harder to clear.  Agree with this being d/c'd.  Would continue to suction and keep HOB elevated.   Total Time: 45 minutes  >50% of time spent in counseling and coordination of care regarding above as well as discussion with ICU team.  Doran Clay D.O. Palliative Medicine Team at [facility]  Pager: [phone] Team Phone: [phone]

## 2014-07-24 NOTE — Progress Notes (Signed)
Patient NT suctioned X3. Copious amount of white, clear secretions obtained. Patient tolerated well. RN and family at bedside. Vital signs stable at this time. RT will continue to monitor.

## 2014-07-24 NOTE — Progress Notes (Signed)
CRITICAL VALUE ALERT  Critical value received:  Resp Culture positive for MRSA  Date of notification:  07/07/2014   Time of notification:  0842  Critical value read back:Yes.    Nurse who received alert:  Ulis Rias Destiny Trickey RN  MD notified (1st page):  Simonds  Time of first page:  Sitting beside me  MD notified (2nd page):  Time of second page:  Responding MD:  Sung AmabileSimonds  Time MD responded:  [phone]

## 2014-07-24 NOTE — Progress Notes (Addendum)
PULMONARY / CRITICAL CARE MEDICINE   Name:Mark Day WUJ:[id number] DOB:05/25/1923   ADMISSION DATE: 07/17/2014 CONSULTATION DATE: 07/20/2014  REFERRING MD : Butler Denmarkizwan  CHIEF COMPLAINT: SBO  INITIAL PRESENTATION: 80 y.o. M admitted 11/12 with SBO after recent fall, C-spine/T-spine fx, Treated conservatively with TPN. Developed acute resp failure due to inability to handle secretions and intubated 11/23. CCS plans surgery 11/24. Palliative Care involved  SIGNIFICANT STUDIES/EVENTS: [facility] CT abd/pelv:  changes c/w SBO, changes highly suggestive of bilateral pulmonary emboli, multiple gallstones, chronic renal cystic change, diverticulosis. 11/12 CT head: acute sinusitis 11/19 - cards and general surgery consult 11/23- transfer to ICU 11/23 KUB: persistent low grade partial SBO 11/23 EEG: mildly abnormal, no epileptiform activity. 11/24 Palliative Care mtg with family: DNR if arrests, AICD turned off, no exp lap, cont vent (short term only) and medical support 11/26 Tolerating PSV wean. Abdominal exam remains unimpressive but decreased BS persist. KUB - improving partial SBO 11/27 Extubated. Tolerating well initially. Moderate secretions. PRN NTS ordered. Palliative Care has clarified with family no re-intubation  SUBJECTIVE:  RASS 0. + F/C. Passed SBT. Extubated. Tolerating initially. Marginal cough  VITAL SIGNS: Temp: [98.6 F (37 C)-99.7 F (37.6 C)] 98.6 F (37 C) (11/23 1400) Pulse Rate: [88-114] 105 (11/23 1400) Resp: [20-26] 22 (11/23 1400) BP: (112-140)/(70-81) 112/77 mmHg (11/23 1400) SpO2: [94 %-97 %] 96 % (11/23 1802) Weight: [67.1 kg (147 lb 14.9 oz)] 67.1 kg (147 lb 14.9 oz) (11/23 0549) HEMODYNAMICS:   VENTILATOR SETTINGS:   INTAKE / OUTPUT: Intake/Output   11/23 0701 - 11/24 0700  IV Piggyback 50  TPN 1024  Total Intake(mL/kg) 1074 (16)  Urine (mL/kg/hr) 200 (0.2)  Emesis/NG output  400 (0.5)  Stool  Total Output 600  Net +474     PHYSICAL EXAMINATION: General: NAD Neuro: RASS 0, MAEs, diffusely weak HEENT: chronic conjunctivits Cardiovascular: IRIR, rate better controlled, no M Lungs: scattered rhonchi. No wheezes Abdomen: Soft, minimally distended, NT, active BS  Ext: tr edema, warm  LABS: I have reviewed all of today's lab results. Relevant abnormalities are discussed in the A/P section  CXR: NSC bibasilar Atx   ASSESSMENT / PLAN:  PULMONARY A: Acute hypoxic resp failure - improving Suspected aspiration PNA P:  Cont vent support - settings reviewed and/or adjusted Wean in PSV mode as tolerated Cont vent bundle Daily SBT if/when meets criteria  CARDIOVASCULAR RUE PICC 11/19 >>> A:  CAF, rate controlled Ischemic CM - 11/22 echo EF 50-55% HTN P:  Metoprolol increased 11/25 Cont PRN metoprolol to maintain HR < 120/min Cont LMWH per pharmacy Resume warfarin when able to use gut  RENAL A:  Mild hyponatremia Mild hypokalemia, resolved P:  Monitor BMET intermittently Monitor I/Os Correct electrolytes as indicated  GASTROINTESTINAL A:  Prolonged SBO - clinically improving Duodenitis with bleeding, resolved Cholelithiasis P:  SUP: IV pantoprazole. Cont TPN Cont conservative mgmt Clamp OGT 11/26  HEMATOLOGIC A:  Chronic warfarin therapy for AF Mild anemia  P:  DVT px: full anticoagulation (enoxaparin) Monitor CBC intermittently Transfuse per usual ICU guidelines  INFECTIOUS A:  Fever, severe sepsis Suspected aspiration Resp culture positive for MRSA P:  All micro results reviewed  Pip-tazo 11/24 >> 11/27 Vanc 11/24  ENDOCRINE A:  TPN associated hyperglycemia P:  Cont SSI  NEUROLOGIC A:  Epidural hematoma Multiple C- and T- spine fractures  ICU associated discomfort, controlled P:  Low dose PRN fentanyl post extubation RASS goal 0  Family updated:  Family updated by  Dr Greig RightLampkin   CCM X 35 mins   Billy Fischeravid Simonds, MD ; [facility] [phone].  After 5:30 PM or weekends, call [phone]

## 2014-07-27 LAB — CULTURE, BLOOD (ROUTINE X 2)
Culture: NO GROWTH
Culture: NO GROWTH

## 2014-07-28 NOTE — Plan of Care (Signed)
Problem: Phase I Progression Outcomes Goal: Pain controlled with appropriate interventions Outcome: Completed/Met Date Met:  2014-08-07 Goal: Non-pain symptoms managed Outcome: Not Applicable Date Met:  90/47/53 Goal: Pharmacy Consult if indicated Outcome: Completed/Met Date Met:  07-Aug-2014 Goal: Oral assessment and care per protocol Outcome: Completed/Met Date Met:  08/07/2014 Goal: Voiding-avoid urinary catheter unless indicated Outcome: Not Applicable Date Met:  39/17/92 Goal: Swallow eval if indicated Outcome: Not Applicable Date Met:  17/83/75 Goal: Psychosocial & spiritual needs assessed Outcome: Completed/Met Date Met:  2014/08/07 Goal: Goals of care identified Outcome: Completed/Met Date Met:  2014-08-07  Problem: Phase II Progression Outcomes Goal: Pain within acceptable level for patient Outcome: Completed/Met Date Met:  07-Aug-2014 Goal: Non-pain symptoms managed Outcome: Completed/Met Date Met:  2014-08-07 Goal: Psychosocial & spiritual needs addressed Outcome: Completed/Met Date Met:  2014-08-07 Goal: OOB as tolerated unless otherwise ordered Outcome: Not Applicable Date Met:  42/37/02 Goal: Pt./family involved in care progression Outcome: Completed/Met Date Met:  2014-08-07 Goal: Pain controlled IV/PO/SL/SQ as appropriate Outcome: Completed/Met Date Met:  08-07-2014

## 2014-07-28 NOTE — Progress Notes (Addendum)
Patient expire at 0545 pronounce by Donivan Scullarla Sheina Mcleish RN and Leonie ManNancy Irish RN. Family was at the bedside. [id number] Dr. Delford FieldWright notified of that patient had expired. [id number] [facility] notified, was not candidate for organ donation. [id number] Bed placement notified that patient had expire. Family would like funeral to pick patient up in the room.Morphine drip 5ml wasted in the sink at the nurses station witness by Leonie ManNancy Irish RN.

## 2014-07-28 DEATH — deceased

## 2014-08-05 NOTE — Discharge Summary (Signed)
DEATH SUMMARY  DATE OF ADMISSION:  07/20/2014  DATE OF DISCHARGE/DEATH:    ADMISSION DIAGNOSES:   UGIB Hematemesis SBO Atrial fibrillation Cardiomyopathy AICD present Recent fall with multiple cervical and thoracic spine fracture Advanced age   DISCHARGE DIAGNOSES:   UGIB Hematemesis Prolonged SBO Atrial fibrillation Cardiomyopathy AICD present Recent fall with multiple cervical and thoracic spine fracture Advanced age Acute respiratory failure due to aspiration PNA Resp culture positive for MRSA Hypertension Hyponatremia Hypokalemia Duodenitis Cholelithiasis Mild anemia Severe sepsis TPN associated hyperglycemia Epidural hematoma Acute encephalopathy Proteinicalorie malnutrition  PRESENTATION:   [facility] the following HPI and the above admission diagnoses:  HPI: Mark Day is a 80 y.o. male with a [facility] October [facility] with treatment for cervical and thoracic fractures and epidural hematoma following a fall at home. He also has a history of chronic atrial fibrillation-on Coumadin, chronic systolic heart failure, status post ICD, and hypertension. [facility] with a report of nausea, vomiting, and possible hematemesis this morning. He was seen by nursing center physician, Dr. Leanord Hawkingobson, who thought the patient may have acute diverticulitis. Therefore, Augmentin was started. It is unclear if the patient actually received a dose. The patient endorses nausea and vomiting, but he believes that The "blood" was actually tomato juice he drank the night before. He denies abdominal pain, chest pain, chest congestion, or neck pain. He denies known black tarry stools or bright red blood per rectum.  In the ED, he was afebrile and hemodynamically stable. CT scan of his abdomen and pelvis revealed changes consistent with small bowel obstruction in the distal  jejunum/proximal ileum; changes highly suggestive of bilateral pulmonary emboli; multiple gallstones without complicating factors, and diverticulosis without diverticulitis. EKG revealed atrial fibrillation with a heart rate of 109 bpm and PVC. His INR was 3.03, PHO Maybee was 7.3, hemoglobin was 12.3, glucose 101, potassium of 3.5, and proBNP of 3979. He is being admitted for further evaluation and management.  HOSPITAL COURSE:   [facility]12 CT abd/pelv: changes c/w SBO, changes highly suggestive of bilateral pulmonary emboli, multiple gallstones, chronic renal cystic change, diverticulosis. 11/12 CT head: acute sinusitis 11/14 EGD: Scant amount of fresh blood noted in hypopharynx without bleeding lesion. This may be related to trauma from NG tube. Small sliding hiatal hernia. Bulbar duodenitis 11/19 Cards and general surgery consult 11/19 PICC placed and TPN initiated 11/23- transfer to ICU with respiratory distress due to aspiration PNA. Intubated 11/20 Neurology consult 11/23 KUB: persistent low grade partial SBO 11/23 EEG: mildly abnormal, no epileptiform activity. 11/24 Palliative Care mtg with family: DNR if arrests, AICD turned off, no exp lap, cont vent (short term only) and medical support 11/26 Tolerating PSV wean. Abdominal exam remains unimpressive but decreased BS persist. KUB - improving partial SBO 11/27 Extubated. Tolerating well initially. Moderate secretions. PRN NTS ordered. Palliative Care has clarified with family no re-intubation 11/27 over course of day, had progressive respiratory decline due to inability to handle secretions despite chest physiotherapy and NTS. Started on morphine to relieve distress.  11/28 Expired comfortably in early AM   Cause of death:  Aspiration PNA due to SBO with nausea and vomiting due to opioid analgesics  Contributing factors: Advanced age, Chronic AF, protein-calorie malntrition  Autopsy:  No   Billy Fischeravid Simonds,  MD;  PCCM service; Mobile [phone]

## 2014-09-21 ENCOUNTER — Ambulatory Visit: Payer: Medicare Other | Admitting: Internal Medicine

## 2015-04-23 IMAGING — CR DG UGI W/ SMALL BOWEL
1 series · 1 of 1 positions shown · non-contrast
Comparison: CT scan 07/09/2014

CLINICAL DATA: Small bowel obstruction.

EXAM:
UPPER GI SERIES WITH SMALL BOWEL FOLLOW-THROUGH
FLUOROSCOPY TIME:  2 min and 30 seconds
TECHNIQUE: Combined double contrast and single contrast upper GI series using
effervescent crystals, thick barium, and thin barium. Subsequently,
serial images of the small bowel were obtained including spot views
of the terminal ileum.

[ap portable]
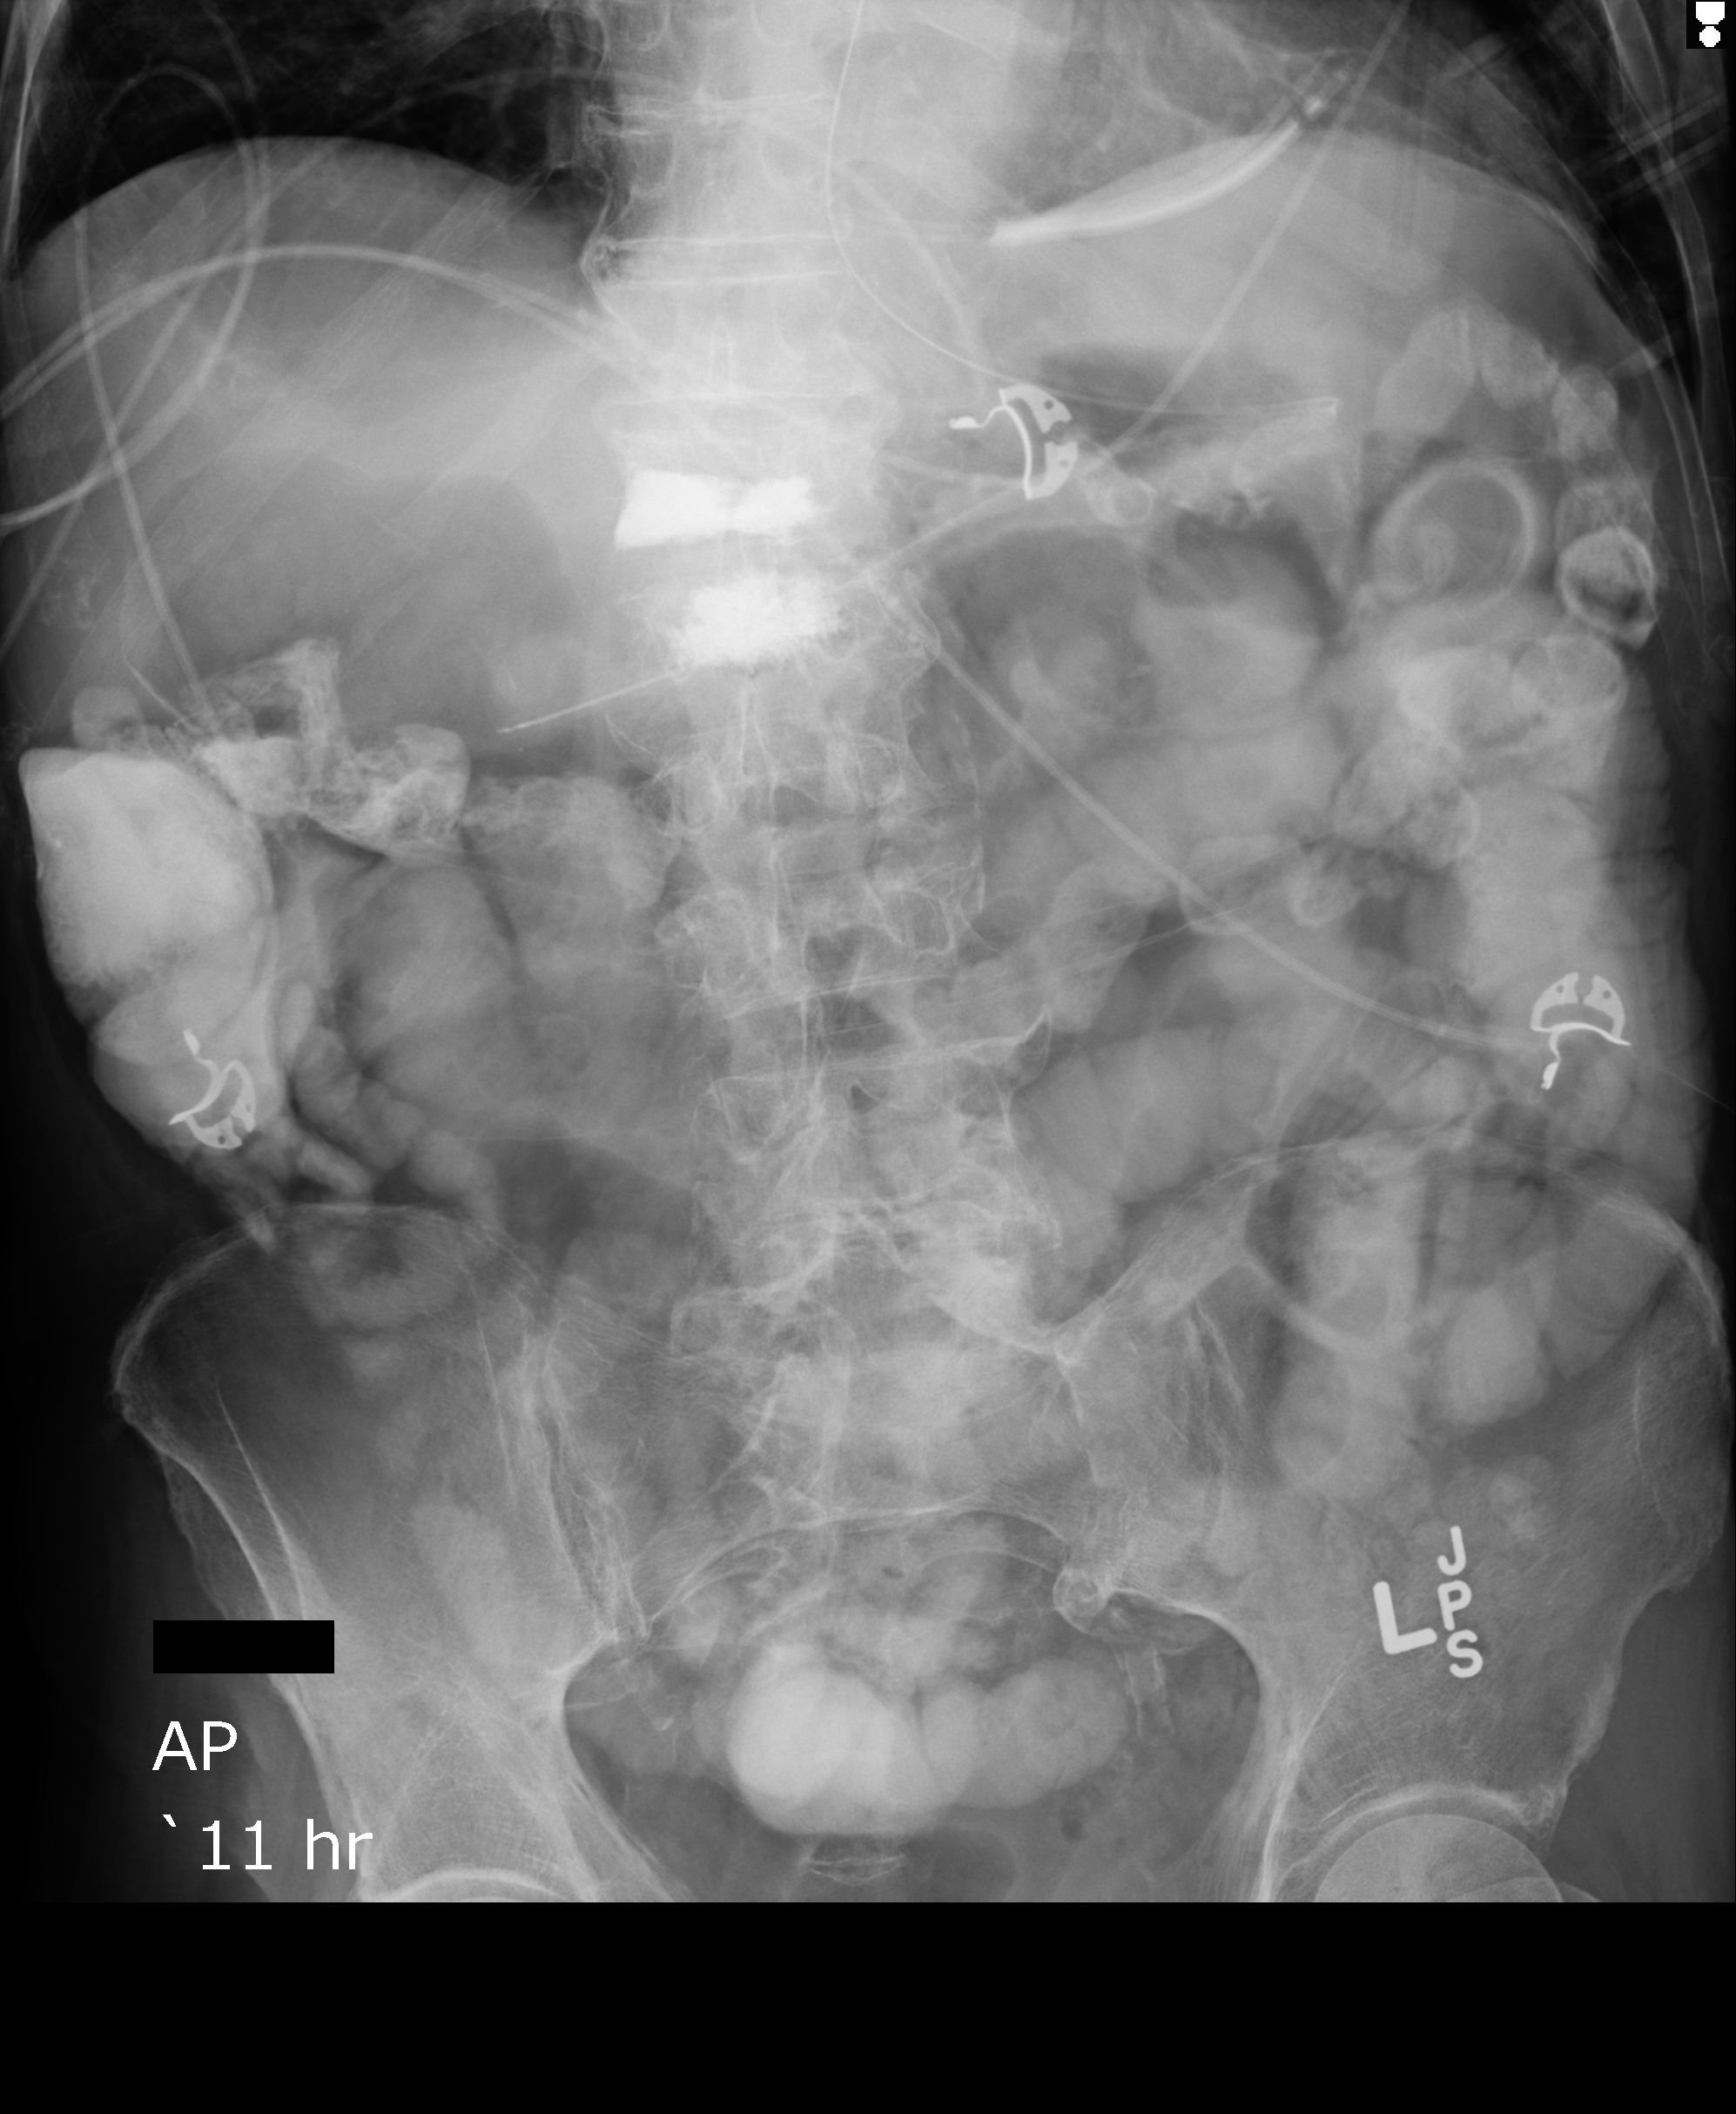

[1 of 1 positions shown; findings below may reference images not displayed]

FINDINGS: The existing NG tube was injected with Omnipaque 300. The stomach
appears normal. Normal emptying into a normal caliber duodenum. Slow
progression of contrast through the small bowel which is mildly
dilated. Transition from mildly dilated to nondilated/decompressed
small bowel noted in the mid pelvic area likely due to adhesions and
consistent with the prior CT findings. Contrast did reach the colon
at approximately 11 and 30 min.
IMPRESSION: Persistent partial small bowel obstruction as above.

## 2015-04-27 IMAGING — CR DG ABD PORTABLE 1V
1 series · 1 of 1 positions shown · non-contrast
Comparison: 07/20/2014

CLINICAL DATA: Small bowel obstruction

EXAM:
PORTABLE ABDOMEN - 1 VIEW

[supine ap]
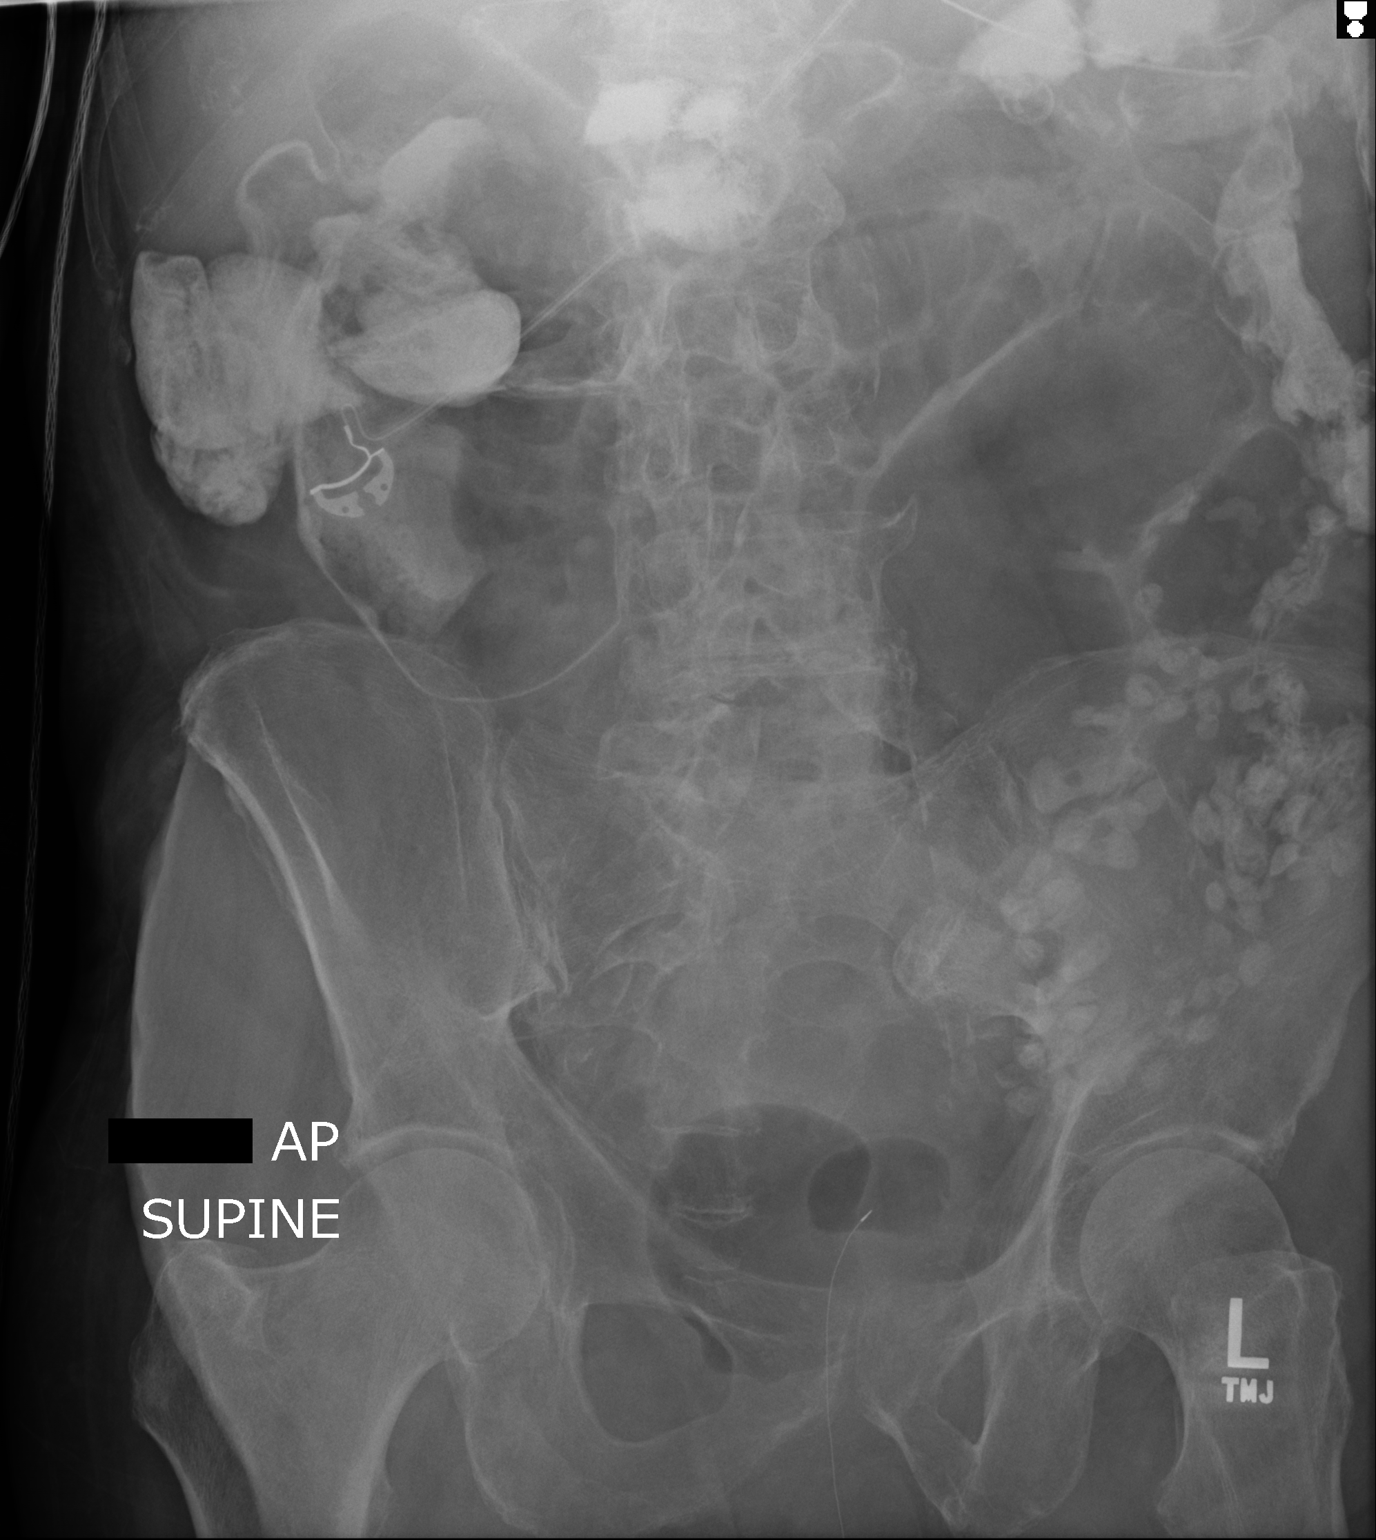

[1 of 1 positions shown; findings below may reference images not displayed]

FINDINGS: Contrast material within nondistended colon. Extensive
diverticulosis in the left colon. Continued mild dilatation of small
bowel loops in the mid abdomen suggesting continued partial small
bowel obstruction. No real change since prior study. No free air
organomegaly.
IMPRESSION: Continued partial small bowel obstruction pattern.

## 2015-04-29 IMAGING — CR DG CHEST 1V PORT
1 series · 1 of 1 positions shown · non-contrast
Comparison: Single view of the chest 07/22/2014 and 07/21/2014.

CLINICAL DATA: Small bowel obstruction.  Intubated patient.

EXAM:
PORTABLE CHEST - 1 VIEW

[AP]
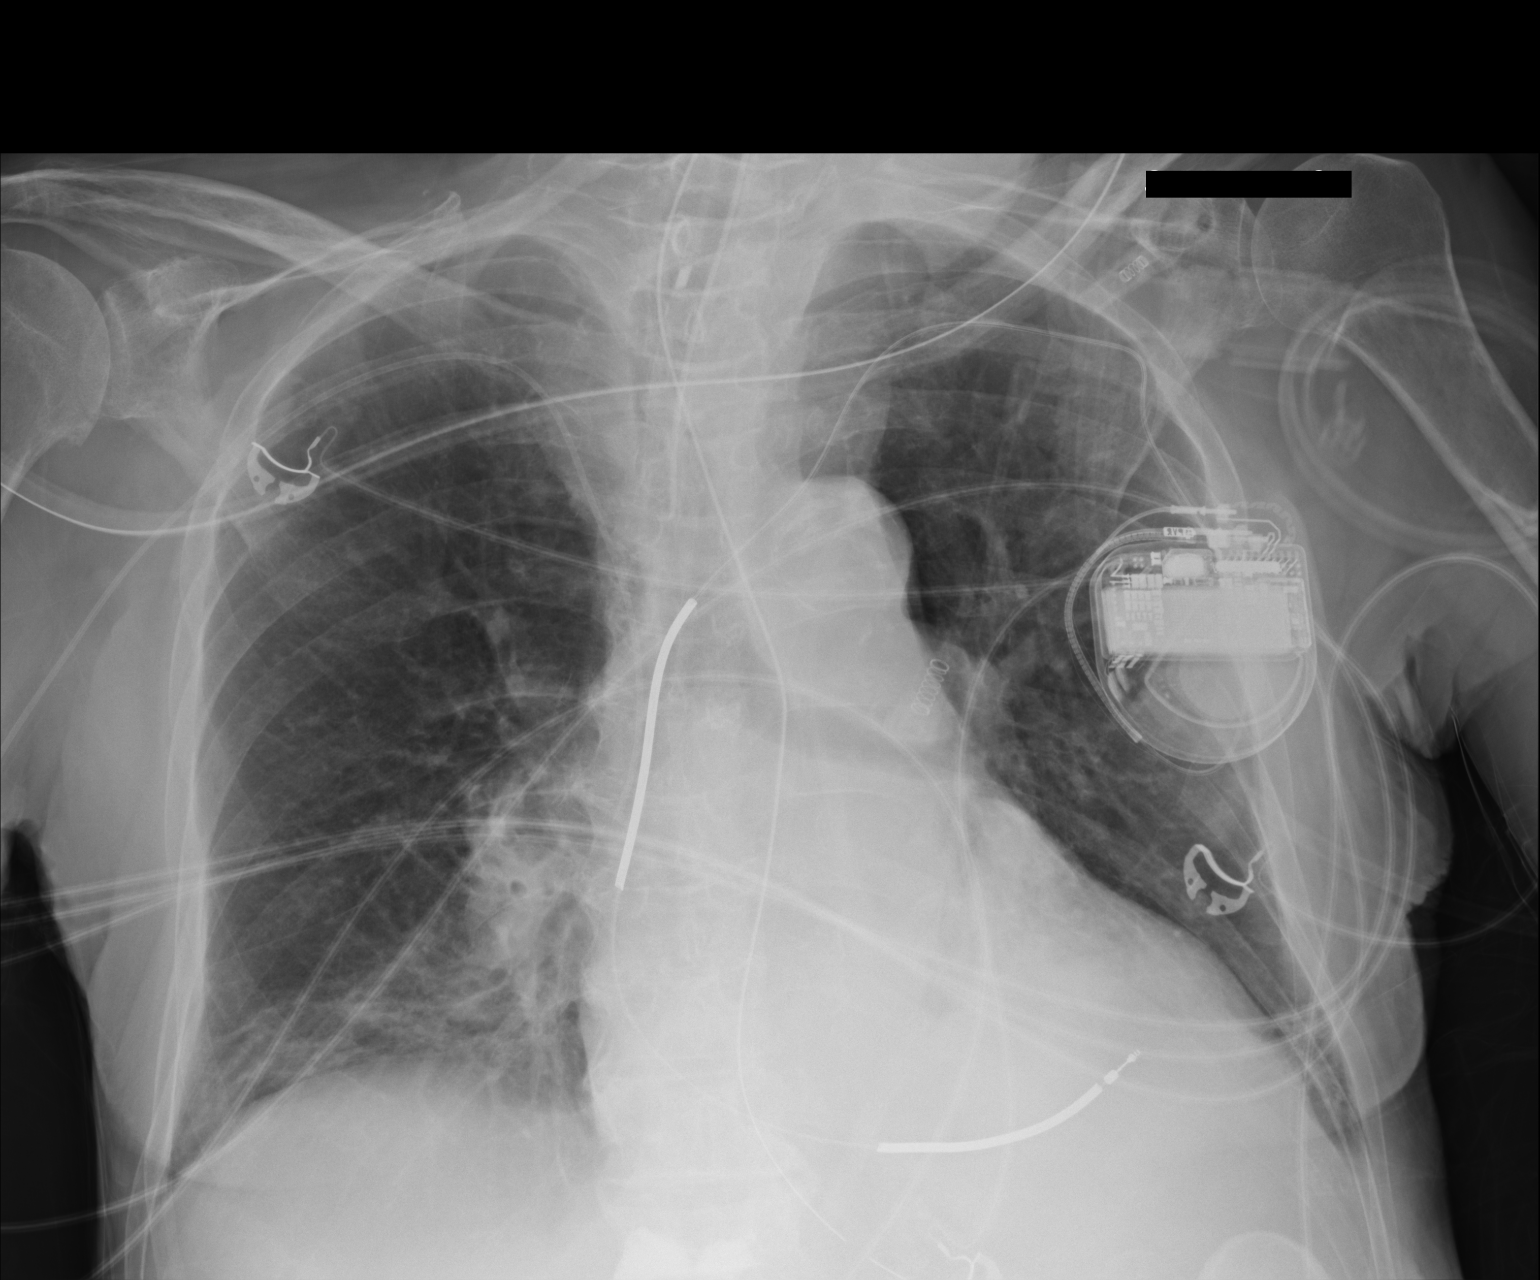

[1 of 1 positions shown; findings below may reference images not displayed]

FINDINGS: Support tubes and lines are unchanged. Bibasilar airspace disease
persists without change since yesterday's study. No pneumothorax
identified. Heart size is enlarged.
IMPRESSION: No change in streaky bibasilar airspace disease which could be
atelectasis or infection.

## 2015-04-29 IMAGING — CR DG ABD PORTABLE 1V
2 series · 2 of 2 positions shown · non-contrast
Comparison: [DATE].

CLINICAL DATA: Small bowel obstruction. Subsequent encounter.
Followup radiographs.

EXAM:
PORTABLE ABDOMEN - 1 VIEW

[AP (1 of 2)]
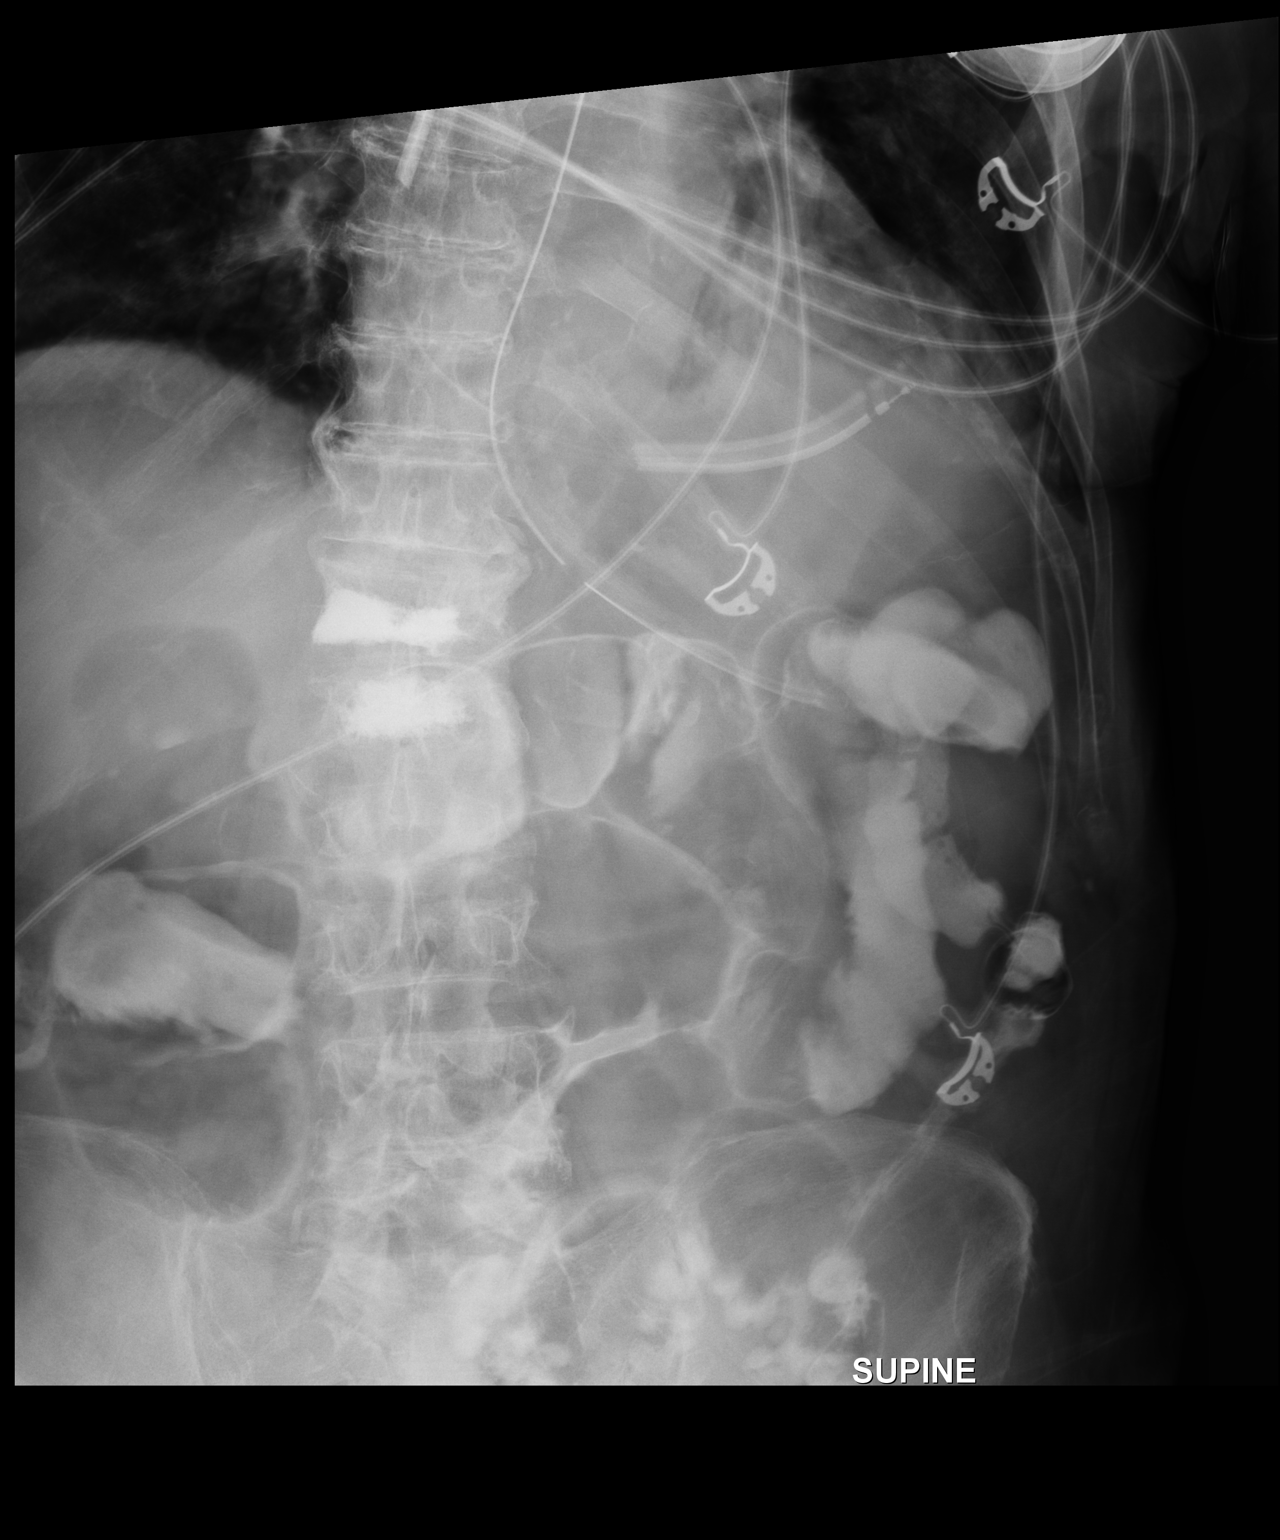

[AP (2 of 2)]
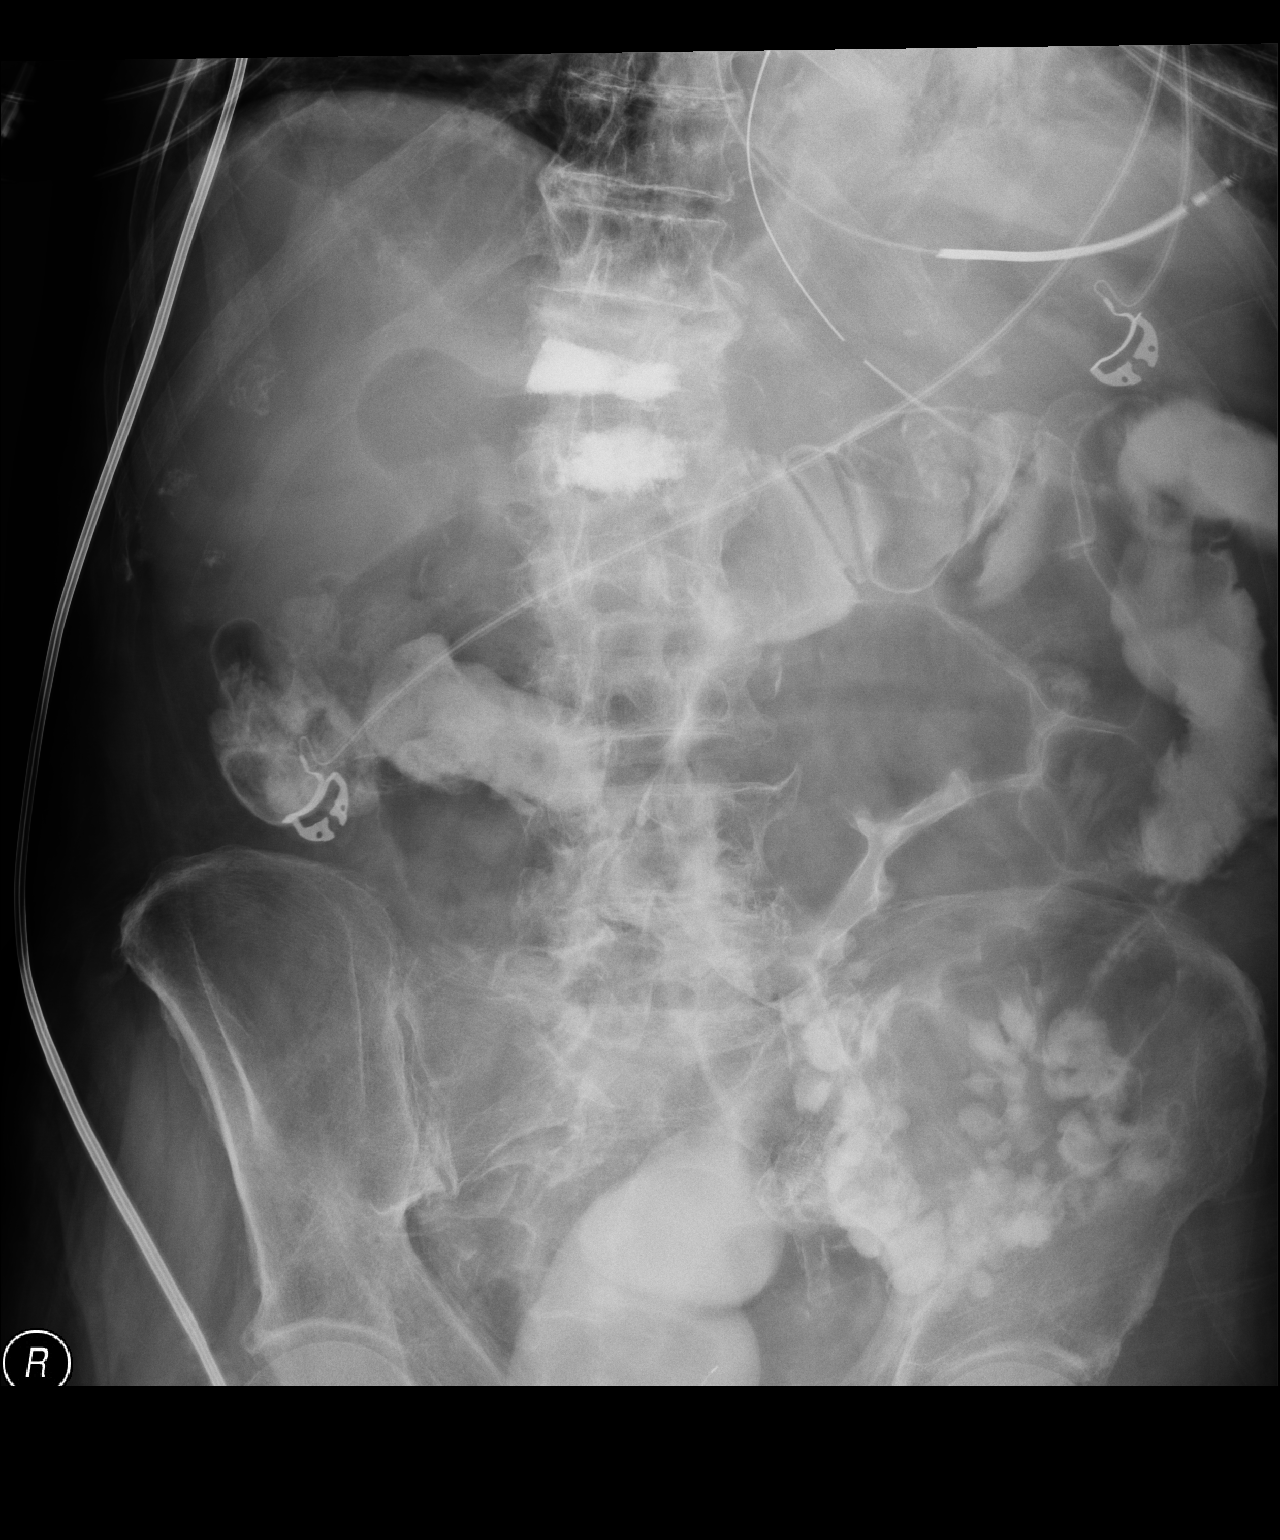

[2 of 2 positions shown; findings below may reference images not displayed]

FINDINGS: Distal progression of contrast. There is still gaseous distension of
bowel which primarily appears to involve colon. Contrast has
progressed into the rectosigmoid. No plain film evidence of free
air. Enteric tube is present within the stomach. AICD leads visible.
No dilated small bowel loops are evident on today's exam in the
gaseous distension appears to involve colon.
IMPRESSION: Resolving partial small bowel obstruction with distal progression of
contrast and decreasing diameter of small bowel loops.
